# Patient Record
Sex: Male | Born: 1940
Health system: Southern US, Community
[De-identification: ages and names within clinical notes are randomized; demographics above are authoritative.]

## PROBLEM LIST (undated history)

## (undated) DIAGNOSIS — I4891 Unspecified atrial fibrillation: Secondary | ICD-10-CM

## (undated) DIAGNOSIS — I10 Essential (primary) hypertension: Secondary | ICD-10-CM

## (undated) DIAGNOSIS — Z7901 Long term (current) use of anticoagulants: Secondary | ICD-10-CM

## (undated) DIAGNOSIS — D693 Immune thrombocytopenic purpura: Secondary | ICD-10-CM

## (undated) DIAGNOSIS — R0989 Other specified symptoms and signs involving the circulatory and respiratory systems: Secondary | ICD-10-CM

## (undated) DIAGNOSIS — I499 Cardiac arrhythmia, unspecified: Secondary | ICD-10-CM

## (undated) DIAGNOSIS — D696 Thrombocytopenia, unspecified: Secondary | ICD-10-CM

## (undated) DIAGNOSIS — I729 Aneurysm of unspecified site: Secondary | ICD-10-CM

## (undated) DIAGNOSIS — E119 Type 2 diabetes mellitus without complications: Secondary | ICD-10-CM

## (undated) DIAGNOSIS — F419 Anxiety disorder, unspecified: Secondary | ICD-10-CM

## (undated) DIAGNOSIS — Z9289 Personal history of other medical treatment: Secondary | ICD-10-CM

## (undated) DIAGNOSIS — Z87442 Personal history of urinary calculi: Secondary | ICD-10-CM

## (undated) HISTORY — DX: Aneurysm of unspecified site: I72.9

## (undated) HISTORY — DX: Thrombocytopenia, unspecified: D69.6

## (undated) HISTORY — PX: VASECTOMY: SHX75

## (undated) HISTORY — DX: Anxiety disorder, unspecified: F41.9

## (undated) HISTORY — DX: Immune thrombocytopenic purpura: D69.3

## (undated) HISTORY — DX: Long term (current) use of anticoagulants: Z79.01

## (undated) HISTORY — DX: Unspecified atrial fibrillation: I48.91

## (undated) HISTORY — PX: KNEE ARTHROSCOPY: SUR90

## (undated) HISTORY — DX: Essential (primary) hypertension: I10

## (undated) HISTORY — PX: CIRCUMCISION: SUR203

## (undated) HISTORY — DX: Other specified symptoms and signs involving the circulatory and respiratory systems: R09.89

## (undated) HISTORY — DX: Type 2 diabetes mellitus without complications: E11.9

## (undated) HISTORY — DX: Personal history of other medical treatment: Z92.89

---

## 2002-08-07 ENCOUNTER — Other Ambulatory Visit: Admission: RE | Admit: 2002-08-07 | Discharge: 2002-08-07 | Payer: Self-pay | Admitting: Dermatology

## 2003-01-14 ENCOUNTER — Ambulatory Visit (HOSPITAL_COMMUNITY): Admission: RE | Admit: 2003-01-14 | Discharge: 2003-01-14 | Payer: Self-pay | Admitting: *Deleted

## 2003-02-09 ENCOUNTER — Encounter (HOSPITAL_COMMUNITY): Admission: RE | Admit: 2003-02-09 | Discharge: 2003-03-11 | Payer: Self-pay | Admitting: *Deleted

## 2003-02-09 ENCOUNTER — Encounter: Payer: Self-pay | Admitting: *Deleted

## 2003-07-13 ENCOUNTER — Ambulatory Visit (HOSPITAL_COMMUNITY): Admission: RE | Admit: 2003-07-13 | Discharge: 2003-07-13 | Payer: Self-pay | Admitting: General Surgery

## 2004-02-01 ENCOUNTER — Emergency Department (HOSPITAL_COMMUNITY): Admission: EM | Admit: 2004-02-01 | Discharge: 2004-02-01 | Payer: Self-pay | Admitting: Emergency Medicine

## 2004-02-04 ENCOUNTER — Ambulatory Visit (HOSPITAL_COMMUNITY): Admission: RE | Admit: 2004-02-04 | Discharge: 2004-02-04 | Payer: Self-pay | Admitting: *Deleted

## 2004-08-04 ENCOUNTER — Ambulatory Visit (HOSPITAL_COMMUNITY): Admission: RE | Admit: 2004-08-04 | Discharge: 2004-08-04 | Payer: Self-pay | Admitting: Internal Medicine

## 2004-08-05 ENCOUNTER — Ambulatory Visit (HOSPITAL_COMMUNITY): Admission: RE | Admit: 2004-08-05 | Discharge: 2004-08-05 | Payer: Self-pay | Admitting: Internal Medicine

## 2005-05-03 ENCOUNTER — Ambulatory Visit: Payer: Self-pay | Admitting: *Deleted

## 2005-05-19 ENCOUNTER — Encounter (HOSPITAL_COMMUNITY): Admission: RE | Admit: 2005-05-19 | Discharge: 2005-06-18 | Payer: Self-pay | Admitting: Oncology

## 2005-05-19 ENCOUNTER — Ambulatory Visit (HOSPITAL_COMMUNITY): Payer: Self-pay | Admitting: Oncology

## 2005-05-19 ENCOUNTER — Encounter: Admission: RE | Admit: 2005-05-19 | Discharge: 2005-05-19 | Payer: Self-pay | Admitting: Oncology

## 2005-06-29 ENCOUNTER — Encounter (HOSPITAL_COMMUNITY): Admission: RE | Admit: 2005-06-29 | Discharge: 2005-06-29 | Payer: Self-pay | Admitting: Oncology

## 2005-06-29 ENCOUNTER — Encounter: Admission: RE | Admit: 2005-06-29 | Discharge: 2005-06-29 | Payer: Self-pay | Admitting: Oncology

## 2005-08-10 ENCOUNTER — Encounter: Admission: RE | Admit: 2005-08-10 | Discharge: 2005-08-10 | Payer: Self-pay | Admitting: Oncology

## 2005-08-10 ENCOUNTER — Encounter (HOSPITAL_COMMUNITY): Admission: RE | Admit: 2005-08-10 | Discharge: 2005-09-09 | Payer: Self-pay | Admitting: Oncology

## 2005-08-10 ENCOUNTER — Ambulatory Visit (HOSPITAL_COMMUNITY): Payer: Self-pay | Admitting: Oncology

## 2005-10-05 ENCOUNTER — Ambulatory Visit (HOSPITAL_COMMUNITY): Payer: Self-pay | Admitting: Oncology

## 2005-10-05 ENCOUNTER — Encounter (HOSPITAL_COMMUNITY): Admission: RE | Admit: 2005-10-05 | Discharge: 2005-11-04 | Payer: Self-pay | Admitting: Oncology

## 2005-10-05 ENCOUNTER — Encounter: Admission: RE | Admit: 2005-10-05 | Discharge: 2005-10-05 | Payer: Self-pay | Admitting: Oncology

## 2005-11-01 ENCOUNTER — Ambulatory Visit: Payer: Self-pay | Admitting: *Deleted

## 2005-11-30 ENCOUNTER — Encounter (HOSPITAL_COMMUNITY): Admission: RE | Admit: 2005-11-30 | Discharge: 2005-12-30 | Payer: Self-pay | Admitting: Oncology

## 2005-11-30 ENCOUNTER — Encounter: Admission: RE | Admit: 2005-11-30 | Discharge: 2005-11-30 | Payer: Self-pay | Admitting: Oncology

## 2005-12-01 ENCOUNTER — Ambulatory Visit (HOSPITAL_COMMUNITY): Payer: Self-pay | Admitting: Oncology

## 2006-04-12 ENCOUNTER — Ambulatory Visit (HOSPITAL_COMMUNITY): Admission: RE | Admit: 2006-04-12 | Discharge: 2006-04-12 | Payer: Self-pay | Admitting: Internal Medicine

## 2006-04-26 ENCOUNTER — Encounter: Payer: Self-pay | Admitting: Internal Medicine

## 2006-05-02 ENCOUNTER — Ambulatory Visit (HOSPITAL_COMMUNITY): Admission: RE | Admit: 2006-05-02 | Discharge: 2006-05-02 | Payer: Self-pay | Admitting: Interventional Radiology

## 2006-06-04 ENCOUNTER — Encounter (HOSPITAL_COMMUNITY): Admission: RE | Admit: 2006-06-04 | Discharge: 2006-07-04 | Payer: Self-pay | Admitting: Oncology

## 2006-06-04 ENCOUNTER — Ambulatory Visit (HOSPITAL_COMMUNITY): Payer: Self-pay | Admitting: Oncology

## 2006-06-04 ENCOUNTER — Encounter: Admission: RE | Admit: 2006-06-04 | Discharge: 2006-06-04 | Payer: Self-pay | Admitting: Oncology

## 2006-07-03 ENCOUNTER — Ambulatory Visit (HOSPITAL_COMMUNITY): Admission: RE | Admit: 2006-07-03 | Discharge: 2006-07-03 | Payer: Self-pay | Admitting: General Surgery

## 2006-07-03 ENCOUNTER — Encounter (INDEPENDENT_AMBULATORY_CARE_PROVIDER_SITE_OTHER): Payer: Self-pay | Admitting: *Deleted

## 2006-07-12 ENCOUNTER — Ambulatory Visit (HOSPITAL_COMMUNITY): Admission: RE | Admit: 2006-07-12 | Discharge: 2006-07-12 | Payer: Self-pay | Admitting: Internal Medicine

## 2006-11-29 ENCOUNTER — Encounter (HOSPITAL_COMMUNITY): Admission: RE | Admit: 2006-11-29 | Discharge: 2006-12-29 | Payer: Self-pay | Admitting: Oncology

## 2006-11-29 ENCOUNTER — Ambulatory Visit (HOSPITAL_COMMUNITY): Payer: Self-pay | Admitting: Oncology

## 2007-04-23 ENCOUNTER — Ambulatory Visit (HOSPITAL_COMMUNITY): Admission: RE | Admit: 2007-04-23 | Discharge: 2007-04-23 | Payer: Self-pay | Admitting: Internal Medicine

## 2007-05-28 ENCOUNTER — Encounter (HOSPITAL_COMMUNITY): Admission: RE | Admit: 2007-05-28 | Discharge: 2007-06-27 | Payer: Self-pay | Admitting: Oncology

## 2007-05-28 ENCOUNTER — Ambulatory Visit (HOSPITAL_COMMUNITY): Payer: Self-pay | Admitting: Oncology

## 2007-07-04 ENCOUNTER — Ambulatory Visit (HOSPITAL_COMMUNITY): Admission: RE | Admit: 2007-07-04 | Discharge: 2007-07-04 | Payer: Self-pay | Admitting: Internal Medicine

## 2007-11-26 ENCOUNTER — Encounter (HOSPITAL_COMMUNITY): Admission: RE | Admit: 2007-11-26 | Discharge: 2007-12-26 | Payer: Self-pay | Admitting: Oncology

## 2007-11-26 ENCOUNTER — Ambulatory Visit (HOSPITAL_COMMUNITY): Payer: Self-pay | Admitting: Oncology

## 2007-12-31 ENCOUNTER — Ambulatory Visit (HOSPITAL_COMMUNITY): Admission: RE | Admit: 2007-12-31 | Discharge: 2007-12-31 | Payer: Self-pay | Admitting: Cardiovascular Disease

## 2008-01-31 DIAGNOSIS — Z9289 Personal history of other medical treatment: Secondary | ICD-10-CM

## 2008-01-31 HISTORY — DX: Personal history of other medical treatment: Z92.89

## 2008-03-30 ENCOUNTER — Encounter (HOSPITAL_COMMUNITY): Admission: RE | Admit: 2008-03-30 | Discharge: 2008-04-29 | Payer: Self-pay | Admitting: Oncology

## 2008-03-30 ENCOUNTER — Ambulatory Visit (HOSPITAL_COMMUNITY): Payer: Self-pay | Admitting: Oncology

## 2008-04-15 ENCOUNTER — Ambulatory Visit (HOSPITAL_COMMUNITY): Admission: RE | Admit: 2008-04-15 | Discharge: 2008-04-15 | Payer: Self-pay | Admitting: Internal Medicine

## 2008-10-27 ENCOUNTER — Encounter (HOSPITAL_COMMUNITY): Admission: RE | Admit: 2008-10-27 | Discharge: 2008-11-26 | Payer: Self-pay | Admitting: Oncology

## 2008-10-27 ENCOUNTER — Ambulatory Visit (HOSPITAL_COMMUNITY): Payer: Self-pay | Admitting: Oncology

## 2009-04-13 ENCOUNTER — Encounter (HOSPITAL_COMMUNITY): Admission: RE | Admit: 2009-04-13 | Discharge: 2009-04-15 | Payer: Self-pay | Admitting: Oncology

## 2009-04-13 ENCOUNTER — Ambulatory Visit (HOSPITAL_COMMUNITY): Payer: Self-pay | Admitting: Oncology

## 2009-06-22 ENCOUNTER — Encounter (INDEPENDENT_AMBULATORY_CARE_PROVIDER_SITE_OTHER): Payer: Self-pay | Admitting: *Deleted

## 2009-06-22 ENCOUNTER — Ambulatory Visit (HOSPITAL_COMMUNITY): Admission: RE | Admit: 2009-06-22 | Discharge: 2009-06-22 | Payer: Self-pay | Admitting: General Surgery

## 2009-07-01 ENCOUNTER — Ambulatory Visit (HOSPITAL_COMMUNITY): Admission: RE | Admit: 2009-07-01 | Discharge: 2009-07-01 | Payer: Self-pay | Admitting: Urology

## 2009-10-11 ENCOUNTER — Ambulatory Visit (HOSPITAL_COMMUNITY): Payer: Self-pay | Admitting: Oncology

## 2009-10-11 ENCOUNTER — Encounter (HOSPITAL_COMMUNITY): Admission: RE | Admit: 2009-10-11 | Discharge: 2009-11-10 | Payer: Self-pay | Admitting: Oncology

## 2010-04-11 ENCOUNTER — Ambulatory Visit: Payer: Self-pay | Admitting: Oncology

## 2010-04-11 ENCOUNTER — Encounter (HOSPITAL_COMMUNITY)
Admission: RE | Admit: 2010-04-11 | Discharge: 2010-05-11 | Payer: Self-pay | Source: Home / Self Care | Admitting: Oncology

## 2010-04-11 ENCOUNTER — Ambulatory Visit (HOSPITAL_COMMUNITY): Payer: Self-pay | Admitting: Oncology

## 2010-04-12 ENCOUNTER — Ambulatory Visit (HOSPITAL_COMMUNITY): Payer: Self-pay | Admitting: Oncology

## 2010-06-13 ENCOUNTER — Encounter (INDEPENDENT_AMBULATORY_CARE_PROVIDER_SITE_OTHER): Payer: Self-pay | Admitting: *Deleted

## 2010-07-15 DIAGNOSIS — E119 Type 2 diabetes mellitus without complications: Secondary | ICD-10-CM | POA: Insufficient documentation

## 2010-07-15 DIAGNOSIS — R1319 Other dysphagia: Secondary | ICD-10-CM | POA: Insufficient documentation

## 2010-07-15 DIAGNOSIS — Z860100 Personal history of colon polyps, unspecified: Secondary | ICD-10-CM | POA: Insufficient documentation

## 2010-07-15 DIAGNOSIS — I10 Essential (primary) hypertension: Secondary | ICD-10-CM | POA: Insufficient documentation

## 2010-07-15 DIAGNOSIS — M1990 Unspecified osteoarthritis, unspecified site: Secondary | ICD-10-CM | POA: Insufficient documentation

## 2010-07-22 ENCOUNTER — Ambulatory Visit: Admit: 2010-07-22 | Payer: Self-pay | Admitting: Gastroenterology

## 2010-08-16 NOTE — Letter (Signed)
Summary: New Patient letter  [facility]  [address]   [place], [place] [address]   Phone: [phone]  Fax: [phone]       06/13/2010 MRN: [id number]  Jason Alvarez [address] [place], [place]  [address]  Dear Jason Alvarez,  Welcome to the Gastroenterology Division at [facility].    You are scheduled to see Dr. Jarold Motto on 07/22/2010 at 8:30 am on the 3rd floor at [facility], [address]  We ask that you try to arrive at our office 15 minutes prior to your appointment time to allow for check-in.  We would like you to complete the enclosed self-administered evaluation form prior to your visit and bring it with you on the day of your appointment.  We will review it with you.  Also, please bring a complete list of all your medications or, if you prefer, bring the medication bottles and we will list them.  Please bring your insurance card so that we may make a copy of it.  If your insurance requires a referral to see a specialist, please bring your referral form from your primary care physician.  Co-payments are due at the time of your visit and may be paid by cash, check or credit card.     Your office visit will consist of a consult with your physician (includes a physical exam), any laboratory testing he/she may order, scheduling of any necessary diagnostic testing (e.g. x-ray, ultrasound, CT-scan), and scheduling of a procedure (e.g. Endoscopy, Colonoscopy) if required.  Please allow enough time on your schedule to allow for any/all of these possibilities.    If you cannot keep your appointment, please call [phone] to cancel or reschedule prior to your appointment date.  This allows [place] the opportunity to schedule an appointment for another patient in need of care.  If you do not cancel or reschedule by 5 p.m. the business day prior to your appointment date, you will be charged a $50.00 late cancellation/no-show fee.    Thank you for choosing  [facility] for your medical needs.  We appreciate the opportunity to care for you.  Please visit [place] at our website  to learn more about our practice.                     Sincerely,                                                             The Gastroenterology Division

## 2010-09-29 LAB — CBC
Hemoglobin: 14.3 g/dL (ref 13.0–17.0)
MCH: 31.5 pg (ref 26.0–34.0)
MCHC: 34.6 g/dL (ref 30.0–36.0)
MCV: 91.2 fL (ref 78.0–100.0)

## 2010-10-09 LAB — CBC
MCHC: 35 g/dL (ref 30.0–36.0)
MCV: 90.3 fL (ref 78.0–100.0)
Platelets: 123 10*3/uL — ABNORMAL LOW (ref 150–400)
RDW: 13.2 % (ref 11.5–15.5)
WBC: 5.2 10*3/uL (ref 4.0–10.5)

## 2010-10-10 ENCOUNTER — Encounter (HOSPITAL_COMMUNITY): Payer: Medicare Other | Attending: Oncology

## 2010-10-10 ENCOUNTER — Other Ambulatory Visit (HOSPITAL_COMMUNITY): Payer: Medicare Other

## 2010-10-10 DIAGNOSIS — D693 Immune thrombocytopenic purpura: Secondary | ICD-10-CM

## 2010-10-17 LAB — CBC
Platelets: 117 10*3/uL — ABNORMAL LOW (ref 150–400)
RDW: 12.6 % (ref 11.5–15.5)
WBC: 4.3 10*3/uL (ref 4.0–10.5)

## 2010-10-17 LAB — COMPREHENSIVE METABOLIC PANEL
AST: 23 U/L (ref 0–37)
Albumin: 4 g/dL (ref 3.5–5.2)
Alkaline Phosphatase: 59 U/L (ref 39–117)
Chloride: 103 mEq/L (ref 96–112)
GFR calc Af Amer: >60 mL/min (ref >60)
Potassium: 4.6 mEq/L (ref 3.5–5.1)
Sodium: 144 mEq/L (ref 135–145)
Total Bilirubin: 1 mg/dL (ref 0.3–1.2)
Total Protein: 7.5 g/dL (ref 6.0–8.3)

## 2010-10-17 LAB — GLUCOSE, CAPILLARY
Glucose-Capillary: 110 mg/dL — ABNORMAL HIGH (ref 70–99)
Glucose-Capillary: 155 mg/dL — ABNORMAL HIGH (ref 70–99)
Glucose-Capillary: 158 mg/dL — ABNORMAL HIGH (ref 70–99)
Glucose-Capillary: 216 mg/dL — ABNORMAL HIGH (ref 70–99)

## 2010-10-17 LAB — PROTIME-INR: Prothrombin Time: 13.5 seconds (ref 11.6–15.2)

## 2010-10-18 LAB — CLOTEST (H. PYLORI), BIOPSY: Helicobacter screen: NEGATIVE

## 2010-10-21 LAB — CBC
HCT: 43.2 % (ref 39.0–52.0)
Hemoglobin: 14.9 g/dL (ref 13.0–17.0)
MCHC: 34.5 g/dL (ref 30.0–36.0)
Platelets: 127 10*3/uL — ABNORMAL LOW (ref 150–400)
RDW: 12.9 % (ref 11.5–15.5)

## 2010-10-26 LAB — CBC
Hemoglobin: 14.4 g/dL (ref 13.0–17.0)
MCHC: 34.7 g/dL (ref 30.0–36.0)
RDW: 13.1 % (ref 11.5–15.5)

## 2010-12-02 NOTE — H&P (Signed)
NAMEEDMUND, HOLCOMB               ACCOUNT NO.:  [id number]   MEDICAL RECORD NO.:  [id number]          PATIENT TYPE:  AMB   LOCATION:  DAY                           FACILITY:  [facility]   PHYSICIAN:  Dalia Heading, M.D.  DATE OF BIRTH:  1941-07-09   DATE OF ADMISSION:  07/03/2006  DATE OF DISCHARGE:  LH                              HISTORY & PHYSICAL   CHIEF COMPLAINT:  Colon polyp.   HISTORY OF PRESENT ILLNESS:  The patient is a 70 year old white male who  is referred for endoscopic evaluation.  He needs a follow-up colonoscopy  due to a history of colon polyps.  He had an ascending colon polyp in  2004, which was negative for malignancy.  No abdominal pain, weight  loss, nausea, vomiting, diarrhea, constipation, melena, or hematochezia  have been noted.   PAST MEDICAL HISTORY:  Hypertension and non-insulin-dependent diabetes  mellitus.  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   PAST SURGICAL HISTORY:  Unremarkable.   CURRENT MEDICATIONS:  Hydrochlorothiazide, Altace, atenolol, Xanax,  glipizide/metformin, baby aspirin (which he is holding).   ALLERGIES:  PENICILLIN.   REVIEW OF SYSTEMS:  Noncontributory.   PHYSICAL EXAMINATION:  GENERAL:  The patient is a well-developed, well-  nourished white male in no acute distress.  LUNGS:  Clear to auscultation with equal breath sounds bilaterally.  HEART:  A regular rate and rhythm without S3, S4, or murmurs.  ABDOMEN:  Soft, nontender, nondistended.  No hepatosplenomegaly or  masses are noted.  RECTAL:  Examination was deferred to the procedure.   IMPRESSION:  History of colon polyp.   PLAN:  The patient is scheduled for a colonoscopy on July 03, 2006.  The risks and benefits of the procedure including bleeding and  perforation were fully explained to the patient, who gave informed  consent.      Dalia Heading, M.D.  Electronically Signed     MAJ/MEDQ  D:   06/19/2006  T:  06/20/2006  Job:  [id number]   cc:   [facility]  Fax: [phone]   Madelin Rear. Sherwood Gambler, MD  Fax: [phone]

## 2010-12-02 NOTE — Procedures (Signed)
NAME:  Jason Alvarez, Jason Alvarez NO.:  [id number]   MEDICAL RECORD NO.:  [id number]                   PATIENT TYPE:  OUT   LOCATION:  RAD                                  FACILITY:  [facility]   PHYSICIAN:  Vida Roller, M.D.                DATE OF BIRTH:  [birth year]   DATE OF PROCEDURE:  02/04/2004  DATE OF DISCHARGE:                                  ECHOCARDIOGRAM   REFERRING PHYSICIAN:  Patrica Duel, M.D.   TAPE NUMBER:  [id number]   TAPE COUNT:  [phone]   CLINICAL INFORMATION:  [age]-year-old male with atrial fibrillation.   PREVIOUS STUDY:  No previous echo's.   TECHNICAL QUALITY:  Adequate.   M-MODE:  AORTA:  36 mm  (<4.0)  LEFT ATRIUM:  47 mm  (<4.0)  SEPTUM:  18 mm  (0.7-1.1)  POSTERIOR WALL:  16 mm  (0.7-1.1)  LV-DIASTOLE:  39 mm  (<5.7)  LV-SYSTOLE:  29 mm  (<4.0)  E-SEPTAL:  (<0.5)  RV-DIASTOLE:  (<2.5)  IVC:  (<2.0)   TWO-DIMENSIONAL AND DOPPLER IMAGING:  The left ventricle is normal size with  preserved left ventricular systolic function, estimated ejection fraction 55-  60%, there are no obvious wall motion abnormalities, there is moderate  concentric left ventricular hypertrophy with a slight predominance in the  septum.  Diastolic function was not assessed.   The right ventricle is normal size with normal systolic function.   Both atria appear to be enlarged, left greater than right; there is no  obvious atrial septal defect.   The aortic valve is sclerotic with no stenosis or regurgitation.   The mitral valve is morphologically unremarkable with trivial insufficiency,  no stenosis is seen.   The tricuspid valve is morphologically unremarkable with trivial  insufficiency, no stenosis is seen.   Pulmonic valve not well seen.   Assenting aorta not well seen.   Pericardial structures appear normal.   Inferior vena cava not well seen.      ___________________________________________     Vida Roller, M.D.   JH/MEDQ  D:  02/05/2004  T:  02/05/2004  Job:  [id number]

## 2010-12-02 NOTE — Consult Note (Signed)
NAME:  Jason Alvarez, Jason Alvarez               ACCOUNT NO.:  [id number]   MEDICAL RECORD NO.:  [id number]          PATIENT TYPE:  OUT   LOCATION:  XRAY                         FACILITY:  [facility]   PHYSICIAN:  Sanjeev K. Deveshwar, M.D.DATE OF BIRTH:  29-Oct-1940   DATE OF CONSULTATION:  04/26/2006  DATE OF DISCHARGE:                                   CONSULTATION   CHIEF COMPLAINT:  Abnormal MRI/MRA.   HISTORY OF PRESENT ILLNESS:  This is a very pleasant 70 year old male who  had been experiencing some problems with his balance.  He saw his primary  care physician, Dr. Sherwood Gambler, who ordered an MRI/MRA on April 06, 2006,  that showed a question of a basilar tip artery aneurysm.  This was performed  at the [facility].  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  The patient also had a carotid  ultrasound performed which showed less than 40% stenosis of the internal  carotid arteries bilaterally.  A cerebral angiogram has been recommended.  The patient presents today with his wife to discuss further recommendations.   PAST MEDICAL HISTORY:  1. Diabetes mellitus controlled with oral agents.  2. Hypertension.  3. He has a previous history of an episode of atrial fibrillation followed      by [facility], apparently he only had one episode and was not      treated with Coumadin.  4. He has a remote history gastroesophageal reflux disease.  5. He had a 2-D echo performed February 04, 2004, that revealed an ejection      fraction of 55-60% with moderate concentric LVH but no wall motion      abnormalities.  6. He has a longstanding history of thrombocytopenia followed by Dr.      Mariel Sleet.  This is still being evaluated and felt possibly secondary      to medications.   PAST SURGICAL HISTORY:  1. The patient had a vasectomy approximately 30 years ago.  2. He had a  surgical procedure for a renal calculi.  3. He has had arthroscopic knee surgery.  He denies problems with anesthesia.   ALLERGIES:  He states he is allergic to PENICILLIN and intolerant to Actos.   CURRENT MEDICATIONS:  Hydrochlorothiazide, Altace, atenolol, aspirin 81 mg  daily, Xanax and a combination of glipizide and metformin for his diabetes.   SOCIAL HISTORY:  The patient is married.  He has two grown children.  [place]  He does not use alcohol or tobacco.  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   FAMILY HISTORY:  His mother died in her 67s from melanoma.  His father died  in his 15s from prostate CA.  He had a brother who died from lung cancer in  his 67s.   IMPRESSION AND PLAN:  Dr. Corliss Skains reviewed the results of the patient's  recent MRI/MRA of the brain as well as the MRA of the neck.  He went over  the images with the patient and his wife.  He pointed out the areas of  concern.  As noted, there were two different problems, one being a possible  basilar tip aneurysm, the other being a stenosis of the vertebrobasilar  junction.  Dr. Corliss Skains told the patient and his wife that he could not be  sure of the severity of either issue until a cerebral angiogram had been  performed.  Treatment options were discussed including continued monitoring  with conservative measures or cerebral angiogram and possible intervention.  The procedures were described in detail along with the risks and benefits.  The patient and his wife would like to proceed with a cerebral angiogram for  further diagnostic purposes, followed by intervention at a later date if  felt to be safe and indicated.  We will schedule the patient for his  cerebral angiogram  Wednesday, May 02, 2006.   Greater than 1 hour was spent on this consult.  All of their questions were  answered.  They have a good understanding of the situation.  There were also  given some written  material to review at home.  The patient was told to hold  his glipizide and metformin the morning of the angiogram and he was to hold  the metformin for 2 days afterwards as well.      Delton See, P.A.    ______________________________  Grandville Silos. Corliss Skains, M.D.    DR/MEDQ  D:  04/26/2006  T:  04/27/2006  Job:  [id number]

## 2010-12-02 NOTE — Procedures (Signed)
   NAME:  Jason Alvarez, Jason Alvarez NO.:  [id number]   MEDICAL RECORD NO.:  [id number]                   PATIENT TYPE:   LOCATION:                                       FACILITY:   PHYSICIAN:  Vida Roller, M.D.                DATE OF BIRTH:   DATE OF PROCEDURE:  DATE OF DISCHARGE:                                    STRESS TEST   EXERCISE CARDIOLITE:  Jason Alvarez is a 70 year old male with no known  coronary artery disease, with history of mildly abnormal EKG with stress  test approximately nine years ago.  His cardiac risk factors include  diabetes, hypertension, male sex, age.   BASELINE DATA:  Resting heart rate was 112 beats per minute; resting blood  pressure is 172/90.   Patient exercised for a total of 6 minutes in Bruce protocol, stage I.  Stage was felt secondary to elevated blood pressure.  METS achieved were  4.6.  Maximum heart rate was 155 beats per minute, which is 98% of predicted  maximum.  Maximum blood pressure was 218/88.  This resolved down to 174/80  in recovery.  Test was stopped secondary to elevated systolic blood pressure  at 218.   EKG showed no ischemic changes and few PVCs.   Final images and results are pending M.D. review.     Amy Mercy Riding, P.A. [facility]                     Vida Roller, M.D.    AB/MEDQ  D:  02/09/2003  T:  02/09/2003  Job:  [id number]

## 2010-12-02 NOTE — H&P (Signed)
NAME:  Jason Alvarez, Jason Alvarez NO.:  [id number]   MEDICAL RECORD NO.:  [id number]                  PATIENT TYPE:   LOCATION:                                       FACILITY:   PHYSICIAN:  Dalia Heading, M.D.               DATE OF BIRTH:  Jun 28, 1941   DATE OF ADMISSION:  DATE OF DISCHARGE:                                HISTORY & PHYSICAL   CHIEF COMPLAINT:  Hematochezia.   HISTORY OF PRESENT ILLNESS:  Patient is a 70 year old white male who is  referred for endoscopic evaluation.  He needs a colonoscopy due to a history  of intermittent hematochezia.  No abdominal pain, weight loss, nausea,  vomiting, diarrhea, constipation, or melena have been noted.  He had never  had a colonoscopy.  There is no family history of colon carcinoma.  There is  a questionable history of hemorrhoidal disease.   PAST MEDICAL HISTORY:  Includes hypertension and noninsulin dependent  diabetes mellitus.   PAST SURGICAL HISTORY:  Unremarkable.   CURRENT MEDICATIONS:  Blood pressure pill, hydrochlorothiazide, atenolol,  Metaglip, Xanax.   ALLERGIES:  NO KNOWN DRUG ALLERGIES.   REVIEW OF SYSTEMS:  Noncontributory.   PHYSICAL EXAMINATION:  Patient is a well-developed, well-nourished white  male in no acute distress.  He is afebrile.  Vital signs are stable.  LUNGS:  Clear to auscultation with equal breath sounds bilaterally.  HEART EXAMINATION:  Reveals a regular rate and rhythm without S3, S4, or  murmurs.  ABDOMEN:  Soft, nontender, nondistended.  No hepatosplenomegaly, masses, or  hernias are identified.  RECTAL EXAMINATION:  Deferred due to the procedure.   IMPRESSION:  Hematochezia.   PLAN:  The patient is scheduled for a colonoscopy on July 13, 2003.  The  risks and benefits of the procedure including bleeding and perforation were  fully explained to the patient, gave informed consent.  A CoLyte preparation  has been prescribed.     ___________________________________________                                         Dalia Heading, M.D.   MAJ/MEDQ  D:  07/07/2003  T:  07/07/2003  Job:  [id number]   cc:   Patrica Duel, M.D.  [address], [address]  [place]  [place] [address]  Fax: [phone]

## 2011-04-10 ENCOUNTER — Encounter (HOSPITAL_COMMUNITY): Payer: Medicare Other | Attending: Oncology

## 2011-04-10 DIAGNOSIS — D696 Thrombocytopenia, unspecified: Secondary | ICD-10-CM

## 2011-04-10 DIAGNOSIS — E119 Type 2 diabetes mellitus without complications: Secondary | ICD-10-CM | POA: Insufficient documentation

## 2011-04-10 DIAGNOSIS — D693 Immune thrombocytopenic purpura: Secondary | ICD-10-CM | POA: Insufficient documentation

## 2011-04-10 LAB — CBC
Hemoglobin: 14 g/dL (ref 13.0–17.0)
Platelets: 122 10*3/uL — ABNORMAL LOW (ref 150–400)
RBC: 4.53 MIL/uL (ref 4.22–5.81)
WBC: 5.1 10*3/uL (ref 4.0–10.5)

## 2011-04-10 LAB — DIFFERENTIAL
Lymphocytes Relative: 43 % (ref 12–46)
Lymphs Abs: 2.2 10*3/uL (ref 0.7–4.0)
Monocytes Relative: 10 % (ref 3–12)
Neutro Abs: 2.2 10*3/uL (ref 1.7–7.7)
Neutrophils Relative %: 42 % — ABNORMAL LOW (ref 43–77)

## 2011-04-10 NOTE — Progress Notes (Signed)
Labs drawn today for cbc/diff 

## 2011-04-12 ENCOUNTER — Encounter (HOSPITAL_COMMUNITY): Payer: Self-pay | Admitting: Oncology

## 2011-04-12 ENCOUNTER — Encounter (HOSPITAL_BASED_OUTPATIENT_CLINIC_OR_DEPARTMENT_OTHER): Payer: Medicare Other | Admitting: Oncology

## 2011-04-12 VITALS — BP 188/83 | HR 61 | Temp 97.5°F | Ht 75.0 in | Wt 234.0 lb

## 2011-04-12 DIAGNOSIS — D696 Thrombocytopenia, unspecified: Secondary | ICD-10-CM

## 2011-04-12 DIAGNOSIS — D693 Immune thrombocytopenic purpura: Secondary | ICD-10-CM

## 2011-04-12 DIAGNOSIS — Z7901 Long term (current) use of anticoagulants: Secondary | ICD-10-CM

## 2011-04-12 NOTE — Progress Notes (Signed)
This office note has been dictated.

## 2011-04-12 NOTE — Patient Instructions (Signed)
[facility]  Discharge Instructions  RECOMMENDATIONS MADE BY THE CONSULTANT AND ANY TEST RESULTS WILL BE SENT TO YOUR REFERRING DOCTOR.   EXAM FINDINGS BY MD TODAY AND SIGNS AND SYMPTOMS TO REPORT TO CLINIC OR PRIMARY MD: Doing well.  Let us know if you are going to see MD about your varicose vein  MEDICATIONS PRESCRIBED: none     SPECIAL INSTRUCTIONS/FOLLOW-UP: Lab work Needed in 6 months and to see Dr. Lytle Butte in 1 year.   I acknowledge that I have been informed and understand all the instructions given to me and received a copy. I do not have any more questions at this time, but understand that I may call the Specialty Clinic at [facility] at [phone] during business hours should I have any further questions or need assistance in obtaining follow-up care.    __________________________________________  _____________  __________ Signature of Patient or Authorized Representative            Date                   Time    __________________________________________ Nurse's Signature

## 2011-04-12 NOTE — Progress Notes (Signed)
CC:   Jason Alvarez. Sherwood Gambler, MD Nanetta Batty, M.D.  DIAGNOSES: 1. Low grade thrombocytopenia consistent with chronic ITP not in need     of therapy. 2. Atrial fibrillation on Coumadin. 3. Hypertension. 4. Diabetes mellitus type 2. 5. Varicose veins on the right leg which are painful and quite     significantly at times. 6. Chronic anxiety on p.r.n. Xanax. 7. 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     Corliss Skains in the past.  [name]'s platelets are very, very stable still.  He has been in the 115- 140,000 range since I met him.  The earliest platelet count that is low that I have is from July 2005 and it is the same then as is now essentially.  So the ranges remain the same.  Hemoglobin, hematocrit white count perfectly normal.  Differential unremarkable.  He is really complaining of pain in the right leg from the varicosities. I examined his leg.  He has a significant of course group of varicose veins on the right leg above and below the knee.  These are very tender he states at times.  He has no petechiae on his legs.  No petechiae intraorally and no petechiae conjunctivally.  So what we talked about was getting a consultation with Dr. Jerilee Field who he actually knows due to his mother-in-law who was just recently operated on by Dr. Hart Rochester and had a very good outcome.  So I think a consultation with him would be reasonable for [name] and he will think about it and either have Dr. Sherwood Gambler make that referral or just call in himself and he can use my name.  We will see him in 6 months and 12 months for CBC.  I will see him in a year, sooner if need be, but right now he needs no therapy and has not ever needed therapy.    ______________________________ Ladona Horns. Mariel Sleet, MD ESN/MEDQ  D:  04/12/2011  T:  04/12/2011  Job:  [id number]

## 2011-04-19 LAB — DIFFERENTIAL
Eosinophils Absolute: 0.3
Lymphs Abs: 1.5
Monocytes Absolute: 0.4
Monocytes Relative: 8
Neutrophils Relative %: 52

## 2011-04-19 LAB — CBC
HCT: 39.3
Hemoglobin: 13.7
MCHC: 34.9
MCV: 90.8
RBC: 4.32
WBC: 4.5

## 2011-04-25 LAB — CBC
MCHC: 33.7
MCV: 90.5
RBC: 4.71
RDW: 13.1

## 2011-08-21 ENCOUNTER — Encounter (HOSPITAL_COMMUNITY): Payer: Self-pay | Admitting: Pharmacy Technician

## 2011-08-29 ENCOUNTER — Encounter (HOSPITAL_COMMUNITY)
Admission: RE | Disposition: A | Discharge status: Home / Self Care | Payer: Self-pay | Source: Ambulatory Visit | Attending: Ophthalmology

## 2011-08-29 ENCOUNTER — Ambulatory Visit (HOSPITAL_COMMUNITY)
Admission: RE | Admit: 2011-08-29 | Discharge: 2011-08-29 | Disposition: A | Discharge status: Home / Self Care | Payer: Medicare Other | Source: Ambulatory Visit | Attending: Ophthalmology | Admitting: Ophthalmology

## 2011-08-29 DIAGNOSIS — H26499 Other secondary cataract, unspecified eye: Secondary | ICD-10-CM | POA: Insufficient documentation

## 2011-08-29 SURGERY — TREATMENT, USING YAG LASER
Anesthesia: LOCAL | Site: Eye | Laterality: Left

## 2011-08-29 MED ORDER — CYCLOPENTOLATE-PHENYLEPHRINE 0.2-1 % OP SOLN
OPHTHALMIC | Status: AC
  Administered 2011-08-29: 1 [drp] via OPHTHALMIC
  Filled 2011-08-29: qty 2

## 2011-08-29 MED ORDER — CYCLOPENTOLATE-PHENYLEPHRINE 0.2-1 % OP SOLN
1.0000 [drp] | OPHTHALMIC | Status: AC
  Administered 2011-08-29 (×3): 1 [drp] via OPHTHALMIC

## 2011-08-29 NOTE — Discharge Instructions (Signed)
[name]  08/29/2011     Instructions    Activity: No Restrictions.   Diet: Resume Diet you were on at home.   Pain Medication: Tylenol if Needed.   CONTACT YOUR DOCTOR IF YOU HAVE PAIN, REDNESS IN YOUR EYE, OR DECREASED VISION.   Follow-up:today between 1 and 2pm with Mark T. Nile Riggs, MD.   Dr. Lahoma Crocker: [phone]  Dr. Lita Mains: [phone]  Dr. Alto Denver: [phone]   If you find that you cannot contact your physician, but feel that your signs and   Symptoms warrant a physician's attention, call the Emergency Room at   [phone].   Othern/a.

## 2011-08-29 NOTE — Brief Op Note (Signed)
ATTILA MCCARTHY 08/29/2011  Laurianne Floresca T. Nile Riggs, MD  Yag Laser Self Test Completedyes. Procedure: Posterior Capsulotomy, left eye.  Eye Protection Worn by Staff yes. Laser In Use Sign on Door yes.  Laser: Nd:YAG Spot Size: Fixed Burst Mode: III Power Setting: 3.4 mJ/burst  Number of shots: 24 Total energy delivered:80.0 mJ  Patency of the peripheral iridotomy was confirmed visually.  The patient tolerated the procedure without difficulty. No complications were encountered.    The patient was discharged home with the instructions to continue all his current glaucoma medications, if any.   Patient instructed to go to office at 0100 for intraocular pressure check.  Patient verbalizes understanding of discharge instructions yes.

## 2011-08-29 NOTE — H&P (Signed)
The patient was re examined and there is no change in the patients condition since the original H and P. 

## 2011-09-08 ENCOUNTER — Encounter: Payer: Self-pay | Admitting: Vascular Surgery

## 2011-09-11 ENCOUNTER — Encounter (INDEPENDENT_AMBULATORY_CARE_PROVIDER_SITE_OTHER): Payer: 59 | Admitting: *Deleted

## 2011-09-11 ENCOUNTER — Encounter: Payer: Self-pay | Admitting: Vascular Surgery

## 2011-09-11 ENCOUNTER — Ambulatory Visit (INDEPENDENT_AMBULATORY_CARE_PROVIDER_SITE_OTHER): Payer: 59 | Admitting: Vascular Surgery

## 2011-09-11 VITALS — BP 196/76 | HR 59 | Resp 20 | Ht 75.0 in | Wt 226.0 lb

## 2011-09-11 DIAGNOSIS — I83893 Varicose veins of bilateral lower extremities with other complications: Secondary | ICD-10-CM

## 2011-09-11 NOTE — Progress Notes (Signed)
Subjective:     Patient ID: Jason Alvarez, male   DOB: [birth year], 71 y.o.   MRN: [id number]  HPI this 71 year old male patient was self-referred for severe venous insufficiency of the right leg. He has had bulging varicosities in the medial thigh and calf with severe skin changes for the last 20-30 years which have become progressively worse. He noticed a swelling in the right leg as the day progresses. He is not wear elastic compression stockings nor elevate his legs nor take pain medication. He has had no history of stasis ulcers thrombophlebitis or DVT. He develops a heavy aching discomfort as the day progresses accompanied with the edema. Noticed the skin becoming more dark over the last year or 2.  Past Medical History  Diagnosis Date  . Thrombocytopenia   . ITP (idiopathic thrombocytopenic purpura)   . HTN (hypertension)   . DM (diabetes mellitus)   . A-fib   . Chronic anticoagulation   . Aneurysm   . Anxiety     History  Substance Use Topics  . Smoking status: Never Smoker   . Smokeless tobacco: Never Used  . Alcohol Use: No    Family History  Problem Relation Age of Onset  . Prostate cancer Father   . Cancer Sister     breast  . Cancer Brother     lung    Allergies  Allergen Reactions  . Penicillins Other (See Comments)    Childhood allergy    Current outpatient prescriptions:ALPRAZolam (XANAX) 1 MG tablet, Take 1 mg by mouth as needed. For anxiety, Disp: , Rfl: ;  glipiZIDE-metformin (METAGLIP) 5-500 MG per tablet, Take 1 tablet by mouth 2 (two) times daily before a meal.  , Disp: , Rfl: ;  hydrochlorothiazide (HYDRODIURIL) 25 MG tablet, Take 25 mg by mouth daily.  , Disp: , Rfl:  HYDROcodone-acetaminophen (VICODIN) 5-500 MG per tablet, Take 1 tablet by mouth every 8 (eight) hours as needed. For pain, Disp: , Rfl: ;  metoprolol succinate (TOPROL-XL) 50 MG 24 hr tablet, Take 50 mg by mouth every evening., Disp: , Rfl: ;  ramipril (ALTACE) 5 MG tablet, Take 5-10 mg by  mouth 2 (two) times daily. 10 mg in the am & 5 mg in the pm, Disp: , Rfl:  warfarin (COUMADIN) 5 MG tablet, Take 5-7.5 mg by mouth daily. 7.5 mg daily for 5 days a week and 5 mg on Mon and Fri, Disp: , Rfl:   BP 196/76  Pulse 59  Resp 20  Ht 6\' 3"  (1.905 m)  Wt 226 lb (102.513 kg)  BMI 28.25 kg/m2  Body mass index is 28.25 kg/(m^2).         Review of Systems     Objective:   Physical Exam blood pressure 196/76 heart rate 59 respirations 20 Gen.-alert and oriented x3 in no apparent distress HEENT normal for age Lungs no rhonchi or wheezing Cardiovascular regular rhythm no murmurs carotid pulses 3+ palpable no bruits audible Abdomen soft nontender no palpable masses Musculoskeletal free of  major deformities Skin clear -no rashes Neurologic normal Lower extremities 3+ femoral and dorsalis pedis pulses palpable bilaterally . The left leg has multiple bulging varicosities in the medial thigh over about an 8-10 cm in diameter area. There is hyperpigmentation and bulging varicosities from the knee to the ankle. There is one to 2+ edema skin is very thickened and scaly in appearance. There is no active ulceration noted. Right lower extremity is free of varicosities but does  have some diffuse reticular veins in the lower third of the leg  Today I ordered a venous duplex exam of the right leg which I reviewed and interpreted. There is gross reflux in the right great saphenous vein supplying these varicosities and there is no DVT.    Assessment:     Severe venous insufficiency right leg with bulging varicosities secondary to gross reflux right great saphenous vein and the 2 valvular incompetence. Severe skin changes affecting his daily living.    Plan:     #1 [place] elastic compression stockings 20-30 mm gradient #2 a light legs as much as possible #3 ibuprofen on a daily basis #4 patient is on chronic warfarin for intermittent atrial fib. #5 return in 3 months. Patient will  benefit from laser ablation right great saphenous vein and greater than 20 stab phlebectomy. If not dramatic improvement he will need this in the near future following his return appointment

## 2011-09-18 NOTE — Procedures (Unsigned)
LOWER EXTREMITY VENOUS REFLUX EXAM  INDICATION:  Varicose veins with other complication, patient complains of right lower extremity swelling.  EXAM:  Using color-flow imaging and pulse Doppler spectral analysis, the right common femoral, femoral, popliteal, posterior tibial, great and small saphenous veins were evaluated.  There is evidence suggesting deep venous reflux of >500 milliseconds noted throughout the right lower extremity.  The right saphenofemoral junction and nontortuous greater saphenous vein demonstrate reflux of >500 milliseconds with calibers as described below.   The right proximal small saphenous vein demonstrates competency.  GSV Diameter (used if found to be incompetent only)                                           Right    Left Proximal Greater Saphenous Vein           0.77 cm  cm Proximal-to-mid-thigh                     cm       cm Mid thigh                                 0.71 cm  cm Mid-distal thigh                          cm       cm Distal thigh                              0.40 cm  cm Knee                                      0.52 cm  cm  IMPRESSION:  Right great saphenous vein and deep system reflux is noted, as described above.  ___________________________________________ Quita Skye. Hart Rochester, M.D.  CH/MEDQ  D:  09/11/2011  T:  09/11/2011  Job:  [id number]

## 2011-10-10 ENCOUNTER — Other Ambulatory Visit (HOSPITAL_COMMUNITY): Payer: Medicare Other

## 2011-10-23 ENCOUNTER — Encounter (HOSPITAL_COMMUNITY): Payer: Medicare Other | Attending: Oncology

## 2011-10-23 DIAGNOSIS — D693 Immune thrombocytopenic purpura: Secondary | ICD-10-CM

## 2011-10-23 DIAGNOSIS — D696 Thrombocytopenia, unspecified: Secondary | ICD-10-CM | POA: Insufficient documentation

## 2011-10-23 LAB — CBC
MCH: 30.4 pg (ref 26.0–34.0)
MCV: 90.2 fL (ref 78.0–100.0)
Platelets: 124 10*3/uL — ABNORMAL LOW (ref 150–400)
RDW: 12.6 % (ref 11.5–15.5)
WBC: 4.6 10*3/uL (ref 4.0–10.5)

## 2011-10-23 NOTE — Progress Notes (Signed)
Labs drawn today for cbc 

## 2012-04-08 ENCOUNTER — Encounter (HOSPITAL_COMMUNITY): Payer: 59 | Attending: Oncology

## 2012-04-08 DIAGNOSIS — I83893 Varicose veins of bilateral lower extremities with other complications: Secondary | ICD-10-CM | POA: Insufficient documentation

## 2012-04-08 DIAGNOSIS — D696 Thrombocytopenia, unspecified: Secondary | ICD-10-CM | POA: Insufficient documentation

## 2012-04-08 DIAGNOSIS — I4891 Unspecified atrial fibrillation: Secondary | ICD-10-CM | POA: Insufficient documentation

## 2012-04-08 DIAGNOSIS — E119 Type 2 diabetes mellitus without complications: Secondary | ICD-10-CM | POA: Insufficient documentation

## 2012-04-08 DIAGNOSIS — I10 Essential (primary) hypertension: Secondary | ICD-10-CM | POA: Insufficient documentation

## 2012-04-08 LAB — CBC
Hemoglobin: 13.9 g/dL (ref 13.0–17.0)
MCHC: 33.6 g/dL (ref 30.0–36.0)
RDW: 12.7 % (ref 11.5–15.5)
WBC: 3.5 10*3/uL — ABNORMAL LOW (ref 4.0–10.5)

## 2012-04-10 ENCOUNTER — Encounter (HOSPITAL_BASED_OUTPATIENT_CLINIC_OR_DEPARTMENT_OTHER): Payer: 59 | Admitting: Oncology

## 2012-04-10 ENCOUNTER — Encounter (HOSPITAL_COMMUNITY): Payer: Self-pay | Admitting: Oncology

## 2012-04-10 ENCOUNTER — Other Ambulatory Visit (HOSPITAL_COMMUNITY): Payer: 59

## 2012-04-10 VITALS — BP 197/69 | HR 56 | Temp 97.5°F | Resp 18 | Wt 232.2 lb

## 2012-04-10 DIAGNOSIS — D696 Thrombocytopenia, unspecified: Secondary | ICD-10-CM

## 2012-04-10 DIAGNOSIS — F411 Generalized anxiety disorder: Secondary | ICD-10-CM

## 2012-04-10 DIAGNOSIS — D693 Immune thrombocytopenic purpura: Secondary | ICD-10-CM | POA: Insufficient documentation

## 2012-04-10 DIAGNOSIS — I671 Cerebral aneurysm, nonruptured: Secondary | ICD-10-CM

## 2012-04-10 HISTORY — DX: Immune thrombocytopenic purpura: D69.3

## 2012-04-10 NOTE — Progress Notes (Signed)
Cassell Smiles., MD [address] [place] [address]  1. Chronic ITP (idiopathic thrombocytopenia)     CURRENT THERAPY: Observation with lab work.  INTERVAL HISTORY: Jason Alvarez 71 y.o. male returns for  regular  visit for followup of Low grade thrombocytopenia consistent with chronic ITP not in need of therapy.    Odas's platelets are very, very stable still. He has been in the 109- 140,000 range since he presented to the clinic.  The earliest platelet count that is low that I have is from July 2005 and it is the same then as is now essentially. So the ranges remain the same. Hemoglobin and hematocrit are perfectly normal. Differential unremarkable.  He does have a mildly low WBC count on 04/08/2012.  Jason Alvarez is doing well.  He is on Coumadin for paryoxysmal atrial fibrillation.  He is having his INR monitored elsewhere.  Unfortunately, Jason Alvarez brother passed from pancreatic cancer in April 2013.  He reports that he has a strong family history of cancer and he was telling me briefly about it.  His sister has a low platelet count recently in the setting of a UTI and I provided him some education on that.  I personally reviewed and went over laboratory results with the patient.  Interestingly, his platelet count is minimally lower than it was in the past at 109,000.  His WBC count is low as well at 3.5.  I printed him a copy of his lab work so he can have it to go home with.  I explained to him some etiologies of these abnormalities including an asymptomatic infection.  He is very worried about his blood test results but I tried to ease his mind.    So we will perform a CBC diff, in 3 months followed by 6 months and 12 months.  I suspect his white count will normalize in the future.   Otherwise, he denies any bleeding.  He denies any rash or petechiae. Complete hematologic ROS questioning is negative.  He is following up with a neurologist at [facility]  regarding his aneurysm.  He has seen Dr. Hart Rochester regarding his varicose veins and it was recommended that he wear compression stockings.  He has not gotten them to date.   Past Medical History  Diagnosis Date  . Thrombocytopenia   . ITP (idiopathic thrombocytopenic purpura)   . HTN (hypertension)   . DM (diabetes mellitus)   . A-fib   . Chronic anticoagulation   . Aneurysm   . Anxiety   . Chronic ITP (idiopathic thrombocytopenia) 04/10/2012    Low grade thrombocytopenia consistent with chronic ITP not in need of therapy.     has DIABETES MELLITUS; HYPERTENSION; ATRIAL FIBRILLATION; OSTEOARTHRITIS; OTHER DYSPHAGIA; COLONIC POLYPS, HYPERPLASTIC, HX OF; Varicose veins of lower extremities with other complications; and Chronic ITP (idiopathic thrombocytopenia) on his problem list.     is allergic to penicillins.  Mr. Conde had no medications administered during this visit.  Past Surgical History  Procedure Date  . Knee arthroscopy     right and left knee/1992 and 2000  . Vasectomy   . Circumcision     Denies any headaches, dizziness, double vision, fevers, chills, night sweats, nausea, vomiting, diarrhea, constipation, chest pain, heart palpitations, shortness of breath, blood in stool, black tarry stool, urinary pain, urinary burning, urinary frequency, hematuria.   PHYSICAL EXAMINATION  ECOG PERFORMANCE STATUS: 0 - Asymptomatic  Filed Vitals:   04/10/12 0900  BP: 197/69  Pulse: 56  Temp: 97.5 F (36.4 C)  Resp: 18    GENERAL:alert, healthy, no distress, well nourished, well developed, anxious, comfortable, cooperative and smiling SKIN: skin color, texture, turgor are normal, no rashes or significant lesions HEAD: Normocephalic, No masses, lesions, tenderness or abnormalities EYES: normal, Conjunctiva are pink and non-injected EARS: External ears normal OROPHARYNX:lips, buccal mucosa, and tongue normal and mucous membranes are moist  NECK: supple, no adenopathy, thyroid  normal size, non-tender, without nodularity, no stridor, non-tender LYMPH:  no palpable lymphadenopathy, no hepatosplenomegaly BREAST:not examined LUNGS: clear to auscultation and percussion HEART: regular rate & rhythm, no murmurs, no gallops, S1 normal and S2 normal ABDOMEN:abdomen soft, non-tender, obese, normal bowel sounds, no masses or organomegaly and no hepatosplenomegaly BACK: Back symmetric, no curvature. EXTREMITIES:less then 2 second capillary refill, no joint deformities, effusion, or inflammation, no edema, no skin discoloration, no clubbing, no cyanosis  NEURO: alert & oriented x 3 with fluent speech, no focal motor/sensory deficits, gait normal    LABORATORY DATA: CBC    Component Value Date/Time   WBC 3.5* 04/08/2012 0828   RBC 4.53 04/08/2012 0828   HGB 13.9 04/08/2012 0828   HCT 41.4 04/08/2012 0828   PLT 109* 04/08/2012 0828   MCV 91.4 04/08/2012 0828   MCH 30.7 04/08/2012 0828   MCHC 33.6 04/08/2012 0828   RDW 12.7 04/08/2012 0828   LYMPHSABS 2.2 04/10/2011 0830   MONOABS 0.5 04/10/2011 0830   EOSABS 0.3 04/10/2011 0830   BASOSABS 0.0 04/10/2011 0830    Results for Jason, Alvarez (MRN [id number]) as of 04/10/2012 09:13  Ref. Range 04/11/2010 11:45 04/10/2011 08:30 09/11/2011 16:54 10/23/2011 10:12 04/08/2012 08:28  Platelets Latest Range: 150-400 K/uL 139 (L) 122 (L)  124 (L) 109 (L)    Results for Jason, Alvarez (MRN [id number]) as of 04/10/2012 09:13  Ref. Range 04/11/2010 11:45 04/10/2011 08:30 09/11/2011 16:54 10/23/2011 10:12 04/08/2012 08:28  WBC Latest Range: 4.0-10.5 K/uL 5.3 5.1  4.6 3.5 (L)    Results for Jason, Alvarez (MRN [id number]) as of 04/10/2012 09:13  Ref. Range 10/11/2009 13:05 04/11/2010 11:45 04/10/2011 08:30 09/11/2011 16:54 10/23/2011 10:12 04/08/2012 08:28  Hemoglobin Latest Range: 13.0-17.0 g/dL 84.1 32.4 40.1  02.7 25.3     ASSESSMENT: 1. Low grade thrombocytopenia consistent with chronic ITP not in need of therapy.  2. Atrial fibrillation on Coumadin.    3. Hypertension.  4. Diabetes mellitus type 2.  5. Varicose veins on the right leg which are painful and quite  significantly at times.  6. Chronic anxiety on p.r.n. Xanax.  7. 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 Corliss Skains and is now seeing someone at [facility] Neurology Department after seeking a second opinion.Marland Kitchen   PLAN:  1. I personally reviewed and went over laboratory results with the patient.  Patient education provided regarding the patients 2. Lab work in 3, 6, 12 months: CBC with diff. 3. Return in 1 year for follow-up.    All questions were answered. The patient knows to call the clinic with any problems, questions or concerns. We can certainly see the patient much sooner if necessary.  The patient and plan discussed with Glenford Peers, MD and he is in agreement with the aforementioned.  Shabana Jason Alvarez

## 2012-04-10 NOTE — Patient Instructions (Addendum)
[facility]  Discharge Instructions  RECOMMENDATIONS MADE BY THE CONSULTANT AND ANY TEST RESULTS WILL BE SENT TO YOUR REFERRING DOCTOR.   EXAM FINDINGS BY MD TODAY AND SIGNS AND SYMPTOMS TO REPORT TO CLINIC OR PRIMARY MD: Your platelet count is a little lower and we will check your labs a little more frequently.  Report unusual bruising, bleeding or other problems.  MEDICATIONS PRESCRIBED: none      SPECIAL INSTRUCTIONS/FOLLOW-UP: Lab work Needed in 3 months, 6 months and 1 year  and Return to Clinic for follow-up.   I acknowledge that I have been informed and understand all the instructions given to me and received a copy. I do not have any more questions at this time, but understand that I may call the Specialty Clinic at [facility] at [phone] during business hours should I have any further questions or need assistance in obtaining follow-up care.    __________________________________________  _____________  __________ Signature of Patient or Authorized Representative            Date                   Time    __________________________________________ Nurse's Signature

## 2012-04-25 DIAGNOSIS — Q289 Congenital malformation of circulatory system, unspecified: Secondary | ICD-10-CM | POA: Insufficient documentation

## 2012-06-20 NOTE — H&P (Signed)
  NTS SOAP Note  Vital Signs:  Vitals as of: 06/20/2012: Systolic 180: Diastolic 81: Heart Rate 65: Temp 97.13F: Height 4ft 3in: Weight 229Lbs 0 Ounces: BMI 29  BMI : 28.62 kg/m2  Subjective: This 71 Years 53 Months old Male presents for of colon polyps.  Last had a TCS in 2009.  Here to schedule follow up TCS.  No new gi complaints.  Review of Symptoms:  Constitutional:unremarkable   Head:unremarkable    Eyes:unremarkable   Nose/Mouth/Throat:unremarkable     intermittent atrial fibrillation Respiratory:unremarkable   Gastrointestinal:  unremarkable   Genitourinary:unremarkable     Musculoskeletal:unremarkable   Skin:unremarkable Hematolgic/Lymphatic:unremarkable     Allergic/Immunologic:unremarkable     Past Medical History:    Reviewed   Past Medical History  Surgical History: TCS Medical Problems:  Diabetes, Heart problems, High Blood pressure Allergies: nkda Medications: HCTZ, ramipril, meformin, warfarin, metoprolol, glypuride   Social History:Reviewed  Social History  Preferred Language: English Ethnicity: Not Hispanic / Latino Age: 71 Years 9 Months Marital Status:  M Alcohol:  No Recreational drug(s):  No   Smoking Status: Never smoker reviewed on 06/20/2012 Functional Status reviewed on mm/dd/yyyy ------------------------------------------------ Bathing: Normal Cooking: Normal Dressing: Normal Driving: Normal Eating: Normal Managing Meds: Normal Oral Care: Normal Shopping: Normal Toileting: Normal Transferring: Normal Walking: Normal Cognitive Status reviewed on mm/dd/yyyy ------------------------------------------------ Attention: Normal Decision Making: Normal Language: Normal Memory: Normal Motor: Normal Perception: Normal Problem Solving: Normal Visual and Spatial: Normal   Family History:  Reviewed   Family History  Is there a family history of:No family h/o colon  cancer    Objective Information: General:  Well appearing, well nourished in no distress. Throat:   Neck:  Supple without lymphadenopathy.  Heart:  RRR, no murmur Lungs:    CTA bilaterally, no wheezes, rhonchi, rales.  Breathing unlabored. Abdomen:Soft, NT/ND, no HSM, no masses.   deferred to procedure  Assessment:h/o colon polyps  Diagnosis &amp; Procedure: DiagnosisCode: V12.72, ProcedureCode: 16109,    Plan:  Scheduled for TCS on 06/25/12.  To stop coumadin four days prior.   Patient Education:Alternative treatments to surgery were discussed with patient (and family).  Risks and benefits  of procedure were fully explained to the patient (and family) who gave informed consent. Patient/family questions were addressed.  Follow-up:Pending Surgery

## 2012-06-21 ENCOUNTER — Encounter (HOSPITAL_COMMUNITY): Payer: Self-pay | Admitting: Pharmacy Technician

## 2012-06-24 NOTE — Progress Notes (Signed)
Spoke to Jason Alvarez concerning his question about nighttime medications. Informed him to speak to  Dr. Lovell Sheehan' office to find out which medications Dr. Lovell Sheehan wants him to take. Gave him Dr. Lovell Sheehan' office number. Verbalized understanding.

## 2012-06-25 ENCOUNTER — Encounter (HOSPITAL_COMMUNITY)
Admission: RE | Disposition: A | Discharge status: Home / Self Care | Payer: Self-pay | Source: Ambulatory Visit | Attending: General Surgery

## 2012-06-25 ENCOUNTER — Ambulatory Visit (HOSPITAL_COMMUNITY)
Admission: RE | Admit: 2012-06-25 | Discharge: 2012-06-25 | Disposition: A | Discharge status: Home / Self Care | Payer: Medicare Other | Source: Ambulatory Visit | Attending: General Surgery | Admitting: General Surgery

## 2012-06-25 ENCOUNTER — Encounter (HOSPITAL_COMMUNITY): Payer: Self-pay | Admitting: *Deleted

## 2012-06-25 DIAGNOSIS — E119 Type 2 diabetes mellitus without complications: Secondary | ICD-10-CM | POA: Insufficient documentation

## 2012-06-25 DIAGNOSIS — D128 Benign neoplasm of rectum: Secondary | ICD-10-CM | POA: Insufficient documentation

## 2012-06-25 DIAGNOSIS — D129 Benign neoplasm of anus and anal canal: Secondary | ICD-10-CM | POA: Insufficient documentation

## 2012-06-25 DIAGNOSIS — D126 Benign neoplasm of colon, unspecified: Secondary | ICD-10-CM | POA: Insufficient documentation

## 2012-06-25 HISTORY — PX: COLONOSCOPY: SHX5424

## 2012-06-25 SURGERY — COLONOSCOPY
Anesthesia: Moderate Sedation

## 2012-06-25 MED ORDER — MEPERIDINE HCL 100 MG/ML IJ SOLN
INTRAMUSCULAR | Status: AC
  Filled 2012-06-25: qty 1

## 2012-06-25 MED ORDER — SODIUM CHLORIDE 0.45 % IV SOLN
INTRAVENOUS | Status: DC
  Administered 2012-06-25: 09:00:00 via INTRAVENOUS

## 2012-06-25 MED ORDER — MIDAZOLAM HCL 5 MG/5ML IJ SOLN
INTRAMUSCULAR | Status: DC | PRN
  Administered 2012-06-25: 1 mg via INTRAVENOUS
  Administered 2012-06-25: 3 mg via INTRAVENOUS

## 2012-06-25 MED ORDER — MIDAZOLAM HCL 5 MG/5ML IJ SOLN
INTRAMUSCULAR | Status: AC
  Filled 2012-06-25: qty 10

## 2012-06-25 MED ORDER — STERILE WATER FOR IRRIGATION IR SOLN
Status: DC | PRN
  Administered 2012-06-25: 09:00:00

## 2012-06-25 MED ORDER — MEPERIDINE HCL 25 MG/ML IJ SOLN
INTRAMUSCULAR | Status: DC | PRN
  Administered 2012-06-25: 50 mg via INTRAVENOUS

## 2012-06-25 NOTE — Op Note (Signed)
[facility] [address] [place], [address]   COLONOSCOPY PROCEDURE REPORT  PATIENT: Jason, Alvarez  MR#: [id number] BIRTHDATE: Jul 12, 1941 , 71  yrs. old GENDER: Male ENDOSCOPIST: Franky Macho, MD REFERRED LO:VFIEP, Peyton Najjar PROCEDURE DATE:  06/25/2012 PROCEDURE:   Colonoscopy with snare polypectomy ASA CLASS:   Class II INDICATIONS:Patient's personal history of colon polyps. MEDICATIONS: Versed 4 mg IV and Demerol 50 mg IV  DESCRIPTION OF PROCEDURE:   After the risks benefits and alternatives of the procedure were thoroughly explained, informed consent was obtained.  A digital rectal exam revealed no abnormalities of the rectum.   The Pentax Colonoscope [id number] endoscope was introduced through the anus and advanced to the cecum, which was identified by both the appendix and ileocecal valve. No adverse events experienced.   The quality of the prep was adequate, using MoviPrep  The instrument was then slowly withdrawn as the colon was fully examined.      COLON FINDINGS: Two semi-pedunculated polyps ranging between 3-58mm in size were found in the sigmoid colon.  A polypectomy was performed using snare cautery (image 3 and 4).  The resection was complete and the polyp tissue was completely retrieved. Destruction of the more proximal polypoid tissue via ablation was performed (Image 1 -2).  No tissue retrieved.  There was no blood loss from maneuver.   A sessile polyp ranging between 3-21mm in size was found in the rectum (Image 5 - 6).  A polypectomy was performed using snare cautery.  The resection was complete and the polyp tissue was completely retrieved.  Retroflexed views revealed no abnormalities. The time to cecum=2 minutes 0 seconds.  Withdrawal time=16 minutes 0 seconds.  The scope was withdrawn and the procedure completed. COMPLICATIONS: There were no complications.  ENDOSCOPIC IMPRESSION: 1.   Two semi-pedunculated polyps ranging between 3-78mm in  size were found in the sigmoid colon; polypectomy was performed using snare cautery; Destruction of tissue via ablation was attempted; Destruction of tissue via ablation was attempted 2.   Sessile polyp ranging between 3-61mm in size was found in the rectum; polypectomy was performed using snare cautery  RECOMMENDATIONS: 1.  Await pathology results 2.  Repeat Colonoscopy in 3 years.   eSigned:  Franky Macho, MD 06/25/2012 9:42 AM   cc:   PATIENT NAME:  Jason, Alvarez MR#: [id number]

## 2012-06-25 NOTE — Interval H&P Note (Signed)
History and Physical Interval Note:  06/25/2012 9:00 AM  Jason Alvarez  has presented today for surgery, with the diagnosis of History of colon polyps  The various methods of treatment have been discussed with the patient and family. After consideration of risks, benefits and other options for treatment, the patient has consented to  Procedure(s) (LRB) with comments: COLONOSCOPY (N/A) as a surgical intervention .  The patient's history has been reviewed, patient examined, no change in status, stable for surgery.  I have reviewed the patient's chart and labs.  Questions were answered to the patient's satisfaction.     Franky Macho A

## 2012-06-27 ENCOUNTER — Encounter (HOSPITAL_COMMUNITY): Payer: Self-pay | Admitting: General Surgery

## 2012-07-15 ENCOUNTER — Encounter (HOSPITAL_COMMUNITY): Payer: 59 | Attending: Oncology

## 2012-07-15 DIAGNOSIS — D693 Immune thrombocytopenic purpura: Secondary | ICD-10-CM

## 2012-07-15 LAB — CBC WITH DIFFERENTIAL/PLATELET
Basophils Absolute: 0 10*3/uL (ref 0.0–0.1)
HCT: 40.9 % (ref 39.0–52.0)
Hemoglobin: 14 g/dL (ref 13.0–17.0)
Lymphocytes Relative: 26 % (ref 12–46)
MCV: 91.1 fL (ref 78.0–100.0)
Neutro Abs: 2.5 10*3/uL (ref 1.7–7.7)
Neutrophils Relative %: 58 % (ref 43–77)
Platelets: 116 10*3/uL — ABNORMAL LOW (ref 150–400)
RDW: 12.6 % (ref 11.5–15.5)

## 2012-07-15 NOTE — Progress Notes (Signed)
Labs drawn today for cbc/diff 

## 2012-10-07 ENCOUNTER — Ambulatory Visit: Payer: Self-pay | Admitting: Cardiovascular Disease

## 2012-10-07 DIAGNOSIS — I4891 Unspecified atrial fibrillation: Secondary | ICD-10-CM

## 2012-10-07 DIAGNOSIS — Z7901 Long term (current) use of anticoagulants: Secondary | ICD-10-CM

## 2012-10-08 ENCOUNTER — Encounter (HOSPITAL_COMMUNITY): Payer: 59 | Attending: Oncology

## 2012-10-08 DIAGNOSIS — D693 Immune thrombocytopenic purpura: Secondary | ICD-10-CM | POA: Insufficient documentation

## 2012-10-08 LAB — CBC WITH DIFFERENTIAL/PLATELET
Basophils Absolute: 0 10*3/uL (ref 0.0–0.1)
HCT: 41.4 % (ref 39.0–52.0)
Hemoglobin: 14.3 g/dL (ref 13.0–17.0)
Lymphocytes Relative: 33 % (ref 12–46)
Lymphs Abs: 1.8 10*3/uL (ref 0.7–4.0)
Monocytes Absolute: 0.5 10*3/uL (ref 0.1–1.0)
Neutro Abs: 2.8 10*3/uL (ref 1.7–7.7)
RBC: 4.59 MIL/uL (ref 4.22–5.81)
RDW: 12.5 % (ref 11.5–15.5)
WBC: 5.6 10*3/uL (ref 4.0–10.5)

## 2012-10-08 NOTE — Progress Notes (Signed)
Labs drawn today for cbc/diff 

## 2012-11-26 ENCOUNTER — Other Ambulatory Visit: Payer: Self-pay | Admitting: *Deleted

## 2012-11-26 MED ORDER — WARFARIN SODIUM 5 MG PO TABS: 5.0000 mg | ORAL_TABLET | Freq: Every day | ORAL | Status: DC

## 2012-11-28 ENCOUNTER — Encounter: Payer: Self-pay | Admitting: *Deleted

## 2012-11-28 ENCOUNTER — Encounter: Payer: Self-pay | Admitting: Cardiovascular Disease

## 2012-11-28 ENCOUNTER — Telehealth: Payer: Self-pay | Admitting: Cardiovascular Disease

## 2012-11-28 NOTE — Telephone Encounter (Signed)
Just wanted you to know that pt goes to [facility] office-insisted that he always call for help with atrial fib at this office!

## 2012-11-28 NOTE — Telephone Encounter (Signed)
Spoke with patient informing him that the will need appointment for evaluation of his symptoms. His last appointment with Dr. Allyson Sabal was last October. Patient states that his symptoms are now better since calling. He declined appointment offer today to see Judie Grieve saying that his wife is having a "panic attack" therefore he cannot come. Informed patient that if his symptoms continue he can call back for appointment.

## 2012-11-28 NOTE — Telephone Encounter (Signed)
Pt is in atrial fib!

## 2012-12-04 ENCOUNTER — Other Ambulatory Visit: Payer: Self-pay | Admitting: Cardiovascular Disease

## 2012-12-10 ENCOUNTER — Ambulatory Visit (INDEPENDENT_AMBULATORY_CARE_PROVIDER_SITE_OTHER): Payer: Self-pay | Admitting: Pharmacist Clinician (PhC)/ Clinical Pharmacy Specialist

## 2012-12-10 DIAGNOSIS — Z7901 Long term (current) use of anticoagulants: Secondary | ICD-10-CM

## 2012-12-10 DIAGNOSIS — I4891 Unspecified atrial fibrillation: Secondary | ICD-10-CM

## 2013-01-09 ENCOUNTER — Other Ambulatory Visit: Payer: Self-pay | Admitting: Cardiovascular Disease

## 2013-01-09 LAB — PROTIME-INR
INR: 2.46 — ABNORMAL HIGH (ref <1.50)
Prothrombin Time: 25.7 seconds — ABNORMAL HIGH (ref 11.6–15.2)

## 2013-01-10 ENCOUNTER — Ambulatory Visit (INDEPENDENT_AMBULATORY_CARE_PROVIDER_SITE_OTHER): Payer: Self-pay | Admitting: Pharmacist Clinician (PhC)/ Clinical Pharmacy Specialist

## 2013-01-10 DIAGNOSIS — Z7901 Long term (current) use of anticoagulants: Secondary | ICD-10-CM

## 2013-01-10 DIAGNOSIS — I4891 Unspecified atrial fibrillation: Secondary | ICD-10-CM

## 2013-01-24 ENCOUNTER — Telehealth: Payer: Self-pay | Admitting: Pharmacist Clinician (PhC)/ Clinical Pharmacy Specialist

## 2013-01-24 NOTE — Telephone Encounter (Signed)
Pt is calling regarding some medications. Please call [phone] then [phone].

## 2013-02-03 ENCOUNTER — Other Ambulatory Visit: Payer: Self-pay | Admitting: Cardiovascular Disease

## 2013-02-04 ENCOUNTER — Ambulatory Visit (INDEPENDENT_AMBULATORY_CARE_PROVIDER_SITE_OTHER): Payer: Self-pay | Admitting: Pharmacist Clinician (PhC)/ Clinical Pharmacy Specialist

## 2013-02-04 DIAGNOSIS — Z7901 Long term (current) use of anticoagulants: Secondary | ICD-10-CM

## 2013-02-04 DIAGNOSIS — I4891 Unspecified atrial fibrillation: Secondary | ICD-10-CM

## 2013-02-04 NOTE — Telephone Encounter (Signed)
Pt wanted to know if zyrtec was ok.  Was switching to some new DM meds.  Will get names when call for next INR.  Assured him zyrtec was fine.

## 2013-02-07 ENCOUNTER — Telehealth: Payer: Self-pay | Admitting: Cardiology

## 2013-02-07 NOTE — Telephone Encounter (Signed)
Pt called to say he was in Atrial fib.has been in since noon today.  He is on coumadin.  I asked him to take 25 mg of toprol now, then his usual dose of 50 mg at 1900.  He will hold the altace until 2200 and take then if his BP is greater than 110/70.  He will call if he stays in atrial fib.

## 2013-02-24 ENCOUNTER — Other Ambulatory Visit: Payer: Self-pay | Admitting: Cardiovascular Disease

## 2013-02-24 LAB — PROTIME-INR: Prothrombin Time: 19.3 seconds — ABNORMAL HIGH (ref 11.6–15.2)

## 2013-02-25 ENCOUNTER — Telehealth: Payer: Self-pay | Admitting: Cardiovascular Disease

## 2013-02-25 NOTE — Telephone Encounter (Signed)
Returned call.  No answer/voicemail.  Will try later.  

## 2013-02-25 NOTE — Telephone Encounter (Signed)
Patient states that he is having problems his A FIb

## 2013-02-25 NOTE — Telephone Encounter (Signed)
Mr. Jason Alvarez stated that he did not received a call. Right now he said his AFIB is doing pretty good right now and if he stats feeling bad again he will call back.. Thanks

## 2013-02-25 NOTE — Telephone Encounter (Signed)
Returned call.  Pt stated he had Afib all day yesterday and it got worse yesterday evening.  Pt gave the following BP/HR:  124/101  105 140/96  104 144/96  80 126/81  87 (today)  Stated HR is "sixty-something" now.  Pt stated he has been having Afib more frequently than he used to.  Asked pt if he is taking metoprolol and pt stated he is taking all of his meds the way he is supposed to except his diabetes med.  Pt was advised to take as directed.  Offered appt w/ Extender tomorrow and pt declined r/t death in the family.  Stated he has a lot going on right now.  Pt scheduled appt w/ Dr. Allyson Sabal 8.27.14 at 3:45pm as he prefers to see Dr Allyson Sabal.  There was an opening tomorrow at 9am and pt declined.  Stated he feels better now.  Pt agreed to call back if symptoms return and he wants to be seen sooner by PA/NP.

## 2013-02-26 ENCOUNTER — Ambulatory Visit (INDEPENDENT_AMBULATORY_CARE_PROVIDER_SITE_OTHER): Payer: Self-pay | Admitting: Pharmacist Clinician (PhC)/ Clinical Pharmacy Specialist

## 2013-02-26 DIAGNOSIS — Z7901 Long term (current) use of anticoagulants: Secondary | ICD-10-CM

## 2013-02-26 DIAGNOSIS — I4891 Unspecified atrial fibrillation: Secondary | ICD-10-CM

## 2013-03-10 ENCOUNTER — Ambulatory Visit (INDEPENDENT_AMBULATORY_CARE_PROVIDER_SITE_OTHER): Payer: Self-pay | Admitting: Pharmacist Clinician (PhC)/ Clinical Pharmacy Specialist

## 2013-03-10 ENCOUNTER — Other Ambulatory Visit: Payer: Self-pay | Admitting: Cardiovascular Disease

## 2013-03-10 DIAGNOSIS — Z7901 Long term (current) use of anticoagulants: Secondary | ICD-10-CM

## 2013-03-10 DIAGNOSIS — I4891 Unspecified atrial fibrillation: Secondary | ICD-10-CM

## 2013-03-12 ENCOUNTER — Ambulatory Visit (INDEPENDENT_AMBULATORY_CARE_PROVIDER_SITE_OTHER): Payer: PRIVATE HEALTH INSURANCE | Admitting: Cardiovascular Disease

## 2013-03-12 ENCOUNTER — Encounter: Payer: Self-pay | Admitting: Cardiovascular Disease

## 2013-03-12 VITALS — BP 162/58 | HR 65 | Ht 75.0 in | Wt 222.0 lb

## 2013-03-12 DIAGNOSIS — I4891 Unspecified atrial fibrillation: Secondary | ICD-10-CM

## 2013-03-12 DIAGNOSIS — I10 Essential (primary) hypertension: Secondary | ICD-10-CM

## 2013-03-12 NOTE — Progress Notes (Signed)
03/12/2013 Jason Alvarez   01-Jun-1941  [id number]  Primary Physician Cassell Smiles., MD Primary Cardiologist: Runell Gess MD Roseanne Reno   HPI:  The patient is a very pleasant 72 year old mildly to moderately overweight married Caucasian male father of 2 who I last saw in April of 2012. He was initially sent to me for claudication. His risk factors include hypertension, non-insulin-requiring diabetes, and paroxysmal atrial fibrillation with nonsustained ventricular tachycardia on MCOT. He has mild sleep apnea without requiring CPAP. His LV function is normal by echo, and a Myoview was negative back in 2009. He is on Coumadin anticoagulation. He is otherwise asymptomatic. His last lipid profile performed in January revealed a total cholesterol of 170, LDL of 103, and HDL or 40.since I last saw him in October last year he relates increased frequency and duration of his A. Fib episodes. He denies chest pain or shortness of breath.     Current Outpatient Prescriptions  Medication Sig Dispense Refill  . ALPRAZolam (XANAX) 1 MG tablet Take 1 mg by mouth 3 (three) times daily as needed. For anxiety      . glipiZIDE-metformin (METAGLIP) 5-500 MG per tablet Take 1 tablet by mouth daily.       . hydrochlorothiazide (HYDRODIURIL) 25 MG tablet Take 25 mg by mouth daily.        Marland Kitchen HYDROcodone-acetaminophen (VICODIN) 5-500 MG per tablet Take 1 tablet by mouth every 8 (eight) hours as needed. For pain      . metoprolol succinate (TOPROL-XL) 50 MG 24 hr tablet Take 50 mg by mouth every evening.      . ramipril (ALTACE) 10 MG capsule Take 10 mg by mouth 2 (two) times daily.      Marland Kitchen warfarin (COUMADIN) 5 MG tablet Take 1 tablet (5 mg total) by mouth daily. 5mg  on Mon.,Wed.,Fri.,and 7.5mg  on Tues.,.,Thur.,Sat.,Sun.,  60 tablet  0   No current facility-administered medications for this visit.    Allergies  Allergen Reactions  . Penicillins Other (See Comments)    Childhood allergy     History   Social History  . Marital Status: Married    Spouse Name: N/A    Number of Children: N/A  . Years of Education: N/A   Occupational History  . Not on file.   Social History Main Topics  . Smoking status: Never Smoker   . Smokeless tobacco: Never Used  . Alcohol Use: No  . Drug Use: No  . Sexual Activity:    Other Topics Concern  . Not on file   Social History Narrative  . No narrative on file     Review of Systems: General: negative for chills, fever, night sweats or weight changes.  Cardiovascular: negative for chest pain, dyspnea on exertion, edema, orthopnea, palpitations, paroxysmal nocturnal dyspnea or shortness of breath Dermatological: negative for rash Respiratory: negative for cough or wheezing Urologic: negative for hematuria Abdominal: negative for nausea, vomiting, diarrhea, bright red blood per rectum, melena, or hematemesis Neurologic: negative for visual changes, syncope, or dizziness All other systems reviewed and are otherwise negative except as noted above.    Blood pressure 162/58, pulse 65, height 6\' 3"  (1.905 m), weight 222 lb (100.699 kg).  General appearance: alert and no distress Neck: no adenopathy, no carotid bruit, no JVD, supple, symmetrical, trachea midline and thyroid not enlarged, symmetric, no tenderness/mass/nodules Lungs: clear to auscultation bilaterally Heart: regular rate and rhythm, S1, S2 normal, no murmur, click, rub or gallop Extremities: extremities normal, atraumatic, no  cyanosis or edema  EKG normal sinus rhythm at 62 without ST or T wave changes  ASSESSMENT AND PLAN:   ATRIAL FIBRILLATION Patient has a history of paroxysmal fibrillation on Coumadin anticoagulation. His episodes of more frequent and pronounced. His last echo performed 7/17/9 revealed number of the size and function with mild left atrial dilatation. I'm going to refer him to Dr. Hillis Range for consideration of A. Fib  ablation.  HYPERTENSION Well-controlled on current medications      Runell Gess MD [facility] 03/12/2013 4:30 PM

## 2013-03-12 NOTE — Patient Instructions (Signed)
  We will see you back in follow up in 1 year with Dr Allyson Sabal  Dr Allyson Sabal has ordered an echocardiogram  Dr Allyson Sabal has referred you to Dr Johney Frame for an Afib ablation

## 2013-03-12 NOTE — Assessment & Plan Note (Signed)
Well-controlled on current medications 

## 2013-03-12 NOTE — Assessment & Plan Note (Signed)
Patient has a history of paroxysmal fibrillation on Coumadin anticoagulation. His episodes of more frequent and pronounced. His last echo performed 7/17/9 revealed number of the size and function with mild left atrial dilatation. I'm going to refer him to Dr. Hillis Range for consideration of A. Fib ablation.

## 2013-03-14 ENCOUNTER — Telehealth (HOSPITAL_COMMUNITY): Payer: Self-pay | Admitting: Cardiovascular Disease

## 2013-03-20 ENCOUNTER — Ambulatory Visit (HOSPITAL_COMMUNITY)
Admission: RE | Admit: 2013-03-20 | Discharge: 2013-03-20 | Disposition: A | Discharge status: Home / Self Care | Payer: PRIVATE HEALTH INSURANCE | Source: Ambulatory Visit | Attending: Cardiovascular Disease | Admitting: Cardiovascular Disease

## 2013-03-20 ENCOUNTER — Encounter: Payer: Self-pay | Admitting: *Deleted

## 2013-03-20 DIAGNOSIS — I10 Essential (primary) hypertension: Secondary | ICD-10-CM | POA: Insufficient documentation

## 2013-03-20 DIAGNOSIS — E119 Type 2 diabetes mellitus without complications: Secondary | ICD-10-CM | POA: Insufficient documentation

## 2013-03-20 DIAGNOSIS — I4891 Unspecified atrial fibrillation: Secondary | ICD-10-CM | POA: Insufficient documentation

## 2013-03-20 DIAGNOSIS — G4730 Sleep apnea, unspecified: Secondary | ICD-10-CM | POA: Insufficient documentation

## 2013-03-20 NOTE — Progress Notes (Signed)
2D Echo Performed 03/20/2013    Farhad Burleson, RCS  

## 2013-03-31 ENCOUNTER — Encounter: Payer: Self-pay | Admitting: *Deleted

## 2013-03-31 ENCOUNTER — Encounter (INDEPENDENT_AMBULATORY_CARE_PROVIDER_SITE_OTHER): Payer: PRIVATE HEALTH INSURANCE

## 2013-03-31 ENCOUNTER — Ambulatory Visit (INDEPENDENT_AMBULATORY_CARE_PROVIDER_SITE_OTHER): Payer: PRIVATE HEALTH INSURANCE | Admitting: Internal Medicine

## 2013-03-31 ENCOUNTER — Encounter: Payer: Self-pay | Admitting: Internal Medicine

## 2013-03-31 VITALS — BP 177/80 | HR 68 | Ht 75.0 in | Wt 224.0 lb

## 2013-03-31 DIAGNOSIS — I498 Other specified cardiac arrhythmias: Secondary | ICD-10-CM

## 2013-03-31 DIAGNOSIS — I10 Essential (primary) hypertension: Secondary | ICD-10-CM

## 2013-03-31 DIAGNOSIS — I471 Supraventricular tachycardia: Secondary | ICD-10-CM

## 2013-03-31 DIAGNOSIS — I4891 Unspecified atrial fibrillation: Secondary | ICD-10-CM

## 2013-03-31 LAB — T4, FREE: Free T4: 0.78 ng/dL (ref 0.60–1.60)

## 2013-03-31 LAB — TSH: TSH: 0.8 u[IU]/mL (ref 0.35–5.50)

## 2013-03-31 MED ORDER — AMLODIPINE BESYLATE 5 MG PO TABS
5.0000 mg | ORAL_TABLET | Freq: Every day | ORAL | Status: DC
Start: 2013-03-31 — End: 2013-05-15

## 2013-03-31 NOTE — Patient Instructions (Addendum)
Your physician recommends that you schedule a follow-up appointment in: 6 weeks with Dr Johney Frame  Your physician has recommended you make the following change in your medication:  1) Start Amlodipine 5mg  daily  Your physician has recommended that you wear an event monitor. Event monitors are medical devices that record the heart's electrical activity. Doctors most often [place] these monitors to diagnose arrhythmias. Arrhythmias are problems with the speed or rhythm of the heartbeat. The monitor is a small, portable device. You can wear one while you do your normal daily activities. This is usually used to diagnose what is causing palpitations/syncope (passing out).  Your physician recommends that you return for lab work today: TSH/T4

## 2013-03-31 NOTE — Progress Notes (Signed)
Primary Care Physician: Cassell Smiles., MD Referring Physician: Nanetta Batty, MD   Jason Alvarez is a 72 y.o. male with a h/o hypertension, diabetes, thrombocytopenia, and paroxysmal atrial fibrillation.  He was first diagnosed with atrial fibrillation in 2009.  At that time, he was awakened from sleep with tachypalpitations.  He went to [facility] where he converted to sinus rhythm spontaneously.  Since then, he has had increasing episodes of symptomatic atrial fibrillation.  His episodes are occuring about once a week and usually last about an hour.  They are associated with tachypalpitations and chest discomfort.   He has been evaluated with myoview in 2009 which was negative.  Last echo in September of this year demonstrated an EF of 55-60%, mild concentric hypertrophy, LA 41.  Event monitor from 2009 reports atrial fibrillation, but strips are not available.  He can not remember if he has had a recent thyroid evaluation.   He has been maintained on Warfarin for anticoagulation and Toprol for control of atrial fibrillation symptoms.  He reports increasing frequency and duration of his palpitations over the past year.  Presently, they occur most days, lasting up to several hours at a time.   His BP has been elevated.  Today, he denies symptoms of chest pain, shortness of breath,  dizziness, presyncope, syncope, or neurologic sequela. The patient is tolerating medications without difficulties and is otherwise without complaint today.   Past Medical History  Diagnosis Date  . Thrombocytopenia   . ITP (idiopathic thrombocytopenic purpura)   . HTN (hypertension)   . DM (diabetes mellitus)   . A-fib     paf with NSVT on MCOT, on coumadin, echo 01/31/08-nl lv, mild aortic valve sclerosis   . Chronic anticoagulation   . Aneurysm   . Anxiety   . Chronic ITP (idiopathic thrombocytopenia) 04/10/2012    Low grade thrombocytopenia consistent with chronic ITP not in need of therapy.    . Carotid bruit     doppler 05/17/12-mild plaque with no diameter reduction  . H/O cardiovascular stress test 01/31/08    no ishcemia, ef 61%  . Hypertension    Past Surgical History  Procedure Laterality Date  . Knee arthroscopy      right and left knee/1992 and 2000  . Vasectomy    . Circumcision    . Colonoscopy  06/25/2012    Procedure: COLONOSCOPY;  Surgeon: Dalia Heading, MD;  [facility]: [facility];  Service: Gastroenterology;  Laterality: N/A;    Current Outpatient Prescriptions  Medication Sig Dispense Refill  . ALPRAZolam (XANAX) 1 MG tablet Take 1 mg by mouth 3 (three) times daily as needed. For anxiety      . glipiZIDE-metformin (METAGLIP) 5-500 MG per tablet Take 1 tablet by mouth daily.       . hydrochlorothiazide (HYDRODIURIL) 25 MG tablet Take 25 mg by mouth daily.        Marland Kitchen HYDROcodone-acetaminophen (VICODIN) 5-500 MG per tablet Take 1 tablet by mouth every 8 (eight) hours as needed. For pain      . metoprolol succinate (TOPROL-XL) 50 MG 24 hr tablet Take 50 mg by mouth every evening.      . ramipril (ALTACE) 10 MG capsule Take 10 mg by mouth 2 (two) times daily.      Marland Kitchen warfarin (COUMADIN) 5 MG tablet Take 1 tablet (5 mg total) by mouth daily. 5mg  on Mon.,Wed.,Fri.,and 7.5mg  on Tues.,.,Thur.,Sat.,Sun.,  60 tablet  0   No current facility-administered medications for  this visit.    Allergies  Allergen Reactions  . Penicillins Other (See Comments)    Childhood allergy    History   Social History  . Marital Status: Married    Spouse Name: N/A    Number of Children: N/A  . Years of Education: N/A   Occupational History  . Not on file.   Social History Main Topics  . Smoking status: Never Smoker   . Smokeless tobacco: Never Used  . Alcohol Use: No  . Drug Use: No  . Sexual Activity:    Other Topics Concern  . Not on file   Social History Narrative  . No narrative on file  Lives in [place] Retired from the [employer] department  Family History   Problem Relation Age of Onset  . Prostate cancer Father   . Cancer Sister     breast  . Cancer Brother     lung  . Melanoma Mother     ROS- All systems are reviewed and negative except as per the HPI above  Physical Exam: Filed Vitals:   03/31/13 1357  BP: 177/80  Pulse: 68  Height: 6\' 3"  (1.905 m)  Weight: 224 lb (101.606 kg)    GEN- The patient is well appearing, alert and oriented x 3 today.   Head- normocephalic, atraumatic Eyes-  Sclera clear, conjunctiva pink Ears- hearing intact Oropharynx- clear Neck- supple, no JVP Lymph- no cervical lymphadenopathy Lungs- Clear to ausculation bilaterally, normal work of breathing Heart- Regular rate and rhythm, no murmurs, rubs or gallops, PMI not laterally displaced GI- soft, NT, ND, + BS Extremities- no clubbing, cyanosis, trace R leg edema MS- no significant deformity or atrophy Skin- no rash or lesion Psych- euthymic mood, full affect Neuro- strength and sensation are intact  EKG- sinus rhythm with normal intervals  Echo 9/14 is reviewed Dr Renelda Mom notes are reviewed  Assessment and Plan:  1. long RP tachycardia (likely atrial tachycardia) The patient has symptomatic atrial tachycardia.  I have reviewed his prior event monitor which reveals atach but not afib.  I think that we should place an event monitor at this time to better determine if he has afib or just atrial tachycardia. Therapeutic strategies for his atrial arrhythmias including medicine and ablation were discussed in detail with the patient today. At this time, he would like to avoid ablation.  I will therefore initiate flecainide 100mg  BID.  He will return in 4 weeks for follow-up.  If his symptoms are controlled at that time, then we will proceed with GXT to evaluate for exercise induced arrhythmias. Obtain TFTs to assess for thyroid dysfunction as the cause for his arrhythmia  2. HTN Above goal Add norvasc 5mg  daily today I am not convinced that we can  adequately control his arrhythmias if BP remains significantly elevated  Return in 6 weeks

## 2013-03-31 NOTE — Progress Notes (Signed)
Patient ID: Jason Alvarez, male   DOB: [birth year], 72 y.o.   MRN: [id number] E-Cardio verite 30 day cardiac event monitor applied to patient.

## 2013-04-01 ENCOUNTER — Telehealth: Payer: Self-pay | Admitting: Internal Medicine

## 2013-04-01 NOTE — Telephone Encounter (Signed)
Patient has a question about his prescriptions.  He is not sure what he is supposed to take.  He was seen yesterday by Dr. Johney Frame.

## 2013-04-01 NOTE — Telephone Encounter (Signed)
Patient aware to take Amlodipine in addition to other medications

## 2013-04-02 DIAGNOSIS — I471 Supraventricular tachycardia: Secondary | ICD-10-CM | POA: Insufficient documentation

## 2013-04-10 ENCOUNTER — Other Ambulatory Visit (HOSPITAL_COMMUNITY): Payer: 59

## 2013-04-10 ENCOUNTER — Other Ambulatory Visit: Payer: Self-pay | Admitting: Cardiovascular Disease

## 2013-04-10 LAB — PROTIME-INR
INR: 2.51 — ABNORMAL HIGH (ref <1.50)
Prothrombin Time: 26.1 seconds — ABNORMAL HIGH (ref 11.6–15.2)

## 2013-04-11 ENCOUNTER — Ambulatory Visit (INDEPENDENT_AMBULATORY_CARE_PROVIDER_SITE_OTHER): Payer: PRIVATE HEALTH INSURANCE | Admitting: Pharmacist Clinician (PhC)/ Clinical Pharmacy Specialist

## 2013-04-11 ENCOUNTER — Encounter (HOSPITAL_COMMUNITY): Payer: Self-pay

## 2013-04-11 ENCOUNTER — Ambulatory Visit (HOSPITAL_COMMUNITY): Payer: 59

## 2013-04-11 DIAGNOSIS — Z7901 Long term (current) use of anticoagulants: Secondary | ICD-10-CM

## 2013-04-18 ENCOUNTER — Telehealth: Payer: Self-pay | Admitting: Pharmacist Clinician (PhC)/ Clinical Pharmacy Specialist

## 2013-04-18 DIAGNOSIS — Z7901 Long term (current) use of anticoagulants: Secondary | ICD-10-CM

## 2013-04-18 DIAGNOSIS — I4891 Unspecified atrial fibrillation: Secondary | ICD-10-CM

## 2013-04-18 NOTE — Telephone Encounter (Signed)
Standing order for INR at [facility]

## 2013-04-21 NOTE — Progress Notes (Signed)
This encounter was created in error - please disregard.

## 2013-05-12 ENCOUNTER — Other Ambulatory Visit: Payer: Self-pay | Admitting: Cardiovascular Disease

## 2013-05-12 LAB — PROTIME-INR
INR: 1.94 — ABNORMAL HIGH (ref <1.50)
Prothrombin Time: 21.6 seconds — ABNORMAL HIGH (ref 11.6–15.2)

## 2013-05-13 ENCOUNTER — Ambulatory Visit (INDEPENDENT_AMBULATORY_CARE_PROVIDER_SITE_OTHER): Payer: PRIVATE HEALTH INSURANCE | Admitting: Pharmacist Clinician (PhC)/ Clinical Pharmacy Specialist

## 2013-05-13 DIAGNOSIS — Z7901 Long term (current) use of anticoagulants: Secondary | ICD-10-CM

## 2013-05-15 ENCOUNTER — Encounter: Payer: Self-pay | Admitting: Internal Medicine

## 2013-05-15 ENCOUNTER — Ambulatory Visit (INDEPENDENT_AMBULATORY_CARE_PROVIDER_SITE_OTHER): Payer: PRIVATE HEALTH INSURANCE | Admitting: Internal Medicine

## 2013-05-15 VITALS — BP 150/80 | HR 56 | Ht 75.0 in | Wt 228.8 lb

## 2013-05-15 DIAGNOSIS — I471 Supraventricular tachycardia: Secondary | ICD-10-CM

## 2013-05-15 DIAGNOSIS — I4891 Unspecified atrial fibrillation: Secondary | ICD-10-CM

## 2013-05-15 DIAGNOSIS — I10 Essential (primary) hypertension: Secondary | ICD-10-CM

## 2013-05-15 DIAGNOSIS — I498 Other specified cardiac arrhythmias: Secondary | ICD-10-CM

## 2013-05-15 DIAGNOSIS — Z7901 Long term (current) use of anticoagulants: Secondary | ICD-10-CM

## 2013-05-15 MED ORDER — FLECAINIDE ACETATE 50 MG PO TABS: 50.0000 mg | ORAL_TABLET | Freq: Two times a day (BID) | ORAL | Status: DC

## 2013-05-15 NOTE — Patient Instructions (Signed)
Your physician wants you to follow-up in: 6 weeks with  Dr Johney Frame Bonita Quin will receive a reminder letter in the mail two months in advance. If you don't receive a letter, please call our office to schedule the follow-up appointment.   Your physician has requested that you have a lexiscan myoview. For further information please visit [email or web address] Please follow instruction sheet, as given.--in 2 weeks   Your physician has recommended you make the following change in your medication:  1) Start Flecainide 50mg  twice daily

## 2013-05-19 DIAGNOSIS — I4891 Unspecified atrial fibrillation: Secondary | ICD-10-CM | POA: Insufficient documentation

## 2013-05-19 DIAGNOSIS — I480 Paroxysmal atrial fibrillation: Secondary | ICD-10-CM | POA: Insufficient documentation

## 2013-05-19 NOTE — Progress Notes (Signed)
Primary Care Physician: Cassell Smiles., MD Referring Physician: Nanetta Batty, MD   Jason Alvarez is a 72 y.o. male with a h/o hypertension, diabetes, thrombocytopenia, and paroxysmal atrial fibrillation who presents today for EP follow-up.  He was first diagnosed with atrial fibrillation in 2009.  At that time, he was awakened from sleep with tachypalpitations.  He went to [facility] where he converted to sinus rhythm spontaneously.  Since then, he has had increasing episodes of symptomatic atrial fibrillation.  His episodes are occuring about once a week and usually last about an hour.  They are associated with tachypalpitations and chest discomfort.   He has been maintained on Warfarin for anticoagulation and Toprol for control of atrial fibrillation symptoms.  He reports increasing frequency and duration of his palpitations over the past year.  Presently, they occur most days, lasting up to several hours at a time.   His BP has been elevated.  I have reviewed event monitor from 9/14 which documented frequent episodes of afib with RVR.  At times his arrhythmia is more regular and may represent atrial flutter.  Today, he denies symptoms of chest pain, shortness of breath,  dizziness, presyncope, syncope, or neurologic sequela. The patient is tolerating medications without difficulties and is otherwise without complaint today.   Past Medical History  Diagnosis Date  . Thrombocytopenia   . ITP (idiopathic thrombocytopenic purpura)   . HTN (hypertension)   . DM (diabetes mellitus)   . A-fib     paf with NSVT on MCOT, on coumadin, echo 01/31/08-nl lv, mild aortic valve sclerosis   . Chronic anticoagulation   . Aneurysm   . Anxiety   . Chronic ITP (idiopathic thrombocytopenia) 04/10/2012    Low grade thrombocytopenia consistent with chronic ITP not in need of therapy.   . Carotid bruit     doppler 05/17/12-mild plaque with no diameter reduction  . H/O cardiovascular stress test  01/31/08    no ishcemia, ef 61%  . Hypertension    Past Surgical History  Procedure Laterality Date  . Knee arthroscopy      right and left knee/1992 and 2000  . Vasectomy    . Circumcision    . Colonoscopy  06/25/2012    Procedure: COLONOSCOPY;  Surgeon: Dalia Heading, MD;  Location: [facility];  Service: Gastroenterology;  Laterality: N/A;    Current Outpatient Prescriptions  Medication Sig Dispense Refill  . ALPRAZolam (XANAX) 1 MG tablet Take 1 mg by mouth 3 (three) times daily as needed. For anxiety      . glipiZIDE-metformin (METAGLIP) 5-500 MG per tablet Take 1 tablet by mouth daily.       . hydrochlorothiazide (HYDRODIURIL) 25 MG tablet Take 25 mg by mouth daily.        Marland Kitchen HYDROcodone-acetaminophen (VICODIN) 5-500 MG per tablet Take 1 tablet by mouth every 8 (eight) hours as needed. For pain      . metoprolol succinate (TOPROL-XL) 50 MG 24 hr tablet Take 50 mg by mouth every evening.      . ramipril (ALTACE) 10 MG capsule Take 10 mg by mouth 2 (two) times daily.      Marland Kitchen warfarin (COUMADIN) 5 MG tablet Take 5 mg by mouth daily. Take as directed by coumadin clinic      . flecainide (TAMBOCOR) 50 MG tablet Take 1 tablet (50 mg total) by mouth 2 (two) times daily.  180 tablet  3   No current facility-administered medications for this visit.  Allergies  Allergen Reactions  . Penicillins Other (See Comments)    Childhood allergy    History   Social History  . Marital Status: Married    Spouse Name: N/A    Number of Children: N/A  . Years of Education: N/A   Occupational History  . Not on file.   Social History Main Topics  . Smoking status: Never Smoker   . Smokeless tobacco: Never Used  . Alcohol Use: No  . Drug Use: No  . Sexual Activity:    Other Topics Concern  . Not on file   Social History Narrative  . No narrative on file  Lives in [place] Retired from the [employer] department  Family History  Problem Relation Age of Onset  . Prostate cancer  Father   . Cancer Sister     breast  . Cancer Brother     lung  . Melanoma Mother     ROS- All systems are reviewed and negative except as per the HPI above  Physical Exam: Filed Vitals:   05/15/13 1440  BP: 150/80  Pulse: 56  Height: 6\' 3"  (1.905 m)  Weight: 228 lb 12.8 oz (103.783 kg)    GEN- The patient is well appearing, alert and oriented x 3 today.   Head- normocephalic, atraumatic Eyes-  Sclera clear, conjunctiva pink Ears- hearing intact Oropharynx- clear Neck- supple, no JVP Lymph- no cervical lymphadenopathy Lungs- Clear to ausculation bilaterally, normal work of breathing Heart- Regular rate and rhythm, no murmurs, rubs or gallops, PMI not laterally displaced GI- soft, NT, ND, + BS Extremities- no clubbing, cyanosis, trace R leg edema MS- no significant deformity or atrophy Skin- no rash or lesion Psych- euthymic mood, full affect Neuro- strength and sensation are intact  Event monitor as above Echo 9/14 is reviewed  Assessment and Plan:  1. Paroxysmal atrial fibrillation The patient has symptomatic atrial arrhythmias.  Previously he has had a long RP tachycardia, possible atrial tachycardia.  His event monitor 9/14 reveals afib.  In addition, there are episodes of a more regular tachycardia which may represent atrial flutter.   Therapeutic strategies for his atrial arrhythmias including medicine and ablation were discussed in detail with the patient today.  Risk, benefits, and alternatives to EP study and radiofrequency ablation were also discussed in detail today.   At this time, he would prefer to try flecainide (did not try it after his last visit).  I will therefore restart flecainide 50mg  BID today.  IF he fails medical therapy then he may be more willing to consider ablation. Obtain a lexiscan myoview once maintaining sinus rhythm to evaluate for CAD.  2. HTN Above goal Compliance with norvasc is encouraged.  Consider increasing this medicine upon  return I am not convinced that we can adequately control his arrhythmias if BP remains significantly elevated  Return in 6 weeks

## 2013-05-29 ENCOUNTER — Telehealth: Payer: Self-pay | Admitting: Internal Medicine

## 2013-05-29 NOTE — Telephone Encounter (Signed)
New message    On flecanide----will it make his bp go up?

## 2013-05-29 NOTE — Telephone Encounter (Signed)
Spoke to patient and let him know Flecainide is not for HTN.  He needs to take his Amlodipine for his HTN and if his BP continues to be an issue follow up with Dr Allyson Sabal.  He is going to take his Flecainide and his Amlodipine now and follow up as scheduled with Dr Johney Frame in Dec but if his BP remains elevated he is going to let Dr Allyson Sabal know.

## 2013-06-03 ENCOUNTER — Encounter: Payer: Self-pay | Admitting: Cardiology

## 2013-06-03 ENCOUNTER — Ambulatory Visit (HOSPITAL_COMMUNITY): Payer: PRIVATE HEALTH INSURANCE | Attending: Cardiology | Admitting: Radiology

## 2013-06-03 VITALS — BP 195/70 | Ht 75.0 in | Wt 225.0 lb

## 2013-06-03 DIAGNOSIS — Z8249 Family history of ischemic heart disease and other diseases of the circulatory system: Secondary | ICD-10-CM | POA: Insufficient documentation

## 2013-06-03 DIAGNOSIS — E119 Type 2 diabetes mellitus without complications: Secondary | ICD-10-CM | POA: Insufficient documentation

## 2013-06-03 DIAGNOSIS — I4891 Unspecified atrial fibrillation: Secondary | ICD-10-CM

## 2013-06-03 DIAGNOSIS — I10 Essential (primary) hypertension: Secondary | ICD-10-CM | POA: Insufficient documentation

## 2013-06-03 DIAGNOSIS — R0609 Other forms of dyspnea: Secondary | ICD-10-CM | POA: Insufficient documentation

## 2013-06-03 DIAGNOSIS — I779 Disorder of arteries and arterioles, unspecified: Secondary | ICD-10-CM | POA: Insufficient documentation

## 2013-06-03 DIAGNOSIS — R0989 Other specified symptoms and signs involving the circulatory and respiratory systems: Secondary | ICD-10-CM | POA: Insufficient documentation

## 2013-06-03 MED ORDER — TECHNETIUM TC 99M SESTAMIBI GENERIC - CARDIOLITE
33.0000 | Freq: Once | INTRAVENOUS | Status: AC | PRN
  Administered 2013-06-03: 33 via INTRAVENOUS

## 2013-06-03 MED ORDER — TECHNETIUM TC 99M SESTAMIBI GENERIC - CARDIOLITE
11.0000 | Freq: Once | INTRAVENOUS | Status: AC | PRN
  Administered 2013-06-03: 11 via INTRAVENOUS

## 2013-06-03 MED ORDER — REGADENOSON 0.4 MG/5ML IV SOLN
0.4000 mg | Freq: Once | INTRAVENOUS | Status: AC
  Administered 2013-06-03: 0.4 mg via INTRAVENOUS

## 2013-06-03 NOTE — Progress Notes (Signed)
[facility] [address] NUCLEAR MED [address] [place], [place] [address] [phone]    Cardiology Nuclear Med Study  Jason Alvarez is a 71 y.o. male     MRN : [id number]     DOB: May 19, 1941  Procedure Date: 06/03/2013  Nuclear Med Background Indication for Stress Test:  Evaluation for Ischemia History:  AFIB, ATACH, 2009 MPI: EF: 61%, 03/2013 ECHO: EF: 55-60%  Cardiac Risk Factors: Carotid Disease, Family History - CAD, Hypertension and NIDDM  Symptoms:  DOE   Nuclear Pre-Procedure Caffeine/Decaff Intake:  None NPO After: 7:00pm   Lungs:  clear O2 Sat: 97% on room air. IV 0.9% NS with Angio Cath:  22g  IV Site: R Hand  IV Started by:  Cathlyn Parsons, RN  Chest Size (in):  52 Cup Size: n/a  Height: 6\' 3"  (1.905 m)  Weight:  225 lb (102.059 kg)  BMI:  Body mass index is 28.12 kg/(m^2). Tech Comments:  This patient took his Rx after the stress portion of his test to get his BP down.    Nuclear Med Study 1 or 2 day study: 1 day  Stress Test Type:  Eugenie Birks  Reading MD: Olga Millers, MD  Order Authorizing Provider:  Jarold Song  Resting Radionuclide: Technetium 13m Sestamibi  Resting Radionuclide Dose: 11.0 mCi   Stress Radionuclide:  Technetium 70m Sestamibi  Stress Radionuclide Dose: 33.0 mCi           Stress Protocol Rest HR: 70 Stress HR: 105  Rest BP: 195/70 Stress BP: 194/53  Exercise Time (min): n/a METS: n/a   Predicted Max HR: 148 bpm % Max HR: 70.95 bpm Rate Pressure Product: 21308   Dose of Adenosine (mg):  n/a Dose of Lexiscan: 0.4 mg  Dose of Atropine (mg): n/a Dose of Dobutamine: n/a mcg/kg/min (at max HR)  Stress Test Technologist: Milana Na, EMT-P  Nuclear Technologist:  Domenic Polite, CNMT     Rest Procedure:  Myocardial perfusion imaging was performed at rest 45 minutes following the intravenous administration of Technetium 14m Sestamibi. Rest ECG: NSR - Normal EKG  Stress Procedure:  The patient received IV Lexiscan  0.4 mg over 15-seconds.  Technetium 62m Sestamibi injected at 30-seconds. This patient had sob, and low back pain with the Lexiscan injection. Quantitative spect images were obtained after a 45 minute delay. Stress ECG: No significant ST segment change suggestive of ischemia.  QPS Raw Data Images:  Acquisition technically good; normal left ventricular size. Stress Images:  Normal homogeneous uptake in all areas of the myocardium. Rest Images:  Normal homogeneous uptake in all areas of the myocardium. Subtraction (SDS):  No evidence of ischemia. Transient Ischemic Dilatation (Normal <1.22):  1.02 Lung/Heart Ratio (Normal <0.45):  0.30  Quantitative Gated Spect Images QGS EDV:  125 ml QGS ESV:  57 ml  Impression Exercise Capacity:  Lexiscan with no exercise. BP Response:  Normal blood pressure response. Clinical Symptoms:  There is dyspnea. ECG Impression:  No significant ST segment change suggestive of ischemia. Comparison with Prior Nuclear Study: No change compared to 01/31/08  Overall Impression:  Normal stress nuclear study.  LV Ejection Fraction: 54%.  LV Wall Motion:  NL LV Function; NL Wall Motion   Olga Millers

## 2013-06-09 ENCOUNTER — Other Ambulatory Visit: Payer: Self-pay | Admitting: *Deleted

## 2013-06-09 ENCOUNTER — Other Ambulatory Visit: Payer: Self-pay | Admitting: Cardiovascular Disease

## 2013-06-09 MED ORDER — METOPROLOL SUCCINATE ER 50 MG PO TB24: 50.0000 mg | ORAL_TABLET | Freq: Every evening | ORAL | Status: DC

## 2013-06-09 NOTE — Telephone Encounter (Signed)
Rx was sent to pharmacy electronically. 

## 2013-06-10 ENCOUNTER — Ambulatory Visit (INDEPENDENT_AMBULATORY_CARE_PROVIDER_SITE_OTHER): Payer: PRIVATE HEALTH INSURANCE | Admitting: Pharmacist Clinician (PhC)/ Clinical Pharmacy Specialist

## 2013-06-10 DIAGNOSIS — Z7901 Long term (current) use of anticoagulants: Secondary | ICD-10-CM

## 2013-06-25 ENCOUNTER — Ambulatory Visit (INDEPENDENT_AMBULATORY_CARE_PROVIDER_SITE_OTHER): Payer: PRIVATE HEALTH INSURANCE | Admitting: Internal Medicine

## 2013-06-25 ENCOUNTER — Encounter: Payer: Self-pay | Admitting: Internal Medicine

## 2013-06-25 VITALS — BP 168/73 | HR 63 | Ht 75.0 in | Wt 232.1 lb

## 2013-06-25 DIAGNOSIS — I10 Essential (primary) hypertension: Secondary | ICD-10-CM

## 2013-06-25 DIAGNOSIS — I4891 Unspecified atrial fibrillation: Secondary | ICD-10-CM

## 2013-06-25 NOTE — Patient Instructions (Addendum)
Your physician recommends that you schedule a follow-up appointment in: 3 months with Dr Allyson Sabal and 6 months withDr Allred  Your physician has recommended you make the following change in your medication:  1)Flecainide 50mg  twice daily---ok to take an additional 50mg  if needed if your go into afib

## 2013-06-29 NOTE — Progress Notes (Signed)
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  Since last being seen in our clinic, the patient reports doing very well.  His palpitations have much improved.  He is tolerating flecainide and is pleased with his current health state.  Today, he denies symptoms of chest pain, shortness of breath,  lower extremity edema, dizziness, presyncope, or syncope.  The patient is otherwise without complaint today.   Past Medical History  Diagnosis Date  . Thrombocytopenia   . ITP (idiopathic thrombocytopenic purpura)   . HTN (hypertension)   . DM (diabetes mellitus)   . A-fib     paf with NSVT on MCOT, on coumadin, echo 01/31/08-nl lv, mild aortic valve sclerosis   . Chronic anticoagulation   . Aneurysm   . Anxiety   . Chronic ITP (idiopathic thrombocytopenia) 04/10/2012    Low grade thrombocytopenia consistent with chronic ITP not in need of therapy.   . Carotid bruit     doppler 05/17/12-mild plaque with no diameter reduction  . H/O cardiovascular stress test 01/31/08    no ishcemia, ef 61%  . Hypertension    Past Surgical History  Procedure Laterality Date  . Knee arthroscopy      right and left knee/1992 and 2000  . Vasectomy    . Circumcision    . Colonoscopy  06/25/2012    Procedure: COLONOSCOPY;  Surgeon: Dalia Heading, MD;  Location: [facility];  Service: Gastroenterology;  Laterality: N/A;    Current Outpatient Prescriptions  Medication Sig Dispense Refill  . ALPRAZolam (XANAX) 1 MG tablet Take 1 mg by mouth 3 (three) times daily as needed. For anxiety      . amLODipine (NORVASC) 5 MG tablet Take 1 tablet by mouth daily.      . flecainide (TAMBOCOR) 50 MG tablet Take 1 tablet (50 mg total) by mouth 2 (two) times daily.  180 tablet  3  . glipiZIDE-metformin (METAGLIP) 5-500 MG per tablet Take 1 tablet by mouth daily.       . hydrochlorothiazide (HYDRODIURIL) 25 MG tablet  Take 25 mg by mouth daily.        Marland Kitchen HYDROcodone-acetaminophen (VICODIN) 5-500 MG per tablet Take 1 tablet by mouth every 8 (eight) hours as needed. For pain      . metoprolol succinate (TOPROL-XL) 50 MG 24 hr tablet Take 1 tablet (50 mg total) by mouth every evening.  30 tablet  9  . ramipril (ALTACE) 10 MG capsule Take 10 mg by mouth 2 (two) times daily.      Marland Kitchen warfarin (COUMADIN) 5 MG tablet Take 5 mg by mouth daily. Take as directed by coumadin clinic       No current facility-administered medications for this visit.    Physical Exam: Filed Vitals:   06/25/13 0848  BP: 168/73  Pulse: 63  Height: 6\' 3"  (1.905 m)  Weight: 232 lb 1.9 oz (105.289 kg)    GEN- The patient is well appearing, alert and oriented x 3 today.   Head- normocephalic, atraumatic Eyes-  Sclera clear, conjunctiva pink Ears- hearing intact Oropharynx- clear Lungs- Clear to ausculation bilaterally, normal work of breathing Heart- Regular rate and rhythm, no murmurs, rubs or gallops, PMI not laterally displaced GI- soft, NT, ND, + BS Extremities- no clubbing, cyanosis, or edema  ekg today reveals sinus rhythm, normal ekg 06/04/23 myoview reviewed  Assessment and Plan:  1. afib Well  controlled with flecainide 50mg  BID If he has episodic afib then he can take an additional 50mg  of flecainide.  We could increase flecainide to 100mg  BID if necessary. As he is doing well with medical therapy, we will avoid ablation presently though this would be an options if he fails medical therapy with flecainide. Continue anticoagulation  2. htn Stable No change required today  Follow-up with Dr Allyson Sabal in 3 months I will see in 6 months

## 2013-07-03 ENCOUNTER — Other Ambulatory Visit: Payer: Self-pay | Admitting: Cardiovascular Disease

## 2013-07-03 LAB — PROTIME-INR: INR: 2.7 — ABNORMAL HIGH (ref <1.50)

## 2013-07-04 ENCOUNTER — Ambulatory Visit (INDEPENDENT_AMBULATORY_CARE_PROVIDER_SITE_OTHER): Payer: PRIVATE HEALTH INSURANCE | Admitting: Pharmacist Clinician (PhC)/ Clinical Pharmacy Specialist

## 2013-07-04 DIAGNOSIS — Z7901 Long term (current) use of anticoagulants: Secondary | ICD-10-CM

## 2013-08-06 ENCOUNTER — Other Ambulatory Visit: Payer: Self-pay | Admitting: Cardiovascular Disease

## 2013-08-06 LAB — PROTIME-INR
INR: 2.55 — ABNORMAL HIGH (ref <1.50)
PROTHROMBIN TIME: 26.8 s — AB (ref 11.6–15.2)

## 2013-08-07 ENCOUNTER — Ambulatory Visit (INDEPENDENT_AMBULATORY_CARE_PROVIDER_SITE_OTHER): Payer: PRIVATE HEALTH INSURANCE | Admitting: Pharmacist Clinician (PhC)/ Clinical Pharmacy Specialist

## 2013-08-07 DIAGNOSIS — Z7901 Long term (current) use of anticoagulants: Secondary | ICD-10-CM

## 2013-09-08 ENCOUNTER — Other Ambulatory Visit: Payer: Self-pay | Admitting: Cardiovascular Disease

## 2013-09-08 LAB — PROTIME-INR
INR: 3.08 — ABNORMAL HIGH (ref <1.50)
Prothrombin Time: 30.9 seconds — ABNORMAL HIGH (ref 11.6–15.2)

## 2013-09-12 ENCOUNTER — Telehealth: Payer: Self-pay | Admitting: *Deleted

## 2013-09-12 ENCOUNTER — Ambulatory Visit (INDEPENDENT_AMBULATORY_CARE_PROVIDER_SITE_OTHER): Payer: PRIVATE HEALTH INSURANCE | Admitting: Pharmacist Clinician (PhC)/ Clinical Pharmacy Specialist

## 2013-09-12 DIAGNOSIS — Z7901 Long term (current) use of anticoagulants: Secondary | ICD-10-CM

## 2013-09-12 NOTE — Telephone Encounter (Signed)
Pt had his INR run on Monday and he wants the results.  JB

## 2013-09-12 NOTE — Telephone Encounter (Signed)
INR 3.08 on 2.23.15.  Tommy Medal, PharmD and Dr. Gwenlyn Found out of the office.  Spoke w/ Curt Bears, RN, Dr. Kennon Holter nurse, and advised send message to Erasmo Downer to address on Monday as pt is slightly over goal.    Returned call and pt verified x 2.  Pt informed and verbalized understanding.  Pt also advised to call the office for after-hours provider if any advice needed over the weekend, otherwise Erasmo Downer will contact him on Monday.  Pt verbalized understanding and agreed w/ plan.  Message forwarded to Tommy Medal, PharmD.

## 2013-09-22 ENCOUNTER — Encounter: Payer: Self-pay | Admitting: Cardiovascular Disease

## 2013-09-22 ENCOUNTER — Ambulatory Visit (INDEPENDENT_AMBULATORY_CARE_PROVIDER_SITE_OTHER): Payer: PRIVATE HEALTH INSURANCE | Admitting: Cardiovascular Disease

## 2013-09-22 VITALS — BP 170/78 | Ht 75.0 in | Wt 235.0 lb

## 2013-09-22 DIAGNOSIS — I10 Essential (primary) hypertension: Secondary | ICD-10-CM

## 2013-09-22 DIAGNOSIS — I4891 Unspecified atrial fibrillation: Secondary | ICD-10-CM

## 2013-09-22 NOTE — Assessment & Plan Note (Signed)
The patient saw Dr. Thompson Grayer  at my requested. He felt that his rhythm was atrial tachycardia. He was already on Coumadin anticoagulation. He added flecainide 100 mg by mouth twice a day. A Myoview stress test performed in November was nonischemic. Since starting the flecainide he believes his palpitations are less noticeable and frequent.

## 2013-09-22 NOTE — Progress Notes (Signed)
09/22/2013 Jason Alvarez   1940/10/04  [id number]  Primary Physician Jason Alvarez., MD Primary Cardiologist: Jason Harp MD Jason Alvarez   HPI:  The patient is a very pleasant 73 year old mildly to moderately overweight married Caucasian male father of 2 who I last saw in April of 2012. He was initially sent to me for claudication. His risk factors include hypertension, non-insulin-requiring diabetes, and paroxysmal atrial fibrillation with nonsustained ventricular tachycardia on MCOT. He has mild sleep apnea without requiring CPAP. His LV function is normal by echo, and a Myoview was negative back in 2009. He is on Coumadin anticoagulation. He is otherwise asymptomatic. His last lipid profile performed in January revealed a total cholesterol of 170, LDL of 103, and HDL or 40.since I last saw him in October last year he relates increased frequency and duration of his A. Fib episodes. He denies chest pain or shortness of breath. He saw Dr. Thompson Alvarez at my request thought that his rhythm abnormality was atrial tachycardia.a Myoview stress test performed in November with nonischemic. It is beginning to tonight he believes that his palpitations are less noticeable and frequent. Dr. Rayann Alvarez did discuss the possibility of ablation which the patient declined.     Current Outpatient Prescriptions  Medication Sig Dispense Refill  . ALPRAZolam (XANAX) 1 MG tablet Take 1 mg by mouth 3 (three) times daily as needed. For anxiety      . amLODipine (NORVASC) 5 MG tablet Take 1 tablet by mouth daily.      . flecainide (TAMBOCOR) 50 MG tablet Take 1 tablet (50 mg total) by mouth 2 (two) times daily.  180 tablet  3  . glipiZIDE-metformin (METAGLIP) 5-500 MG per tablet Take 1 tablet by mouth daily.       . hydrochlorothiazide (HYDRODIURIL) 25 MG tablet Take 25 mg by mouth daily.        Marland Kitchen HYDROcodone-acetaminophen (VICODIN) 5-500 MG per tablet Take 1 tablet by mouth every 8 (eight) hours  as needed. For pain      . metoprolol succinate (TOPROL-XL) 50 MG 24 hr tablet Take 1 tablet (50 mg total) by mouth every evening.  30 tablet  9  . ramipril (ALTACE) 10 MG capsule Take 10 mg by mouth 2 (two) times daily.      Marland Kitchen warfarin (COUMADIN) 5 MG tablet Take 5 mg by mouth daily. Take as directed by coumadin clinic       No current facility-administered medications for this visit.    Allergies  Allergen Reactions  . Penicillins Other (See Comments)    Childhood allergy    History   Social History  . Marital Status: Married    Spouse Name: N/A    Number of Children: N/A  . Years of Education: N/A   Occupational History  . Not on file.   Social History Main Topics  . Smoking status: Never Smoker   . Smokeless tobacco: Never Used  . Alcohol Use: No  . Drug Use: No  . Sexual Activity:    Other Topics Concern  . Not on file   Social History Narrative  . No narrative on file     Review of Systems: General: negative for chills, fever, night sweats or weight changes.  Cardiovascular: negative for chest pain, dyspnea on exertion, edema, orthopnea, palpitations, paroxysmal nocturnal dyspnea or shortness of breath Dermatological: negative for rash Respiratory: negative for cough or wheezing Urologic: negative for hematuria Abdominal: negative for nausea, vomiting, diarrhea, bright red  blood per rectum, melena, or hematemesis Neurologic: negative for visual changes, syncope, or dizziness All other systems reviewed and are otherwise negative except as noted above.    Blood pressure 170/78, height 6\' 3"  (1.905 m), weight 106.595 kg (235 lb).  General appearance: alert and no distress Neck: no adenopathy, no carotid bruit, no JVD, supple, symmetrical, trachea midline and thyroid not enlarged, symmetric, no tenderness/mass/nodules Lungs: clear to auscultation bilaterally Heart: regular rate and rhythm, S1, S2 normal, no murmur, click, rub or gallop Extremities:  extremities normal, atraumatic, no cyanosis or edema  EKG sinus bradycardia at 55 without ST or T wave changes. The QTC was 430 ms  ASSESSMENT AND PLAN:   Atrial fibrillation The patient saw Dr. Thompson Alvarez  at my requested. He felt that his rhythm was atrial tachycardia. He was already on Coumadin anticoagulation. He added flecainide 100 mg by mouth twice a day. A Myoview stress test performed in November was nonischemic. Since starting the flecainide he believes his palpitations are less noticeable and frequent.  HYPERTENSION Under good control on current medications      Jason Harp MD [facility] 09/22/2013 4:00 PM

## 2013-09-22 NOTE — Patient Instructions (Signed)
Your physician recommends that you schedule a follow-up appointment in: 6 Months with the PA within the office  and back in 1 Year with Dr. Gwenlyn Found.

## 2013-09-22 NOTE — Assessment & Plan Note (Signed)
Under good control on current medications 

## 2013-09-23 ENCOUNTER — Ambulatory Visit: Payer: PRIVATE HEALTH INSURANCE | Admitting: Cardiovascular Disease

## 2013-10-06 ENCOUNTER — Other Ambulatory Visit: Payer: Self-pay | Admitting: Cardiovascular Disease

## 2013-10-06 LAB — PROTIME-INR
INR: 3.68 — AB (ref <1.50)
PROTHROMBIN TIME: 35.4 s — AB (ref 11.6–15.2)

## 2013-10-07 ENCOUNTER — Ambulatory Visit (INDEPENDENT_AMBULATORY_CARE_PROVIDER_SITE_OTHER): Payer: PRIVATE HEALTH INSURANCE | Admitting: Pharmacist Clinician (PhC)/ Clinical Pharmacy Specialist

## 2013-10-07 DIAGNOSIS — Z7901 Long term (current) use of anticoagulants: Secondary | ICD-10-CM

## 2013-10-14 ENCOUNTER — Telehealth: Payer: Self-pay | Admitting: Pharmacist Clinician (PhC)/ Clinical Pharmacy Specialist

## 2013-10-14 NOTE — Telephone Encounter (Signed)
Returned call, explained that Invokana is fine with warfarin - no interactions.  He then stated he was given rx for cipro 500mg  bid x 10days to start this evening.  Advised him that would be best to repeat INR Friday to see if the INR increased on the combination.  Pt stated he hasn't decided if he needs the antibiotic or not.  Filled it but won't take for another day or two at the earliest.  Advised that if he starts to get INR checked about day 4-5.  Pt voiced understanding.

## 2013-10-14 NOTE — Telephone Encounter (Signed)
Pt LMOM asking about Invokana

## 2013-10-20 ENCOUNTER — Other Ambulatory Visit: Payer: Self-pay | Admitting: Cardiovascular Disease

## 2013-10-20 ENCOUNTER — Ambulatory Visit (INDEPENDENT_AMBULATORY_CARE_PROVIDER_SITE_OTHER): Payer: PRIVATE HEALTH INSURANCE | Admitting: Pharmacist Clinician (PhC)/ Clinical Pharmacy Specialist

## 2013-10-20 DIAGNOSIS — Z7901 Long term (current) use of anticoagulants: Secondary | ICD-10-CM

## 2013-10-20 LAB — PROTIME-INR
INR: 2.28 — ABNORMAL HIGH (ref <1.50)
Prothrombin Time: 24.6 seconds — ABNORMAL HIGH (ref 11.6–15.2)

## 2013-11-17 ENCOUNTER — Other Ambulatory Visit: Payer: Self-pay | Admitting: Cardiovascular Disease

## 2013-11-17 LAB — PROTIME-INR
INR: 2.62 — ABNORMAL HIGH (ref <1.50)
Prothrombin Time: 27.3 seconds — ABNORMAL HIGH (ref 11.6–15.2)

## 2013-11-18 ENCOUNTER — Ambulatory Visit (INDEPENDENT_AMBULATORY_CARE_PROVIDER_SITE_OTHER): Payer: PRIVATE HEALTH INSURANCE | Admitting: Pharmacist Clinician (PhC)/ Clinical Pharmacy Specialist

## 2013-11-18 DIAGNOSIS — Z7901 Long term (current) use of anticoagulants: Secondary | ICD-10-CM

## 2013-12-02 ENCOUNTER — Telehealth: Payer: Self-pay

## 2013-12-02 DIAGNOSIS — M7989 Other specified soft tissue disorders: Secondary | ICD-10-CM

## 2013-12-02 DIAGNOSIS — M79609 Pain in unspecified limb: Secondary | ICD-10-CM

## 2013-12-02 NOTE — Telephone Encounter (Signed)
Phone call from pt.  Reported onset, one - two days ago, of "a knot in the left mid-calf area, that is a little smaller than walnut-size."   Stated the area is slightly warm, but not red. Reported there is slight swelling in the left lower leg.  C/o a cramping in the left calf with weight bearing.  Stated the knot is palpable but not noticeable, visually.  Stated the knot is soft; c/o soreness when he presses on it.  Pt. is on Warfarin for A- Fib.  Discussed with Dr. Kellie Simmering.  Recommends to schedule venous duplex of left LE and office visit, to further evaluate for Thrombophlebitis.

## 2013-12-02 NOTE — Telephone Encounter (Signed)
Questioned pt. about his intention for treatment of the right leg varicosities.  Stated he will hold off on having those treated at this time.

## 2013-12-03 ENCOUNTER — Encounter: Payer: Self-pay | Admitting: Vascular Surgery

## 2013-12-03 ENCOUNTER — Ambulatory Visit (HOSPITAL_COMMUNITY)
Admission: RE | Admit: 2013-12-03 | Discharge: 2013-12-03 | Disposition: A | Discharge status: Home / Self Care | Payer: PRIVATE HEALTH INSURANCE | Source: Ambulatory Visit | Attending: Vascular Surgery | Admitting: Vascular Surgery

## 2013-12-03 ENCOUNTER — Encounter (INDEPENDENT_AMBULATORY_CARE_PROVIDER_SITE_OTHER): Payer: Self-pay

## 2013-12-03 ENCOUNTER — Ambulatory Visit (INDEPENDENT_AMBULATORY_CARE_PROVIDER_SITE_OTHER): Payer: PRIVATE HEALTH INSURANCE | Admitting: Vascular Surgery

## 2013-12-03 VITALS — BP 188/75 | HR 63 | Resp 18 | Ht 75.0 in | Wt 228.5 lb

## 2013-12-03 DIAGNOSIS — M7989 Other specified soft tissue disorders: Secondary | ICD-10-CM

## 2013-12-03 DIAGNOSIS — M79609 Pain in unspecified limb: Secondary | ICD-10-CM | POA: Insufficient documentation

## 2013-12-03 NOTE — Progress Notes (Signed)
[facility] [place] HISTORY AND PHYSICAL   History of Present Illness:  Patient is a 73 y.o. year old male who presents for evaluation of a left leg mass. The patient noticed a lump in the medial aspect of his left leg approximately 3 days ago. It was not present prior to that. It is nonpainful. He denies any trauma. He does not know if it is expanding but he has not really noticed any changes in the last 3 days. He does have a history of varicose veins are previously saw my partner Dr. Kellie Simmering in 2013. At that point it was recommended that he consider laser ablation and stab avulsions of right leg varicosities. He was also recommended that the patient wear compression stockings. However, he deferred the treatment options. He states his varicose veins of not really been bothering him much. He has had no skin breakdown. He is on chronic anticoagulation for atrial fibrillation. Other medical problems include thrombocytopenia, hypertension, diabetes all of which are currently stable.  Past Medical History  Diagnosis Date  . Thrombocytopenia   . ITP (idiopathic thrombocytopenic purpura)   . HTN (hypertension)   . DM (diabetes mellitus)   . A-fib     paf with NSVT on MCOT, on coumadin, echo 01/31/08-nl lv, mild aortic valve sclerosis   . Chronic anticoagulation   . Aneurysm   . Anxiety   . Chronic ITP (idiopathic thrombocytopenia) 04/10/2012    Low grade thrombocytopenia consistent with chronic ITP not in need of therapy.   . Carotid bruit     doppler 05/17/12-mild plaque with no diameter reduction  . H/O cardiovascular stress test 01/31/08    no ishcemia, ef 61%  . Hypertension     Past Surgical History  Procedure Laterality Date  . Knee arthroscopy      right and left knee/1992 and 2000  . Vasectomy    . Circumcision    . Colonoscopy  06/25/2012    Procedure: COLONOSCOPY;  Surgeon: Jamesetta So, MD;  Location: [facility];  Service: Gastroenterology;  Laterality:  N/A;    Social History History  Substance Use Topics  . Smoking status: Never Smoker   . Smokeless tobacco: Never Used  . Alcohol Use: No    Family History Family History  Problem Relation Age of Onset  . Prostate cancer Father   . Cancer Sister     breast  . Cancer Brother     lung  . Melanoma Mother     Allergies  Allergies  Allergen Reactions  . Penicillins Other (See Comments)    Childhood allergy     Current Outpatient Prescriptions  Medication Sig Dispense Refill  . ALPRAZolam (XANAX) 1 MG tablet Take 1 mg by mouth 3 (three) times daily as needed. For anxiety      . flecainide (TAMBOCOR) 50 MG tablet Take 1 tablet (50 mg total) by mouth 2 (two) times daily.  180 tablet  3  . glipiZIDE-metformin (METAGLIP) 5-500 MG per tablet Take 1 tablet by mouth daily.       . hydrochlorothiazide (HYDRODIURIL) 25 MG tablet Take 25 mg by mouth daily.        Marland Kitchen HYDROcodone-acetaminophen (VICODIN) 5-500 MG per tablet Take 1 tablet by mouth every 8 (eight) hours as needed. For pain      . metoprolol succinate (TOPROL-XL) 50 MG 24 hr tablet Take 1 tablet (50 mg total) by mouth every evening.  30 tablet  9  . ramipril (ALTACE)  10 MG capsule Take 10 mg by mouth 2 (two) times daily.      Marland Kitchen warfarin (COUMADIN) 5 MG tablet Take 5 mg by mouth daily. Take as directed by coumadin clinic      . amLODipine (NORVASC) 5 MG tablet Take 1 tablet by mouth daily.       No current facility-administered medications for this visit.    ROS:   General:  No weight loss, Fever, chills  HEENT: No recent headaches, no nasal bleeding, no visual changes, no sore throat  Neurologic: No dizziness, blackouts, seizures. No recent symptoms of stroke or mini- stroke. No recent episodes of slurred speech, or temporary blindness.  Cardiac: No recent episodes of chest pain/pressure, no shortness of breath at rest.  No shortness of breath with exertion.  Denies history of atrial fibrillation or irregular  heartbeat  Vascular: No history of rest pain in feet.  No history of claudication.  No history of non-healing ulcer, No history of DVT   Pulmonary: No home oxygen, no productive cough, no hemoptysis,  No asthma or wheezing  Musculoskeletal:  [ ]  Arthritis, [ ]  Low back pain,  [ ]  Joint pain  Hematologic:No history of hypercoagulable state.  No history of easy bleeding.  No history of anemia  Gastrointestinal: No hematochezia or melena,  No gastroesophageal reflux, no trouble swallowing  Urinary: [ ]  chronic Kidney disease, [ ]  on HD - [ ]  MWF or [ ]  TTHS, [ ]  Burning with urination, [ ]  Frequent urination, [ ]  Difficulty urinating;   Skin: No rashes  Psychological: No history of anxiety,  No history of depression   Physical Examination  Filed Vitals:   12/03/13 1354  BP: 188/75  Pulse: 63  Resp: 18  Height: 6\' 3"  (1.905 m)  Weight: 228 lb 8 oz (103.647 kg)    Body mass index is 28.56 kg/(m^2).  General:  Alert and oriented, no acute distress HEENT: Normal Neck: No bruit or JVD Pulmonary: Clear to auscultation bilaterally Cardiac: Regular Rate and Rhythm without murmur Abdomen: Soft, non-tender, non-distended, no mass, no scars Skin: No rash Extremity Pulses:  2+ radial, brachial, femoral, dorsalis pedis, posterior tibial pulses bilaterally Musculoskeletal: No deformity or edema, 4 x 3 cm mass medial left leg nontender deep to the subcutaneous tissue unable to determine if it is fixed into its depth  Neurologic: Upper and lower extremity motor 5/5 and symmetric  DATA:  Patient had a venous duplex exam today. This showed no evidence of DVT or superficial thrombophlebitis in the left lower extremity. There is no reflux in the left leg. There was a soft tissue mass between the muscle and fascia over the area that the patient complains   ASSESSMENT:  Left leg mass no evidence of DVT or superficial thrombophlebitis. This does not appear to be a vascular issue. Potentially this  could represent hematoma since the patient is on chronic anticoagulation. Although, he does not recall any trauma to this area.   PLAN:  The mass has been present for a few days per patient report. We will give this a few weeks to see if it resolves spontaneously. If not we will consider an MRI exam of this area to rule out a mass lesion. The patient was given a renewed prescription today for bilateral lower extremity compression stockings for his varicose veins and leg swelling.  Ruta Hinds, MD [facility] Office: [phone] Pager: [phone]

## 2013-12-10 ENCOUNTER — Telehealth: Payer: Self-pay | Admitting: Cardiovascular Disease

## 2013-12-11 ENCOUNTER — Other Ambulatory Visit: Payer: Self-pay | Admitting: Cardiovascular Disease

## 2013-12-11 LAB — PROTIME-INR
INR: 3.12 — AB (ref <1.50)
Prothrombin Time: 31.2 seconds — ABNORMAL HIGH (ref 11.6–15.2)

## 2013-12-12 ENCOUNTER — Ambulatory Visit (INDEPENDENT_AMBULATORY_CARE_PROVIDER_SITE_OTHER): Payer: PRIVATE HEALTH INSURANCE | Admitting: Pharmacist Clinician (PhC)/ Clinical Pharmacy Specialist

## 2013-12-12 DIAGNOSIS — Z7901 Long term (current) use of anticoagulants: Secondary | ICD-10-CM

## 2013-12-19 NOTE — Telephone Encounter (Signed)
Closed encounter °

## 2013-12-22 ENCOUNTER — Ambulatory Visit (INDEPENDENT_AMBULATORY_CARE_PROVIDER_SITE_OTHER): Payer: PRIVATE HEALTH INSURANCE | Admitting: Internal Medicine

## 2013-12-22 ENCOUNTER — Encounter: Payer: Self-pay | Admitting: Internal Medicine

## 2013-12-22 VITALS — BP 178/87 | HR 55 | Ht 75.0 in | Wt 234.8 lb

## 2013-12-22 DIAGNOSIS — I10 Essential (primary) hypertension: Secondary | ICD-10-CM

## 2013-12-22 DIAGNOSIS — Z7901 Long term (current) use of anticoagulants: Secondary | ICD-10-CM

## 2013-12-22 DIAGNOSIS — I498 Other specified cardiac arrhythmias: Secondary | ICD-10-CM

## 2013-12-22 DIAGNOSIS — I4891 Unspecified atrial fibrillation: Secondary | ICD-10-CM

## 2013-12-22 DIAGNOSIS — I471 Supraventricular tachycardia: Secondary | ICD-10-CM

## 2013-12-22 NOTE — Progress Notes (Signed)
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  Since last being seen in our clinic, the patient reports doing very well.    He is tolerating flecainide and his afib has improved.  Episodes are rare and short lived.  Today, he denies symptoms of chest pain, shortness of breath,  lower extremity edema, dizziness, presyncope, or syncope.  The patient is otherwise without complaint today.   Past Medical History  Diagnosis Date  . Thrombocytopenia   . ITP (idiopathic thrombocytopenic purpura)   . HTN (hypertension)   . DM (diabetes mellitus)   . A-fib     paf with NSVT on MCOT, on coumadin, echo 01/31/08-nl lv, mild aortic valve sclerosis   . Chronic anticoagulation   . Aneurysm   . Anxiety   . Chronic ITP (idiopathic thrombocytopenia) 04/10/2012    Low grade thrombocytopenia consistent with chronic ITP not in need of therapy.   . Carotid bruit     doppler 05/17/12-mild plaque with no diameter reduction  . H/O cardiovascular stress test 01/31/08    no ishcemia, ef 61%  . Hypertension    Past Surgical History  Procedure Laterality Date  . Knee arthroscopy      right and left knee/1992 and 2000  . Vasectomy    . Circumcision    . Colonoscopy  06/25/2012    Procedure: COLONOSCOPY;  Surgeon: Jamesetta So, MD;  Location: [facility];  Service: Gastroenterology;  Laterality: N/A;    Current Outpatient Prescriptions  Medication Sig Dispense Refill  . ALPRAZolam (XANAX) 1 MG tablet Take 1 mg by mouth 3 (three) times daily as needed. For anxiety      . flecainide (TAMBOCOR) 50 MG tablet Take 1 tablet (50 mg total) by mouth 2 (two) times daily.  180 tablet  3  . glipiZIDE-metformin (METAGLIP) 5-500 MG per tablet Take 1 tablet by mouth daily.       . hydrochlorothiazide (HYDRODIURIL) 25 MG tablet Take 25 mg by mouth daily.        Marland Kitchen HYDROcodone-acetaminophen (VICODIN) 5-500 MG per tablet  Take 1 tablet by mouth every 8 (eight) hours as needed. For pain      . metoprolol succinate (TOPROL-XL) 50 MG 24 hr tablet Take 1 tablet (50 mg total) by mouth every evening.  30 tablet  9  . ramipril (ALTACE) 10 MG capsule Take 10 mg by mouth 2 (two) times daily.      Marland Kitchen warfarin (COUMADIN) 5 MG tablet Take 5 mg by mouth daily. Take as directed by coumadin clinic      . amLODipine (NORVASC) 5 MG tablet Take 1 tablet by mouth daily. Pt states he stopped taking this on his own about a month ago (12/22/13)       No current facility-administered medications for this visit.   ROS- all systems are reviewed and negative except as per HPI  Physical Exam: Filed Vitals:   12/22/13 1009  BP: 178/87  Pulse: 55  Height: 6\' 3"  (1.905 m)  Weight: 234 lb 12.8 oz (106.505 kg)    GEN- The patient is well appearing, alert and oriented x 3 today.   Head- normocephalic, atraumatic Eyes-  Sclera clear, conjunctiva pink Ears- hearing intact Oropharynx- clear Lungs- Clear to ausculation bilaterally, normal work of breathing Heart- Regular rate and rhythm, no murmurs, rubs or gallops, PMI not laterally displaced GI- soft, NT, ND, + BS  Extremities- no clubbing, cyanosis, or edema  ekg today reveals sinus rhythm 55 bpm, normal ekg  Assessment and Plan:  1. afib Well controlled with flecainide 50mg  BID If he has episodic afib then he can take an additional 50mg  of flecainide.  We could increase flecainide to 100mg  BID if necessary. As he is doing well with medical therapy, we will avoid ablation presently though this would be an options if he fails medical therapy with flecainide. Continue anticoagulation  2. htn Elevated BP control is essential to maintaining sinus rhythm long term He stopped his norvasc for unknown reasons.  We will restart today.  Follow-up with Dr Gwenlyn Found in 3 months I will see as needed Once [name] has established the afib clinic, he would be a good candidate for follow-up  with her

## 2013-12-22 NOTE — Patient Instructions (Signed)
Your physician recommends that you continue on your current medications as directed. Please refer to the Current Medication list given to you today.  RESTART YOUR AMLODIPINE  No follow up is needed at this time with Dr. Rayann Heman.  Please keep follow up with Dr. Gwenlyn Found & Dr. Gerarda Fraction

## 2014-01-06 ENCOUNTER — Other Ambulatory Visit: Payer: Self-pay | Admitting: Cardiovascular Disease

## 2014-01-06 LAB — PROTIME-INR
INR: 2.95 — AB (ref <1.50)
PROTHROMBIN TIME: 29.9 s — AB (ref 11.6–15.2)

## 2014-01-07 ENCOUNTER — Ambulatory Visit (INDEPENDENT_AMBULATORY_CARE_PROVIDER_SITE_OTHER): Payer: PRIVATE HEALTH INSURANCE | Admitting: Pharmacist Clinician (PhC)/ Clinical Pharmacy Specialist

## 2014-01-07 DIAGNOSIS — Z7901 Long term (current) use of anticoagulants: Secondary | ICD-10-CM

## 2014-01-08 ENCOUNTER — Ambulatory Visit: Payer: PRIVATE HEALTH INSURANCE | Admitting: Vascular Surgery

## 2014-02-09 ENCOUNTER — Other Ambulatory Visit: Payer: Self-pay | Admitting: Cardiovascular Disease

## 2014-02-09 ENCOUNTER — Ambulatory Visit (INDEPENDENT_AMBULATORY_CARE_PROVIDER_SITE_OTHER): Payer: PRIVATE HEALTH INSURANCE | Admitting: Pharmacist Clinician (PhC)/ Clinical Pharmacy Specialist

## 2014-02-09 DIAGNOSIS — Z7901 Long term (current) use of anticoagulants: Secondary | ICD-10-CM

## 2014-02-09 LAB — PT WITH INR/FINGERSTICK
INR, fingerstick: 3.5 — ABNORMAL HIGH (ref 0.80–1.20)
PT FINGERSTICK: 41.7 s — AB (ref 10.4–12.5)

## 2014-03-02 ENCOUNTER — Ambulatory Visit (INDEPENDENT_AMBULATORY_CARE_PROVIDER_SITE_OTHER): Payer: PRIVATE HEALTH INSURANCE | Admitting: Pharmacist Clinician (PhC)/ Clinical Pharmacy Specialist

## 2014-03-02 ENCOUNTER — Other Ambulatory Visit: Payer: Self-pay | Admitting: Cardiovascular Disease

## 2014-03-02 DIAGNOSIS — Z7901 Long term (current) use of anticoagulants: Secondary | ICD-10-CM

## 2014-03-02 LAB — PT WITH INR/FINGERSTICK
INR, fingerstick: 2.5 — ABNORMAL HIGH (ref 0.80–1.20)
PT, fingerstick: 29.9 seconds — ABNORMAL HIGH (ref 10.4–12.5)

## 2014-03-24 ENCOUNTER — Ambulatory Visit (INDEPENDENT_AMBULATORY_CARE_PROVIDER_SITE_OTHER): Payer: PRIVATE HEALTH INSURANCE | Admitting: Pharmacist Clinician (PhC)/ Clinical Pharmacy Specialist

## 2014-03-24 ENCOUNTER — Other Ambulatory Visit: Payer: Self-pay | Admitting: Cardiovascular Disease

## 2014-03-24 DIAGNOSIS — Z7901 Long term (current) use of anticoagulants: Secondary | ICD-10-CM

## 2014-03-24 LAB — PT WITH INR/FINGERSTICK
INR, fingerstick: 4.4 — ABNORMAL HIGH (ref 0.80–1.20)
PT, fingerstick: 52.7 seconds — ABNORMAL HIGH (ref 10.4–12.5)

## 2014-03-28 ENCOUNTER — Other Ambulatory Visit: Payer: Self-pay | Admitting: Cardiovascular Disease

## 2014-03-30 NOTE — Telephone Encounter (Signed)
Rx was sent to pharmacy electronically. 

## 2014-04-06 ENCOUNTER — Other Ambulatory Visit: Payer: Self-pay | Admitting: Cardiovascular Disease

## 2014-04-06 ENCOUNTER — Ambulatory Visit (INDEPENDENT_AMBULATORY_CARE_PROVIDER_SITE_OTHER): Payer: PRIVATE HEALTH INSURANCE | Admitting: Pharmacist Clinician (PhC)/ Clinical Pharmacy Specialist

## 2014-04-06 DIAGNOSIS — Z7901 Long term (current) use of anticoagulants: Secondary | ICD-10-CM

## 2014-04-06 LAB — PT WITH INR/FINGERSTICK
INR FINGERSTICK: 3.6 — AB (ref 0.80–1.20)
PT, fingerstick: 43.1 seconds — ABNORMAL HIGH (ref 10.4–12.5)

## 2014-04-20 ENCOUNTER — Other Ambulatory Visit: Payer: Self-pay | Admitting: Cardiovascular Disease

## 2014-04-20 LAB — PT WITH INR/FINGERSTICK
INR, fingerstick: 2.9 — ABNORMAL HIGH (ref 0.80–1.20)
PT, fingerstick: 35 seconds — ABNORMAL HIGH (ref 10.4–12.5)

## 2014-04-21 ENCOUNTER — Ambulatory Visit (INDEPENDENT_AMBULATORY_CARE_PROVIDER_SITE_OTHER): Payer: PRIVATE HEALTH INSURANCE | Admitting: Pharmacist Clinician (PhC)/ Clinical Pharmacy Specialist

## 2014-05-02 ENCOUNTER — Other Ambulatory Visit: Payer: Self-pay | Admitting: Internal Medicine

## 2014-05-02 NOTE — Telephone Encounter (Signed)
Rx was sent to pharmacy electronically. 

## 2014-05-11 ENCOUNTER — Other Ambulatory Visit: Payer: Self-pay | Admitting: Cardiovascular Disease

## 2014-05-11 ENCOUNTER — Ambulatory Visit (INDEPENDENT_AMBULATORY_CARE_PROVIDER_SITE_OTHER): Payer: PRIVATE HEALTH INSURANCE | Admitting: Pharmacist Clinician (PhC)/ Clinical Pharmacy Specialist

## 2014-05-11 LAB — PT WITH INR/FINGERSTICK
INR FINGERSTICK: 2.6 — AB (ref 0.80–1.20)
PT, fingerstick: 31 seconds — ABNORMAL HIGH (ref 10.4–12.5)

## 2014-06-08 ENCOUNTER — Telehealth: Payer: Self-pay | Admitting: Cardiovascular Disease

## 2014-06-08 ENCOUNTER — Other Ambulatory Visit: Payer: Self-pay | Admitting: Cardiovascular Disease

## 2014-06-08 NOTE — Telephone Encounter (Signed)
Gave verbal order to do lab draw via venipuncture instead of fingerstick

## 2014-06-09 ENCOUNTER — Ambulatory Visit (INDEPENDENT_AMBULATORY_CARE_PROVIDER_SITE_OTHER): Payer: PRIVATE HEALTH INSURANCE | Admitting: Pharmacist Clinician (PhC)/ Clinical Pharmacy Specialist

## 2014-06-09 LAB — PROTIME-INR
INR: 1.9 — ABNORMAL HIGH (ref <1.50)
PROTHROMBIN TIME: 21.8 s — AB (ref 11.6–15.2)

## 2014-07-02 ENCOUNTER — Telehealth: Payer: Self-pay | Admitting: Cardiovascular Disease

## 2014-07-02 ENCOUNTER — Other Ambulatory Visit: Payer: Self-pay | Admitting: Cardiovascular Disease

## 2014-07-02 LAB — PROTIME-INR
INR: 2.49 — ABNORMAL HIGH (ref <1.50)
PROTHROMBIN TIME: 26.9 s — AB (ref 11.6–15.2)

## 2014-07-02 NOTE — Telephone Encounter (Signed)
Spoke with [place] from [place]. Patient used to use a location that did fingersticks, but their location does not. She wanted to know if OK to do venipuncture - if OK with patient, that is only means of obtaining PT/INR for patient.

## 2014-07-07 ENCOUNTER — Ambulatory Visit (INDEPENDENT_AMBULATORY_CARE_PROVIDER_SITE_OTHER): Payer: PRIVATE HEALTH INSURANCE | Admitting: Pharmacist Clinician (PhC)/ Clinical Pharmacy Specialist

## 2014-07-07 ENCOUNTER — Telehealth: Payer: Self-pay | Admitting: Cardiovascular Disease

## 2014-07-07 NOTE — Telephone Encounter (Signed)
Spoke with pt, since Monday last week he has had an aching type pain off and on in the left side of his chest. It is not related to exertion, and he can feel it when he jerked to reach for something. His bp is 160/76 pulse 60, he is not in atrial fib by his report. He is belching a lot but has not paid attention if it helps with the aching. He took hydrocodone yesterday and it seemed to help. Reassurance given to patient, it does not sound heart related. He will call back with cont problems.

## 2014-07-07 NOTE — Telephone Encounter (Signed)
Pt says he has a hurting in his heart area,just a dull aching.

## 2014-08-11 LAB — PROTIME-INR: INR: 2.2 — AB (ref <=1.1)

## 2014-08-17 ENCOUNTER — Ambulatory Visit (INDEPENDENT_AMBULATORY_CARE_PROVIDER_SITE_OTHER): Payer: Self-pay | Admitting: Pharmacist Clinician (PhC)/ Clinical Pharmacy Specialist

## 2014-08-18 ENCOUNTER — Encounter: Payer: Self-pay | Admitting: Cardiovascular Disease

## 2014-09-14 ENCOUNTER — Other Ambulatory Visit: Payer: Self-pay | Admitting: Cardiovascular Disease

## 2014-09-14 LAB — PROTIME-INR
INR: 2.42 — ABNORMAL HIGH (ref <1.50)
PROTHROMBIN TIME: 26.3 s — AB (ref 11.6–15.2)

## 2014-09-15 ENCOUNTER — Ambulatory Visit (INDEPENDENT_AMBULATORY_CARE_PROVIDER_SITE_OTHER): Payer: Self-pay | Admitting: Pharmacist Clinician (PhC)/ Clinical Pharmacy Specialist

## 2014-09-15 ENCOUNTER — Telehealth: Payer: Self-pay | Admitting: Cardiology

## 2014-09-15 NOTE — Telephone Encounter (Signed)
Pt wanted to know what medication he could take for afib episode. According to MD 06-17-13 and 12-2013 appt notes pt can take extra dose of flecainide if needed for afib.

## 2014-09-16 NOTE — Telephone Encounter (Signed)
Will forward to Janan Halter to discuss with patient.

## 2014-09-17 NOTE — Telephone Encounter (Signed)
Per Dr Jackalyn Lombard note:  1. afib Well controlled with flecainide 50mg  BID If he has episodic afib then he can take an additional 50mg  of flecainide. We could increase flecainide to 100mg  BID if necessary. As he is doing well with medical therapy, we will avoid ablation presently though this would be an options if he fails medical therapy with flecainide. Continue anticoagulation  Spoke with patient and he is aware to take extra Flecainide if needed  He will call back if he feels he needs to follow up with fib clinic

## 2014-10-20 ENCOUNTER — Other Ambulatory Visit: Payer: Self-pay | Admitting: Cardiovascular Disease

## 2014-10-20 LAB — PROTIME-INR
INR: 2.16 — ABNORMAL HIGH (ref <1.50)
Prothrombin Time: 24.1 seconds — ABNORMAL HIGH (ref 11.6–15.2)

## 2014-10-21 ENCOUNTER — Ambulatory Visit (INDEPENDENT_AMBULATORY_CARE_PROVIDER_SITE_OTHER): Payer: Self-pay | Admitting: Pharmacist Clinician (PhC)/ Clinical Pharmacy Specialist

## 2014-10-21 DIAGNOSIS — Z7901 Long term (current) use of anticoagulants: Secondary | ICD-10-CM

## 2014-10-22 ENCOUNTER — Other Ambulatory Visit: Payer: Self-pay | Admitting: *Deleted

## 2014-10-22 ENCOUNTER — Other Ambulatory Visit: Payer: Self-pay | Admitting: Internal Medicine

## 2014-10-22 MED ORDER — METOPROLOL SUCCINATE ER 50 MG PO TB24: 50.0000 mg | ORAL_TABLET | Freq: Every evening | ORAL | Status: DC

## 2014-10-22 NOTE — Telephone Encounter (Signed)
Thompson Grayer, MD at 12/22/2013 10:37 AM   flecainide (TAMBOCOR) 50 MG tablet Take 1 tablet (50 mg total) by mouth 2 (two) times daily         Assessment and Plan:  1. afib Well controlled with flecainide 50mg  BID If he has episodic afib then he can take an additional 50mg  of flecainide. We could increase flecainide to 100mg  BID if necessary.

## 2014-11-17 ENCOUNTER — Other Ambulatory Visit: Payer: Self-pay | Admitting: Cardiovascular Disease

## 2014-11-17 LAB — PT WITH INR/FINGERSTICK
INR, fingerstick: 1.7 — ABNORMAL HIGH (ref 0.80–1.20)
PT FINGERSTICK: 20.9 s — AB (ref 10.4–12.5)

## 2014-11-17 LAB — POCT INR: INR: 1.7

## 2014-11-19 ENCOUNTER — Ambulatory Visit (INDEPENDENT_AMBULATORY_CARE_PROVIDER_SITE_OTHER): Payer: Self-pay | Admitting: Pharmacist Clinician (PhC)/ Clinical Pharmacy Specialist

## 2014-12-01 ENCOUNTER — Other Ambulatory Visit: Payer: Self-pay | Admitting: Cardiovascular Disease

## 2014-12-01 LAB — PT WITH INR/FINGERSTICK
INR, fingerstick: 2 — ABNORMAL HIGH (ref 0.80–1.20)
PT FINGERSTICK: 23.9 s — AB (ref 10.4–12.5)

## 2014-12-02 ENCOUNTER — Ambulatory Visit (INDEPENDENT_AMBULATORY_CARE_PROVIDER_SITE_OTHER): Payer: Self-pay | Admitting: Pharmacist Clinician (PhC)/ Clinical Pharmacy Specialist

## 2014-12-21 ENCOUNTER — Other Ambulatory Visit: Payer: Self-pay

## 2014-12-21 MED ORDER — METOPROLOL SUCCINATE ER 50 MG PO TB24: 50.0000 mg | ORAL_TABLET | Freq: Every day | ORAL | Status: DC

## 2014-12-21 NOTE — Telephone Encounter (Signed)
Rx(s) sent to pharmacy electronically.  

## 2014-12-24 ENCOUNTER — Other Ambulatory Visit: Payer: Self-pay | Admitting: Cardiovascular Disease

## 2014-12-24 ENCOUNTER — Ambulatory Visit (INDEPENDENT_AMBULATORY_CARE_PROVIDER_SITE_OTHER): Payer: Self-pay | Admitting: Pharmacist Clinician (PhC)/ Clinical Pharmacy Specialist

## 2014-12-24 LAB — PT WITH INR/FINGERSTICK
INR FINGERSTICK: 2 — AB (ref 0.80–1.20)
PT FINGERSTICK: 24.1 s — AB (ref 10.4–12.5)

## 2014-12-24 LAB — POCT INR: INR: 2

## 2015-01-12 ENCOUNTER — Other Ambulatory Visit: Payer: Self-pay | Admitting: *Deleted

## 2015-01-12 MED ORDER — METOPROLOL SUCCINATE ER 50 MG PO TB24: 50.0000 mg | ORAL_TABLET | Freq: Every day | ORAL | Status: DC

## 2015-01-19 ENCOUNTER — Other Ambulatory Visit: Payer: Self-pay | Admitting: Internal Medicine

## 2015-01-21 ENCOUNTER — Other Ambulatory Visit: Payer: Self-pay

## 2015-01-21 MED ORDER — FLECAINIDE ACETATE 50 MG PO TABS: 50.0000 mg | ORAL_TABLET | Freq: Two times a day (BID) | ORAL | Status: DC

## 2015-01-25 ENCOUNTER — Ambulatory Visit (INDEPENDENT_AMBULATORY_CARE_PROVIDER_SITE_OTHER): Payer: Self-pay | Admitting: Pharmacist Clinician (PhC)/ Clinical Pharmacy Specialist

## 2015-01-25 ENCOUNTER — Other Ambulatory Visit: Payer: Self-pay | Admitting: Cardiovascular Disease

## 2015-01-25 LAB — PROTIME-INR: INR: 2.8 — AB (ref <=1.1)

## 2015-01-25 LAB — PT WITH INR/FINGERSTICK
INR FINGERSTICK: 2.8 — AB (ref 0.80–1.20)
PT, fingerstick: 33.1 seconds — ABNORMAL HIGH (ref 10.4–12.5)

## 2015-02-05 ENCOUNTER — Telehealth: Payer: Self-pay | Admitting: Internal Medicine

## 2015-02-05 NOTE — Telephone Encounter (Signed)
Was started on medication on Wed at PCP due to BP being up  170(Losartan)  He went over to his PCP's office today and they told him if continued to fluctuate to be seen 182/84 and 163/84.  He is very confused about his medications and I have advised him to follow up with his PCP.  His medication list says he is on Amlodipine and he has it but has never taken it.  He does not know why.  I have explained to him the importance of being compliant with the mediations prescribed and asked he call his PCP on Mon to be seen and take all the medication bottles with him that he has.  He will do so and he say he is stopping the Losartan and restarting his Ramapril

## 2015-02-05 NOTE — Telephone Encounter (Signed)
New Prob   Pt c/o medication issue:  1. Name of Medication: Losartan-HCTZ  2. How are you currently taking this medication (dosage and times per day)? 100-25 mg once daily  3. Are you having a reaction (difficulty breathing--STAT)? None  4. What is your medication issue? Pt feels this medication is not working for him

## 2015-02-12 ENCOUNTER — Other Ambulatory Visit: Payer: Self-pay | Admitting: *Deleted

## 2015-02-12 MED ORDER — METOPROLOL SUCCINATE ER 50 MG PO TB24
50.0000 mg | ORAL_TABLET | Freq: Every day | ORAL | Status: DC
Start: 2015-02-12 — End: 2015-02-15

## 2015-02-15 ENCOUNTER — Telehealth: Payer: Self-pay | Admitting: Cardiovascular Disease

## 2015-02-15 MED ORDER — METOPROLOL SUCCINATE ER 50 MG PO TB24: 50.0000 mg | ORAL_TABLET | Freq: Every day | ORAL | Status: DC

## 2015-02-15 NOTE — Telephone Encounter (Signed)
°  1. Which medications need to be refilled?Metoprolol  2. Which pharmacy is medication to be sent to?[facility] Ph# [phone]  4. Do they need a 30 day or 90 day supply? 90  4. Would they like a call back once the medication has been sent to the pharmacy?no

## 2015-02-15 NOTE — Telephone Encounter (Signed)
Patient called in for refill of Metoprolol Sent through [facility] to [facility] through [employer]

## 2015-02-16 ENCOUNTER — Other Ambulatory Visit: Payer: Self-pay | Admitting: *Deleted

## 2015-02-16 MED ORDER — METOPROLOL SUCCINATE ER 50 MG PO TB24: 50.0000 mg | ORAL_TABLET | Freq: Every day | ORAL | Status: DC

## 2015-02-24 ENCOUNTER — Ambulatory Visit (INDEPENDENT_AMBULATORY_CARE_PROVIDER_SITE_OTHER): Payer: Self-pay | Admitting: Pharmacist Clinician (PhC)/ Clinical Pharmacy Specialist

## 2015-02-24 ENCOUNTER — Other Ambulatory Visit: Payer: Self-pay | Admitting: Cardiovascular Disease

## 2015-02-24 LAB — PROTIME-INR: INR: 2.2 — AB (ref <=1.1)

## 2015-02-24 LAB — PT WITH INR/FINGERSTICK
INR, fingerstick: 2.2 — ABNORMAL HIGH (ref 0.80–1.20)
PT FINGERSTICK: 26 s — AB (ref 10.4–12.5)

## 2015-03-18 ENCOUNTER — Other Ambulatory Visit: Payer: Self-pay | Admitting: Pharmacist Clinician (PhC)/ Clinical Pharmacy Specialist

## 2015-03-18 MED ORDER — WARFARIN SODIUM 5 MG PO TABS: ORAL_TABLET | ORAL | Status: DC

## 2015-03-25 ENCOUNTER — Other Ambulatory Visit: Payer: Self-pay | Admitting: *Deleted

## 2015-03-25 MED ORDER — METOPROLOL SUCCINATE ER 50 MG PO TB24: 50.0000 mg | ORAL_TABLET | Freq: Every day | ORAL | Status: DC

## 2015-03-26 ENCOUNTER — Telehealth: Payer: Self-pay | Admitting: Cardiovascular Disease

## 2015-03-26 ENCOUNTER — Other Ambulatory Visit: Payer: Self-pay | Admitting: Cardiovascular Disease

## 2015-03-26 MED ORDER — METOPROLOL SUCCINATE ER 50 MG PO TB24: 50.0000 mg | ORAL_TABLET | Freq: Every day | ORAL | Status: DC

## 2015-03-26 NOTE — Telephone Encounter (Signed)
Do not need this encounter,pt already received call.

## 2015-03-26 NOTE — Telephone Encounter (Signed)
°  1. Which medications need to be refilled? Metoprolol   2. Which pharmacy is medication to be sent to? [facility]   3. Do they need a 30 day or 90 day supply? 90  4. Would they like a call back once the medication has been sent to the pharmacy? Yes

## 2015-03-26 NOTE — Telephone Encounter (Signed)
Rx(s) sent to pharmacy electronically. Patient's wife notified.  

## 2015-03-30 ENCOUNTER — Other Ambulatory Visit: Payer: Self-pay | Admitting: Cardiovascular Disease

## 2015-03-30 LAB — PT WITH INR/FINGERSTICK
INR FINGERSTICK: 2.2 — AB (ref 0.80–1.20)
PT FINGERSTICK: 26.1 s — AB (ref 10.4–12.5)

## 2015-03-31 ENCOUNTER — Encounter: Payer: Self-pay | Admitting: Cardiovascular Disease

## 2015-03-31 ENCOUNTER — Ambulatory Visit (INDEPENDENT_AMBULATORY_CARE_PROVIDER_SITE_OTHER): Payer: Medicare Other | Admitting: Cardiovascular Disease

## 2015-03-31 ENCOUNTER — Ambulatory Visit (INDEPENDENT_AMBULATORY_CARE_PROVIDER_SITE_OTHER): Payer: Medicare Other | Admitting: Pharmacist Clinician (PhC)/ Clinical Pharmacy Specialist

## 2015-03-31 VITALS — BP 152/74 | HR 95 | Ht 75.0 in | Wt 225.0 lb

## 2015-03-31 DIAGNOSIS — I10 Essential (primary) hypertension: Secondary | ICD-10-CM

## 2015-03-31 DIAGNOSIS — I480 Paroxysmal atrial fibrillation: Secondary | ICD-10-CM | POA: Diagnosis not present

## 2015-03-31 NOTE — Progress Notes (Signed)
03/31/2015 Jason Alvarez   [birth year]  [id number]  Primary Physician Jason Alvarez., MD Primary Cardiologist: Jason Harp MD Jason Alvarez   HPI:  The patient is a very pleasant 74 year old mildly to moderately overweight married Caucasian male father of 2 who I last saw 09/22/13. He was initially sent to me for claudication. His risk factors include hypertension, non-insulin-requiring diabetes, and paroxysmal atrial fibrillation with nonsustained ventricular tachycardia on MCOT. He has mild sleep apnea without requiring CPAP. His LV function is normal by echo, and a Myoview was negative back in 2009. He is on Coumadin anticoagulation. He is otherwise asymptomatic. Since I last saw him, he relates increased frequency and duration of his A. Fib episodes. He is under a lot of stress at home recently because of his wife's cane to be in a nursing facility.He denies chest pain or shortness of breath. He saw Jason Alvarez at my request thought that his rhythm abnormality was atrial tachycardia.a Myoview stress test performed in November with nonischemic. Jason Alvarez did discuss the possibility of ablation which the patient declined.   Current Outpatient Prescriptions  Medication Sig Dispense Refill  . ALPRAZolam (XANAX) 1 MG tablet Take 1 mg by mouth 3 (three) times daily as needed. For anxiety    . flecainide (TAMBOCOR) 50 MG tablet Take 1 tablet (50 mg total) by mouth 2 (two) times daily. 180 tablet 0  . glipiZIDE-metformin (METAGLIP) 5-500 MG per tablet Take 1 tablet by mouth daily.     . hydrochlorothiazide (HYDRODIURIL) 25 MG tablet Take 25 mg by mouth daily.      Marland Kitchen HYDROcodone-acetaminophen (VICODIN) 5-500 MG per tablet Take 1 tablet by mouth every 8 (eight) hours as needed. For pain    . metoprolol succinate (TOPROL-XL) 50 MG 24 hr tablet Take 1 tablet (50 mg total) by mouth daily. MUST KEEP APPOINTMENT DR Jason Alvarez WITH DR 03/31/15 FOR FUTURE REFILLS OR 90-DAY SUPPLY 30 tablet  0  . nystatin cream (MYCOSTATIN)     . ramipril (ALTACE) 10 MG capsule Take 10 mg by mouth 2 (two) times daily.    Marland Kitchen warfarin (COUMADIN) 5 MG tablet Take 1 tablet by mouth daily or as directed by coumadin clinic 90 tablet 1   No current facility-administered medications for this visit.    Allergies  Allergen Reactions  . Penicillins Other (See Comments)    Childhood allergy    Social History   Social History  . Marital Status: Married    Spouse Name: N/A  . Number of Children: N/A  . Years of Education: N/A   Occupational History  . Not on file.   Social History Main Topics  . Smoking status: Never Smoker   . Smokeless tobacco: Never Used  . Alcohol Use: No  . Drug Use: No  . Sexual Activity: Not on file   Other Topics Concern  . Not on file   Social History Narrative     Review of Systems: General: negative for chills, fever, night sweats or weight changes.  Cardiovascular: negative for chest pain, dyspnea on exertion, edema, orthopnea, palpitations, paroxysmal nocturnal dyspnea or shortness of breath Dermatological: negative for rash Respiratory: negative for cough or wheezing Urologic: negative for hematuria Abdominal: negative for nausea, vomiting, diarrhea, bright red blood per rectum, melena, or hematemesis Neurologic: negative for visual changes, syncope, or dizziness All other systems reviewed and are otherwise negative except as noted above.    Blood pressure 152/74, pulse 95, height 6\' 3"  (1.905  m), weight 225 lb (102.059 kg).  General appearance: alert and no distress Neck: no adenopathy, no carotid bruit, no JVD, supple, symmetrical, trachea midline and thyroid not enlarged, symmetric, no tenderness/mass/nodules Lungs: clear to auscultation bilaterally Heart: regular rate and rhythm, S1, S2 normal, no murmur, click, rub or gallop Extremities: extremities normal, atraumatic, no cyanosis or edema Pulses: 2+ and symmetric Skin: Skin color, texture,  turgor normal. No rashes or lesions Neurologic: Grossly normal  EKG atrial fibrillation with a ventricular response of 95. Personal reviewed this EKG  ASSESSMENT AND PLAN:   Essential hypertension History of hypertension blood pressure measured at 152/74. He is on hydrochlorothiazide and metoprolol. Continue current meds at current dosing  Atrial fibrillation History of paroxysmal atrial fibrillation on Coumadin anticoagulation as well as flecainide. He is currently in A. Fib today with a controlled ventricular response. He is also under a lot of stress at home and has had more frequent episodes. He has seen Jason Alvarez as an outpatient who has broached the subject of A. Fib ablation however at this point I prefer pharmacologic therapy.      Jason Harp MD FACP,FACC,FAHA, [facility] 03/31/2015 9:48 AM

## 2015-03-31 NOTE — Assessment & Plan Note (Signed)
History of paroxysmal atrial fibrillation on Coumadin anticoagulation as well as flecainide. He is currently in A. Fib today with a controlled ventricular response. He is also under a lot of stress at home and has had more frequent episodes. He has seen Dr. Rayann Heman as an outpatient who has broached the subject of A. Fib ablation however at this point I prefer pharmacologic therapy.

## 2015-03-31 NOTE — Assessment & Plan Note (Signed)
History of hypertension blood pressure measured at 152/74. He is on hydrochlorothiazide and metoprolol. Continue current meds at current dosing

## 2015-03-31 NOTE — Patient Instructions (Signed)
Medication Instructions:  Your physician recommends that you continue on your current medications as directed. Please refer to the Current Medication list given to you today.   Labwork: I will get your lab work from your Primary Care Physician.   Testing/Procedures: None Today  Follow-Up: Your physician wants you to follow-up in: 12 months with Dr. Gwenlyn Found. You will receive a reminder letter in the mail two months in advance. If you don't receive a letter, please call our office to schedule the follow-up appointment.   Any Other Special Instructions Will Be Listed Below (If Applicable).

## 2015-04-21 ENCOUNTER — Telehealth: Payer: Self-pay | Admitting: Pharmacist Clinician (PhC)/ Clinical Pharmacy Specialist

## 2015-04-21 NOTE — Telephone Encounter (Signed)
Pt called with questions about new medications.  Returned call, pt now taking Invokana and lotrisone cream.  Assured patient that these are fine with his medications

## 2015-04-26 ENCOUNTER — Other Ambulatory Visit: Payer: Self-pay | Admitting: *Deleted

## 2015-04-26 ENCOUNTER — Other Ambulatory Visit: Payer: Self-pay | Admitting: Internal Medicine

## 2015-04-26 MED ORDER — METOPROLOL SUCCINATE ER 50 MG PO TB24: 50.0000 mg | ORAL_TABLET | Freq: Every day | ORAL | Status: DC

## 2015-04-28 ENCOUNTER — Other Ambulatory Visit: Payer: Self-pay | Admitting: Cardiovascular Disease

## 2015-04-28 ENCOUNTER — Ambulatory Visit (INDEPENDENT_AMBULATORY_CARE_PROVIDER_SITE_OTHER): Payer: Medicare Other | Admitting: Pharmacist

## 2015-04-28 DIAGNOSIS — Z7901 Long term (current) use of anticoagulants: Secondary | ICD-10-CM

## 2015-04-28 LAB — PT WITH INR/FINGERSTICK
INR FINGERSTICK: 4 — AB (ref 0.80–1.20)
PT FINGERSTICK: 48.2 s — AB (ref 10.4–12.5)

## 2015-04-28 LAB — PROTIME-INR: INR: 4 — AB (ref <=1.1)

## 2015-05-12 ENCOUNTER — Other Ambulatory Visit: Payer: Self-pay | Admitting: Cardiovascular Disease

## 2015-05-12 LAB — PT WITH INR/FINGERSTICK
INR, fingerstick: 2.9 — ABNORMAL HIGH (ref 0.80–1.20)
PT FINGERSTICK: 35.2 s — AB (ref 10.4–12.5)

## 2015-05-12 LAB — PROTIME-INR: INR: 2.9 — AB (ref <=1.1)

## 2015-05-13 ENCOUNTER — Ambulatory Visit (INDEPENDENT_AMBULATORY_CARE_PROVIDER_SITE_OTHER): Payer: Medicare Other | Admitting: Pharmacist Clinician (PhC)/ Clinical Pharmacy Specialist

## 2015-05-13 DIAGNOSIS — Z7901 Long term (current) use of anticoagulants: Secondary | ICD-10-CM

## 2015-05-19 ENCOUNTER — Encounter: Payer: Self-pay | Admitting: Cardiovascular Disease

## 2015-05-21 ENCOUNTER — Telehealth: Payer: Self-pay | Admitting: Internal Medicine

## 2015-05-21 NOTE — Telephone Encounter (Signed)
Pt called in. Had episode of A Fib yesterday. He states he knows when he goes into A Fib. States this happens usually every 1-2 weeks. Has happened a little more frequently w/in past few days. He also notes he has been sick w/ acute illness & on antibiotics.  He reported HR 85 w/ BP 112/84 today. Was lightheaded on standing yesterday but this is resolved.  I discussed w/ patient - he wants to stay at home, wanted advice on whether or not to take medications - I advised taking meds as indicated, if he was concerned about the BP being too low, OK to hold ramipril. Could take extra 1/2 tab metoprolol if BP value sufficient & having symptoms.  Discussed w/ [name], NP - she will contact pt to discuss A Fib Clinic appt for next week. I called pt back to relay this information. Also advised pt to call on-call provider line if concerns over weekend.  Pt voiced understanding of instructions.

## 2015-05-21 NOTE — Telephone Encounter (Signed)
Patient c/o Palpitations:  High priority if patient c/o lightheadedness and shortness of breath.  1. How long have you been having palpitations? Since yesterday pm 11/3  2. Are you currently experiencing lightheadedness and shortness of breath? Lightheaded- after getting up from seated position   3. Have you checked your BP and heart rate? (document readings) 11/4 @ 7:15 am- 112/84 p 85,, 11/3 @ 7:00pm-137/101 p 61  4. Are you experiencing any other symptoms? no

## 2015-05-24 ENCOUNTER — Encounter (HOSPITAL_COMMUNITY): Payer: Self-pay | Admitting: Nurse Practitioner

## 2015-05-24 ENCOUNTER — Ambulatory Visit (HOSPITAL_COMMUNITY)
Admission: RE | Admit: 2015-05-24 | Discharge: 2015-05-24 | Disposition: A | Discharge status: Home / Self Care | Payer: Medicare Other | Source: Ambulatory Visit | Attending: Nurse Practitioner | Admitting: Nurse Practitioner

## 2015-05-24 VITALS — BP 158/82 | HR 52 | Ht 75.0 in | Wt 211.6 lb

## 2015-05-24 DIAGNOSIS — I10 Essential (primary) hypertension: Secondary | ICD-10-CM | POA: Insufficient documentation

## 2015-05-24 DIAGNOSIS — I480 Paroxysmal atrial fibrillation: Secondary | ICD-10-CM | POA: Insufficient documentation

## 2015-05-24 MED ORDER — METOPROLOL SUCCINATE ER 50 MG PO TB24: ORAL_TABLET | ORAL | Status: DC

## 2015-05-24 MED ORDER — FLECAINIDE ACETATE 50 MG PO TABS: 75.0000 mg | ORAL_TABLET | Freq: Two times a day (BID) | ORAL | Status: DC

## 2015-05-24 MED ORDER — AMLODIPINE BESYLATE 5 MG PO TABS: 5.0000 mg | ORAL_TABLET | Freq: Every day | ORAL | Status: DC

## 2015-05-24 NOTE — Progress Notes (Signed)
Patient ID: Jason Alvarez, male   DOB: [birth year], 74 y.o.   MRN: [id number]     Primary Care Physician: Glo Herring., MD Referring Physician: Dr. Charlyne Alvarez is a 74 y.o. male with a h/o PAF that is being seen in the afib clinic today for complaints of increased afib burden for the last few weeks. He is on flecainide 50 mg daily. When he was seen by Dr. Rayann Alvarez in June, his BP was elevated and amlodipine 5 mg was added. His bp is elevated today and he says that he stopped taking amlodipine, unclear reasons. His HR is in low 50's. Occasionally he will feel dizzy. He will have afib episodes that last a couple of hours to 12-14 hours.  Today, he denies symptoms of palpitations, chest pain, shortness of breath, orthopnea, PND, lower extremity edema, dizziness, presyncope, syncope, or neurologic sequela. The patient is tolerating medications without difficulties and is otherwise without complaint today.   Past Medical History  Diagnosis Date  . Thrombocytopenia ([place])   . ITP (idiopathic thrombocytopenic purpura)   . HTN (hypertension)   . DM (diabetes mellitus) ([place])   . A-fib ([place])     paf with NSVT on MCOT, on coumadin, echo 01/31/08-nl lv, mild aortic valve sclerosis   . Chronic anticoagulation   . Aneurysm ([place])   . Anxiety   . Chronic ITP (idiopathic thrombocytopenia) ([facility]) 04/10/2012    Low grade thrombocytopenia consistent with chronic ITP not in need of therapy.   . Carotid bruit     doppler 05/17/12-mild plaque with no diameter reduction  . H/O cardiovascular stress test 01/31/08    no ishcemia, ef 61%  . Hypertension    Past Surgical History  Procedure Laterality Date  . Knee arthroscopy      right and left knee/1992 and 2000  . Vasectomy    . Circumcision    . Colonoscopy  06/25/2012    Procedure: COLONOSCOPY;  Surgeon: Jamesetta So, MD;  [facility];  Service: Gastroenterology;  Laterality: N/A;    Current Outpatient Prescriptions    Medication Sig Dispense Refill  . ALPRAZolam (XANAX) 1 MG tablet Take 1 mg by mouth 3 (three) times daily as needed. For anxiety    . flecainide (TAMBOCOR) 50 MG tablet Take 1.5 tablets (75 mg total) by mouth 2 (two) times daily. 90 tablet 3  . glipiZIDE-metformin (METAGLIP) 5-500 MG per tablet Take 1 tablet by mouth daily.     . hydrochlorothiazide (HYDRODIURIL) 25 MG tablet Take 25 mg by mouth daily.      Marland Kitchen HYDROcodone-acetaminophen (VICODIN) 5-500 MG per tablet Take 1 tablet by mouth every 8 (eight) hours as needed. For pain    . metoprolol succinate (TOPROL-XL) 50 MG 24 hr tablet Take 1/2 tablet (25mg ) once a day 90 tablet 3  . ramipril (ALTACE) 10 MG capsule Take 10 mg by mouth 2 (two) times daily.    Marland Kitchen warfarin (COUMADIN) 5 MG tablet Take 1 tablet by mouth daily or as directed by coumadin clinic 90 tablet 1  . amLODipine (NORVASC) 5 MG tablet Take 1 tablet (5 mg total) by mouth daily. 30 tablet 3  . nystatin cream (MYCOSTATIN)      No current facility-administered medications for this encounter.    Allergies  Allergen Reactions  . Penicillins Other (See Comments)    Childhood allergy    Social History   Social History  . Marital Status: Married    Spouse Name:  N/A  . Number of Children: N/A  . Years of Education: N/A   Occupational History  . Not on file.   Social History Main Topics  . Smoking status: Never Smoker   . Smokeless tobacco: Never Used  . Alcohol Use: No  . Drug Use: No  . Sexual Activity: Not on file   Other Topics Concern  . Not on file   Social History Narrative    Family History  Problem Relation Age of Onset  . Prostate cancer Father   . Cancer Sister     breast  . Cancer Brother     lung  . Melanoma Mother     ROS- All systems are reviewed and negative except as per the HPI above  Physical Exam: Filed Vitals:   05/24/15 0903  BP: 158/82  Pulse: 52  Height: 6\' 3"  (1.905 m)  Weight: 211 lb 9.6 oz (95.981 kg)    GEN- The  patient is well appearing, alert and oriented x 3 today.   Head- normocephalic, atraumatic Eyes-  Sclera clear, conjunctiva pink Ears- hearing intact Oropharynx- clear Neck- supple, no JVP Lymph- no cervical lymphadenopathy Lungs- Clear to ausculation bilaterally, normal work of breathing Heart- Regular rate and rhythm, no murmurs, rubs or gallops, PMI not laterally displaced GI- soft, NT, ND, + BS Extremities- no clubbing, cyanosis, or edema MS- no significant deformity or atrophy Skin- no rash or lesion Psych- euthymic mood, full affect Neuro- strength and sensation are intact  EKG- Sinus brady at 52 bpm, pr int 174 ms, qrs 104 ms, qtc 453 ms. Epic records reviewed.  Assessment and Plan: 1. PAF Increase flecainide to 75 mg bid. Reduce metoprolol to 25 mg a day for bradycardia  2. HTN Add back amlodipine 5 mg for elevated BP, which may be contributing to increase afib burden. Avoid salt.  F/u in afib clinic on one week.

## 2015-05-24 NOTE — Patient Instructions (Signed)
Your physician has recommended you make the following change in your medication:   1)Increase flecainide to 1 1/2 tablet (75mg )  twice a day   2)Decrease metoprolol to 1/2 tablet (25mg ) once a day  3)Amlodipine 5mg  once a day

## 2015-05-31 ENCOUNTER — Encounter (HOSPITAL_COMMUNITY): Payer: Self-pay | Admitting: Nurse Practitioner

## 2015-05-31 ENCOUNTER — Ambulatory Visit (HOSPITAL_COMMUNITY)
Admission: RE | Admit: 2015-05-31 | Discharge: 2015-05-31 | Disposition: A | Discharge status: Home / Self Care | Payer: Medicare Other | Source: Ambulatory Visit | Attending: Nurse Practitioner | Admitting: Nurse Practitioner

## 2015-05-31 VITALS — BP 200/88 | HR 52 | Ht 75.0 in | Wt 218.2 lb

## 2015-05-31 DIAGNOSIS — I480 Paroxysmal atrial fibrillation: Secondary | ICD-10-CM | POA: Insufficient documentation

## 2015-05-31 DIAGNOSIS — I10 Essential (primary) hypertension: Secondary | ICD-10-CM | POA: Insufficient documentation

## 2015-05-31 LAB — COMPREHENSIVE METABOLIC PANEL
ALK PHOS: 54 U/L (ref 38–126)
ALT: 17 U/L (ref 17–63)
ANION GAP: 6 (ref 5–15)
AST: 19 U/L (ref 15–41)
Albumin: 3.8 g/dL (ref 3.5–5.0)
BUN: 15 mg/dL (ref 6–20)
CALCIUM: 9.3 mg/dL (ref 8.9–10.3)
CHLORIDE: 103 mmol/L (ref 101–111)
CO2: 31 mmol/L (ref 22–32)
CREATININE: 0.86 mg/dL (ref 0.61–1.24)
Glucose, Bld: 210 mg/dL — ABNORMAL HIGH (ref 65–99)
Potassium: 4 mmol/L (ref 3.5–5.1)
Sodium: 140 mmol/L (ref 135–145)
Total Bilirubin: 1.1 mg/dL (ref 0.3–1.2)
Total Protein: 7.3 g/dL (ref 6.5–8.1)

## 2015-05-31 NOTE — Progress Notes (Addendum)
Patient ID: JENIL GAULDING, male   DOB: 11/01/1940, 74 y.o.   MRN: [id number]     Primary Care Physician: Glo Herring., MD Referring Physician: Dr. Charlyne Petrin is a 74 y.o. male with a h/o PAF, 11/7, seen in the afib clinic, for complaints of increased afib burden for the last few weeks. He is on flecainide 50 mg daily. When he was seen by Dr. Rayann Heman in June, his BP was elevated and amlodipine 5 mg was added. His bp is elevated today and he says that he stopped taking amlodipine, unclear reasons. His HR is in low 50's. Occasionally he will feel dizzy. He will have afib episodes that last a couple of hours to 12-14 hours. Flecainide was increased to 75 mg bid, amlodipine was added back and metoprolol was reduced for mild dizziness bradycardia at times.  He returns today not noticing any afib since  Flecainide dose increased. However, he did not take any of his meds prior to visit because he did not eat breakfast and BP is  elevated. At home, BP readings have been improved.   Today, he denies symptoms of palpitations, chest pain, shortness of breath, orthopnea, PND, lower extremity edema, dizziness, presyncope, syncope, or neurologic sequela. The patient is tolerating medications without difficulties and is otherwise without complaint today.   Past Medical History  Diagnosis Date  . Thrombocytopenia ([place])   . ITP (idiopathic thrombocytopenic purpura)   . HTN (hypertension)   . DM (diabetes mellitus) ([place])   . A-fib ([place])     paf with NSVT on MCOT, on coumadin, echo 01/31/08-nl lv, mild aortic valve sclerosis   . Chronic anticoagulation   . Aneurysm ([place])   . Anxiety   . Chronic ITP (idiopathic thrombocytopenia) ([facility]) 04/10/2012    Low grade thrombocytopenia consistent with chronic ITP not in need of therapy.   . Carotid bruit     doppler 05/17/12-mild plaque with no diameter reduction  . H/O cardiovascular stress test 01/31/08    no ishcemia, ef 61%  . Hypertension    Past  Surgical History  Procedure Laterality Date  . Knee arthroscopy      right and left knee/1992 and 2000  . Vasectomy    . Circumcision    . Colonoscopy  06/25/2012    Procedure: COLONOSCOPY;  Surgeon: Jamesetta So, MD;  [facility];  Service: Gastroenterology;  Laterality: N/A;    Current Outpatient Prescriptions  Medication Sig Dispense Refill  . ALPRAZolam (XANAX) 1 MG tablet Take 1 mg by mouth 3 (three) times daily as needed. For anxiety    . amLODipine (NORVASC) 5 MG tablet Take 1 tablet (5 mg total) by mouth daily. 30 tablet 3  . flecainide (TAMBOCOR) 50 MG tablet Take 1.5 tablets (75 mg total) by mouth 2 (two) times daily. 90 tablet 3  . glipiZIDE-metformin (METAGLIP) 5-500 MG per tablet Take 1 tablet by mouth daily.     . hydrochlorothiazide (HYDRODIURIL) 25 MG tablet Take 25 mg by mouth daily.      Marland Kitchen HYDROcodone-acetaminophen (VICODIN) 5-500 MG per tablet Take 1 tablet by mouth every 8 (eight) hours as needed. For pain    . metoprolol succinate (TOPROL-XL) 50 MG 24 hr tablet Take 1/2 tablet (25mg ) once a day 90 tablet 3  . nystatin cream (MYCOSTATIN)     . ramipril (ALTACE) 10 MG capsule Take 10 mg by mouth 2 (two) times daily.    Marland Kitchen warfarin (COUMADIN) 5 MG tablet  Take 1 tablet by mouth daily or as directed by coumadin clinic 90 tablet 1   No current facility-administered medications for this encounter.    Allergies  Allergen Reactions  . Penicillins Other (See Comments)    Childhood allergy    Social History   Social History  . Marital Status: Married    Spouse Name: N/A  . Number of Children: N/A  . Years of Education: N/A   Occupational History  . Not on file.   Social History Main Topics  . Smoking status: Never Smoker   . Smokeless tobacco: Never Used  . Alcohol Use: No  . Drug Use: No  . Sexual Activity: Not on file   Other Topics Concern  . Not on file   Social History Narrative    Family History  Problem Relation Age of Onset  .  Prostate cancer Father   . Cancer Sister     breast  . Cancer Brother     lung  . Melanoma Mother     ROS- All systems are reviewed and negative except as per the HPI above  Physical Exam: Filed Vitals:   05/31/15 0846  BP: 200/88  Pulse: 52  Height: 6\' 3"  (1.905 m)  Weight: 218 lb 3.2 oz (98.975 kg)    GEN- The patient is well appearing, alert and oriented x 3 today.   Head- normocephalic, atraumatic Eyes-  Sclera clear, conjunctiva pink Ears- hearing intact Oropharynx- clear Neck- supple, no JVP Lymph- no cervical lymphadenopathy Lungs- Clear to ausculation bilaterally, normal work of breathing Heart- Regular rate and rhythm, no murmurs, rubs or gallops, PMI not laterally displaced GI- soft, NT, ND, + BS Extremities- no clubbing, cyanosis, or edema MS- no significant deformity or atrophy Skin- no rash or lesion Psych- euthymic mood, full affect Neuro- strength and sensation are intact  EKG- Sinus brady at 52 bpm, pr int 176 ms, qrs 108 ms, qtc 444 ms. Epic records reviewed.  Assessment and Plan: 1. PAF Continue flecainide  75 mg bid. Continue metoprolol to 25 mg a day for bradycardia bmet today  2. HTN Continue amlodipine 5 mg for elevated BP, which may be contributing to increase afib burden. Avoid salt. Pt has bp meds with him and will take when he eats breakfast soon.  F/u in afib clinic one month.

## 2015-05-31 NOTE — Addendum Note (Signed)
Encounter addended by: Sherran Needs, NP on: 05/31/2015  9:56 AM<BR>     Documentation filed: Notes Section

## 2015-06-07 ENCOUNTER — Ambulatory Visit (INDEPENDENT_AMBULATORY_CARE_PROVIDER_SITE_OTHER): Payer: Medicare Other | Admitting: Pharmacist Clinician (PhC)/ Clinical Pharmacy Specialist

## 2015-06-07 ENCOUNTER — Other Ambulatory Visit: Payer: Self-pay | Admitting: Cardiovascular Disease

## 2015-06-07 DIAGNOSIS — Z7901 Long term (current) use of anticoagulants: Secondary | ICD-10-CM

## 2015-06-07 LAB — PT WITH INR/FINGERSTICK
INR FINGERSTICK: 1.5 — AB (ref 0.80–1.20)
PT, fingerstick: 18.5 seconds — ABNORMAL HIGH (ref 10.4–12.5)

## 2015-06-18 ENCOUNTER — Ambulatory Visit (INDEPENDENT_AMBULATORY_CARE_PROVIDER_SITE_OTHER): Payer: Medicare Other | Admitting: Urology

## 2015-06-18 DIAGNOSIS — R972 Elevated prostate specific antigen [PSA]: Secondary | ICD-10-CM | POA: Diagnosis not present

## 2015-06-18 DIAGNOSIS — R3915 Urgency of urination: Secondary | ICD-10-CM

## 2015-06-18 DIAGNOSIS — N401 Enlarged prostate with lower urinary tract symptoms: Secondary | ICD-10-CM

## 2015-07-02 ENCOUNTER — Ambulatory Visit (INDEPENDENT_AMBULATORY_CARE_PROVIDER_SITE_OTHER): Payer: Medicare Other | Admitting: Pharmacist Clinician (PhC)/ Clinical Pharmacy Specialist

## 2015-07-02 ENCOUNTER — Other Ambulatory Visit: Payer: Self-pay | Admitting: Cardiovascular Disease

## 2015-07-02 ENCOUNTER — Encounter (HOSPITAL_COMMUNITY): Payer: Self-pay | Admitting: Nurse Practitioner

## 2015-07-02 ENCOUNTER — Ambulatory Visit (HOSPITAL_COMMUNITY)
Admission: RE | Admit: 2015-07-02 | Discharge: 2015-07-02 | Disposition: A | Discharge status: Home / Self Care | Payer: Medicare Other | Source: Ambulatory Visit | Attending: Nurse Practitioner | Admitting: Nurse Practitioner

## 2015-07-02 VITALS — BP 154/70 | HR 61 | Ht 75.0 in | Wt 221.6 lb

## 2015-07-02 DIAGNOSIS — I480 Paroxysmal atrial fibrillation: Secondary | ICD-10-CM | POA: Insufficient documentation

## 2015-07-02 DIAGNOSIS — I10 Essential (primary) hypertension: Secondary | ICD-10-CM | POA: Insufficient documentation

## 2015-07-02 DIAGNOSIS — Z7901 Long term (current) use of anticoagulants: Secondary | ICD-10-CM

## 2015-07-02 LAB — PT WITH INR/FINGERSTICK
INR, fingerstick: 1.9 — ABNORMAL HIGH (ref 0.80–1.20)
PT FINGERSTICK: 22.5 s — AB (ref 10.4–12.5)

## 2015-07-02 NOTE — Progress Notes (Signed)
Patient ID: Jason Alvarez, male   DOB: Sep 29, 1940, 74 y.o.   MRN: [id number]     Primary Care Physician: Glo Herring., MD Referring Physician:  Dr. Charlyne Petrin is a 74 y.o. male with a h/o afib on flecainide for f/u in the afib clinic. He is back on amlodipine 5 mg for elevated BP and today despite BP being 154/74, when rechecked by me, his BP reading at home refllect BP's mostly in 0000000 systolic range. He feels well today and does not report any afib burden. Continues on flecainide/warfarin, with last INR 2.9.   Today, he denies symptoms of palpitations, chest pain, shortness of breath, orthopnea, PND, lower extremity edema, dizziness, presyncope, syncope, or neurologic sequela. The patient is tolerating medications without difficulties and is otherwise without complaint today.   Past Medical History  Diagnosis Date  . Thrombocytopenia ([place])   . ITP (idiopathic thrombocytopenic purpura)   . HTN (hypertension)   . DM (diabetes mellitus) ([place])   . A-fib ([place])     paf with NSVT on MCOT, on coumadin, echo 01/31/08-nl lv, mild aortic valve sclerosis   . Chronic anticoagulation   . Aneurysm ([place])   . Anxiety   . Chronic ITP (idiopathic thrombocytopenia) ([facility]) 04/10/2012    Low grade thrombocytopenia consistent with chronic ITP not in need of therapy.   . Carotid bruit     doppler 05/17/12-mild plaque with no diameter reduction  . H/O cardiovascular stress test 01/31/08    no ishcemia, ef 61%  . Hypertension    Past Surgical History  Procedure Laterality Date  . Knee arthroscopy      right and left knee/1992 and 2000  . Vasectomy    . Circumcision    . Colonoscopy  06/25/2012    Procedure: COLONOSCOPY;  Surgeon: Jamesetta So, MD;  [facility];  Service: Gastroenterology;  Laterality: N/A;    Current Outpatient Prescriptions  Medication Sig Dispense Refill  . ALPRAZolam (XANAX) 1 MG tablet Take 1 mg by mouth 3 (three) times daily as needed. For  anxiety    . amLODipine (NORVASC) 5 MG tablet Take 1 tablet (5 mg total) by mouth daily. 30 tablet 3  . flecainide (TAMBOCOR) 50 MG tablet Take 1.5 tablets (75 mg total) by mouth 2 (two) times daily. 90 tablet 3  . glipiZIDE-metformin (METAGLIP) 5-500 MG per tablet Take 1 tablet by mouth daily.     . hydrochlorothiazide (HYDRODIURIL) 25 MG tablet Take 25 mg by mouth daily.      Marland Kitchen HYDROcodone-acetaminophen (VICODIN) 5-500 MG per tablet Take 1 tablet by mouth every 8 (eight) hours as needed. For pain    . metoprolol succinate (TOPROL-XL) 50 MG 24 hr tablet Take 1/2 tablet (25mg ) once a day 90 tablet 3  . nystatin cream (MYCOSTATIN)     . ramipril (ALTACE) 10 MG capsule Take 10 mg by mouth 2 (two) times daily.    Marland Kitchen warfarin (COUMADIN) 5 MG tablet Take 1 tablet by mouth daily or as directed by coumadin clinic 90 tablet 1   No current facility-administered medications for this encounter.    Allergies  Allergen Reactions  . Penicillins Other (See Comments)    Childhood allergy    Social History   Social History  . Marital Status: Married    Spouse Name: N/A  . Number of Children: N/A  . Years of Education: N/A   Occupational History  . Not on file.   Social History Main  Topics  . Smoking status: Never Smoker   . Smokeless tobacco: Never Used  . Alcohol Use: No  . Drug Use: No  . Sexual Activity: Not on file   Other Topics Concern  . Not on file   Social History Narrative    Family History  Problem Relation Age of Onset  . Prostate cancer Father   . Cancer Sister     breast  . Cancer Brother     lung  . Melanoma Mother     ROS- All systems are reviewed and negative except as per the HPI above  Physical Exam: Filed Vitals:   07/02/15 0845 07/02/15 0901  BP: 160/78 154/70  Pulse: 61   Height: 6\' 3"  (1.905 m)   Weight: 221 lb 9.6 oz (100.517 kg)     GEN- The patient is well appearing, alert and oriented x 3 today.   Head- normocephalic, atraumatic Eyes-   Sclera clear, conjunctiva pink Ears- hearing intact Oropharynx- clear Neck- supple, no JVP Lymph- no cervical lymphadenopathy Lungs- Clear to ausculation bilaterally, normal work of breathing Heart- Regular rate and rhythm, no murmurs, rubs or gallops, PMI not laterally displaced GI- soft, NT, ND, + BS Extremities- no clubbing, cyanosis, or edema MS- no significant deformity or atrophy Skin- no rash or lesion Psych- euthymic mood, full affect Neuro- strength and sensation are intact  EKG-SR at 61 bpm, PR int 218 ms, QRS int 110 ms, QTc 446 ms Epic records reviewed  Assessment and Plan: 1. PAF Maintaining SR on flecainide/metoprolol Continue warfarin for chadsvasc score of at least 3  2. HTN Better controlled Continue amlodipine, ramipril, HCTZ  F/u afib clinic in 3 months  [name], [facility] [address] [place], [place] [address] [phone]

## 2015-07-05 ENCOUNTER — Telehealth: Payer: Self-pay | Admitting: Cardiovascular Disease

## 2015-07-05 NOTE — Telephone Encounter (Signed)
Spoke with patient, advised to take 1.5 tablets today, as probably missed yesterday dose.  Pt voiced understanding

## 2015-07-05 NOTE — Telephone Encounter (Signed)
Per The Answering Service:Pt have questions about his medicine.

## 2015-07-05 NOTE — Telephone Encounter (Signed)
Called and spoke to Mr. Jason Alvarez. He reports he had PT INR drawn Friday, it was 1.9 and he was advised to take 1.5 tablets of his warfarin then resume 1 tablet daily.  He reports that he suspects he missed last night's dose (he takes his warfarin in evening) and is asking for instruction.  Informed pt I would send to [facility] for advice, and she would call and notify him if extra dose recommended.  For now, should take 1 tablet this evening unless otherwise instructed. Pt voiced understanding.

## 2015-08-04 ENCOUNTER — Ambulatory Visit (INDEPENDENT_AMBULATORY_CARE_PROVIDER_SITE_OTHER): Payer: Medicare Other | Admitting: Pharmacist Clinician (PhC)/ Clinical Pharmacy Specialist

## 2015-08-04 ENCOUNTER — Other Ambulatory Visit: Payer: Self-pay | Admitting: Cardiovascular Disease

## 2015-08-04 DIAGNOSIS — I4891 Unspecified atrial fibrillation: Secondary | ICD-10-CM | POA: Diagnosis not present

## 2015-08-04 DIAGNOSIS — Z7901 Long term (current) use of anticoagulants: Secondary | ICD-10-CM | POA: Diagnosis not present

## 2015-08-04 LAB — PT WITH INR/FINGERSTICK
INR, fingerstick: 1.7 — ABNORMAL HIGH (ref 0.80–1.20)
PT, fingerstick: 20 seconds — ABNORMAL HIGH (ref 10.4–12.5)

## 2015-08-15 ENCOUNTER — Telehealth: Payer: Self-pay | Admitting: Cardiology

## 2015-08-15 NOTE — Telephone Encounter (Signed)
  Patient with h/o PAF on Flecainide, metoprolol and coumadin. He notes that he is back in afib. Started earlier last night. Only symptom is irregular heart beat/ palpitations. No CP, dyspnea, dizziness, syncope/ near syncope. Her is conversing normal and does not sound dyspneic. He has been fully complaint with meds. HR is currently 102 bpm and BP is 162/122. He usually takes 50 mg of Toprol XL at night. Patient instructed to take dose now and to call back later or go to ED if condition worsens. I will send staff message to try to get him in to see Dr. Rayann Heman or with Afib clinic later this week.   Kjuan Seipp 08/15/2015

## 2015-08-16 ENCOUNTER — Telehealth (HOSPITAL_COMMUNITY): Payer: Self-pay | Admitting: *Deleted

## 2015-08-16 ENCOUNTER — Other Ambulatory Visit: Payer: Self-pay | Admitting: Cardiovascular Disease

## 2015-08-16 NOTE — Telephone Encounter (Signed)
Pt called in this morning wanting to know what dose of metoprolol he should take today. He was told over weekend to take whole tablet while he was in afib. Today reports being back in NSR - HR 62 BP 165/85 (no meds today). Instructed patient to take normal dosing of metoprolol (25mg ). To call back if further issues/questions.

## 2015-08-17 ENCOUNTER — Other Ambulatory Visit: Payer: Self-pay | Admitting: Cardiovascular Disease

## 2015-08-17 ENCOUNTER — Ambulatory Visit (INDEPENDENT_AMBULATORY_CARE_PROVIDER_SITE_OTHER): Payer: Medicare Other | Admitting: Pharmacist Clinician (PhC)/ Clinical Pharmacy Specialist

## 2015-08-17 DIAGNOSIS — Z7901 Long term (current) use of anticoagulants: Secondary | ICD-10-CM

## 2015-08-17 DIAGNOSIS — I4891 Unspecified atrial fibrillation: Secondary | ICD-10-CM | POA: Diagnosis not present

## 2015-08-17 LAB — PROTIME-INR: INR: 1.6 — AB (ref <=1.1)

## 2015-08-17 LAB — PT WITH INR/FINGERSTICK
INR, fingerstick: 1.6 — ABNORMAL HIGH (ref 0.80–1.20)
PT FINGERSTICK: 19.8 s — AB (ref 10.4–12.5)

## 2015-08-27 DIAGNOSIS — N400 Enlarged prostate without lower urinary tract symptoms: Secondary | ICD-10-CM | POA: Diagnosis not present

## 2015-08-27 DIAGNOSIS — I10 Essential (primary) hypertension: Secondary | ICD-10-CM | POA: Diagnosis not present

## 2015-08-27 DIAGNOSIS — E119 Type 2 diabetes mellitus without complications: Secondary | ICD-10-CM | POA: Diagnosis not present

## 2015-08-27 DIAGNOSIS — Z6827 Body mass index (BMI) 27.0-27.9, adult: Secondary | ICD-10-CM | POA: Diagnosis not present

## 2015-08-27 DIAGNOSIS — K219 Gastro-esophageal reflux disease without esophagitis: Secondary | ICD-10-CM | POA: Diagnosis not present

## 2015-08-27 DIAGNOSIS — Z1389 Encounter for screening for other disorder: Secondary | ICD-10-CM | POA: Diagnosis not present

## 2015-08-27 DIAGNOSIS — I4891 Unspecified atrial fibrillation: Secondary | ICD-10-CM | POA: Diagnosis not present

## 2015-08-30 ENCOUNTER — Other Ambulatory Visit: Payer: Self-pay | Admitting: Cardiovascular Disease

## 2015-08-30 DIAGNOSIS — Z7901 Long term (current) use of anticoagulants: Secondary | ICD-10-CM | POA: Diagnosis not present

## 2015-08-30 DIAGNOSIS — I4891 Unspecified atrial fibrillation: Secondary | ICD-10-CM | POA: Diagnosis not present

## 2015-08-30 LAB — PT WITH INR/FINGERSTICK
INR, fingerstick: 2 — ABNORMAL HIGH (ref 0.80–1.20)
PT, fingerstick: 24.1 seconds — ABNORMAL HIGH (ref 10.4–12.5)

## 2015-08-30 LAB — PROTIME-INR: INR: 2 — AB (ref <=1.1)

## 2015-08-31 ENCOUNTER — Ambulatory Visit (INDEPENDENT_AMBULATORY_CARE_PROVIDER_SITE_OTHER): Payer: Medicare Other | Admitting: Pharmacist Clinician (PhC)/ Clinical Pharmacy Specialist

## 2015-08-31 DIAGNOSIS — Z7901 Long term (current) use of anticoagulants: Secondary | ICD-10-CM

## 2015-09-07 DIAGNOSIS — R69 Illness, unspecified: Secondary | ICD-10-CM | POA: Diagnosis not present

## 2015-09-20 ENCOUNTER — Other Ambulatory Visit: Payer: Self-pay | Admitting: Cardiovascular Disease

## 2015-09-20 ENCOUNTER — Ambulatory Visit (INDEPENDENT_AMBULATORY_CARE_PROVIDER_SITE_OTHER): Payer: Medicare Other | Admitting: Pharmacist Clinician (PhC)/ Clinical Pharmacy Specialist

## 2015-09-20 DIAGNOSIS — Z7901 Long term (current) use of anticoagulants: Secondary | ICD-10-CM | POA: Diagnosis not present

## 2015-09-20 DIAGNOSIS — I4891 Unspecified atrial fibrillation: Secondary | ICD-10-CM | POA: Diagnosis not present

## 2015-09-20 LAB — PT WITH INR/FINGERSTICK
INR, fingerstick: 2.4 — ABNORMAL HIGH (ref 0.80–1.20)
PT, fingerstick: 28.9 seconds — ABNORMAL HIGH (ref 10.4–12.5)

## 2015-09-23 ENCOUNTER — Other Ambulatory Visit (HOSPITAL_COMMUNITY): Payer: Self-pay | Admitting: *Deleted

## 2015-09-23 MED ORDER — FLECAINIDE ACETATE 50 MG PO TABS: 50.0000 mg | ORAL_TABLET | Freq: Three times a day (TID) | ORAL | Status: DC

## 2015-09-23 NOTE — Telephone Encounter (Signed)
Pt having difficulty cutting flecainide in half - it crumbles majority of the time due to not being scored. Discussed with Dr. Rayann Heman and will take 1 tab (50mg ) every 8 hours. Patient verbalized understanding. RX sent in per pt request.

## 2015-09-27 ENCOUNTER — Ambulatory Visit (HOSPITAL_COMMUNITY)
Admission: RE | Admit: 2015-09-27 | Discharge: 2015-09-27 | Disposition: A | Discharge status: Home / Self Care | Payer: Medicare HMO | Source: Ambulatory Visit | Attending: Nurse Practitioner | Admitting: Nurse Practitioner

## 2015-09-27 ENCOUNTER — Encounter (HOSPITAL_COMMUNITY): Payer: Self-pay | Admitting: Nurse Practitioner

## 2015-09-27 VITALS — BP 150/78 | HR 61 | Ht 75.0 in | Wt 220.2 lb

## 2015-09-27 DIAGNOSIS — Z803 Family history of malignant neoplasm of breast: Secondary | ICD-10-CM | POA: Diagnosis not present

## 2015-09-27 DIAGNOSIS — Z8042 Family history of malignant neoplasm of prostate: Secondary | ICD-10-CM | POA: Diagnosis not present

## 2015-09-27 DIAGNOSIS — Z808 Family history of malignant neoplasm of other organs or systems: Secondary | ICD-10-CM | POA: Insufficient documentation

## 2015-09-27 DIAGNOSIS — Z7984 Long term (current) use of oral hypoglycemic drugs: Secondary | ICD-10-CM | POA: Diagnosis not present

## 2015-09-27 DIAGNOSIS — R69 Illness, unspecified: Secondary | ICD-10-CM | POA: Diagnosis not present

## 2015-09-27 DIAGNOSIS — Z801 Family history of malignant neoplasm of trachea, bronchus and lung: Secondary | ICD-10-CM | POA: Insufficient documentation

## 2015-09-27 DIAGNOSIS — D693 Immune thrombocytopenic purpura: Secondary | ICD-10-CM | POA: Insufficient documentation

## 2015-09-27 DIAGNOSIS — E119 Type 2 diabetes mellitus without complications: Secondary | ICD-10-CM | POA: Insufficient documentation

## 2015-09-27 DIAGNOSIS — Z880 Allergy status to penicillin: Secondary | ICD-10-CM | POA: Insufficient documentation

## 2015-09-27 DIAGNOSIS — F419 Anxiety disorder, unspecified: Secondary | ICD-10-CM | POA: Insufficient documentation

## 2015-09-27 DIAGNOSIS — Z7901 Long term (current) use of anticoagulants: Secondary | ICD-10-CM | POA: Insufficient documentation

## 2015-09-27 DIAGNOSIS — Z79899 Other long term (current) drug therapy: Secondary | ICD-10-CM | POA: Insufficient documentation

## 2015-09-27 DIAGNOSIS — I4891 Unspecified atrial fibrillation: Secondary | ICD-10-CM | POA: Diagnosis present

## 2015-09-27 DIAGNOSIS — I10 Essential (primary) hypertension: Secondary | ICD-10-CM | POA: Diagnosis not present

## 2015-09-27 DIAGNOSIS — I480 Paroxysmal atrial fibrillation: Secondary | ICD-10-CM | POA: Diagnosis not present

## 2015-09-27 MED ORDER — FLECAINIDE ACETATE 50 MG PO TABS: 75.0000 mg | ORAL_TABLET | Freq: Two times a day (BID) | ORAL | Status: DC

## 2015-09-27 NOTE — Patient Instructions (Signed)
Go back to taking Flecainide 75mg  (1 1/2 tablets)  twice a day

## 2015-09-27 NOTE — Progress Notes (Signed)
Patient ID: Jason Alvarez, male   DOB: [birth year], 75 y.o.   MRN: [id number]     Primary Care Physician: Glo Herring., MD Referring Physician: Dr. Charlyne Petrin is a 75 y.o. male with a h/o PAF on flecainide. Overall, he may have one or two episodes a month. He c/o to pharmacist re his flecainide sometimes crumbling when he would cut the pill for 75 mg bid and the pharmacist suggested to take 50 mg q 8 hours. The pt states that this is very inconvenient  for him and he prefers to take 75 mg bid. He will go back to this dose today. He continues on warfarin without bleeding issues. Has his physical tomorrow with expected lab work.  Today, he denies symptoms of palpitations, chest pain, shortness of breath, orthopnea, PND, lower extremity edema, dizziness, presyncope, syncope, or neurologic sequela. The patient is tolerating medications without difficulties and is otherwise without complaint today.   Past Medical History  Diagnosis Date  . Thrombocytopenia ([place])   . ITP (idiopathic thrombocytopenic purpura)   . HTN (hypertension)   . DM (diabetes mellitus) ([place])   . A-fib ([place])     paf with NSVT on MCOT, on coumadin, echo 01/31/08-nl lv, mild aortic valve sclerosis   . Chronic anticoagulation   . Aneurysm ([place])   . Anxiety   . Chronic ITP (idiopathic thrombocytopenia) ([facility]) 04/10/2012    Low grade thrombocytopenia consistent with chronic ITP not in need of therapy.   . Carotid bruit     doppler 05/17/12-mild plaque with no diameter reduction  . H/O cardiovascular stress test 01/31/08    no ishcemia, ef 61%  . Hypertension    Past Surgical History  Procedure Laterality Date  . Knee arthroscopy      right and left knee/1992 and 2000  . Vasectomy    . Circumcision    . Colonoscopy  06/25/2012    Procedure: COLONOSCOPY;  Surgeon: Jamesetta So, MD;  [facility];  Service: Gastroenterology;  Laterality: N/A;    Current Outpatient Prescriptions  Medication  Sig Dispense Refill  . ALPRAZolam (XANAX) 1 MG tablet Take 1 mg by mouth 3 (three) times daily as needed. For anxiety    . amLODipine (NORVASC) 5 MG tablet Take 1 tablet (5 mg total) by mouth daily. 30 tablet 3  . flecainide (TAMBOCOR) 50 MG tablet Take 1.5 tablets (75 mg total) by mouth 2 (two) times daily. 90 tablet 6  . glipiZIDE-metformin (METAGLIP) 5-500 MG per tablet Take 1 tablet by mouth daily.     . hydrochlorothiazide (HYDRODIURIL) 25 MG tablet Take 25 mg by mouth daily.      Marland Kitchen HYDROcodone-acetaminophen (VICODIN) 5-500 MG per tablet Take 1 tablet by mouth every 8 (eight) hours as needed. For pain    . metoprolol succinate (TOPROL-XL) 50 MG 24 hr tablet Take 1/2 tablet (25mg ) once a day 90 tablet 3  . nystatin cream (MYCOSTATIN)     . ramipril (ALTACE) 10 MG capsule Take 10 mg by mouth 2 (two) times daily.    Marland Kitchen warfarin (COUMADIN) 5 MG tablet Take 1-1.5 tablets by mouth daily as directed by coumadin clinic 120 tablet 1   No current facility-administered medications for this encounter.    Allergies  Allergen Reactions  . Penicillins Other (See Comments)    Childhood allergy    Social History   Social History  . Marital Status: Married    Spouse Name: N/A  . Number  of Children: N/A  . Years of Education: N/A   Occupational History  . Not on file.   Social History Main Topics  . Smoking status: Never Smoker   . Smokeless tobacco: Never Used  . Alcohol Use: No  . Drug Use: No  . Sexual Activity: Not on file   Other Topics Concern  . Not on file   Social History Narrative    Family History  Problem Relation Age of Onset  . Prostate cancer Father   . Cancer Sister     breast  . Cancer Brother     lung  . Melanoma Mother     ROS- All systems are reviewed and negative except as per the HPI above  Physical Exam: Filed Vitals:   09/27/15 0839 09/27/15 0907  BP: 170/78 150/78  Pulse: 61   Height: 6\' 3"  (1.905 m)   Weight: 220 lb 3.2 oz (99.882 kg)      GEN- The patient is well appearing, alert and oriented x 3 today.   Head- normocephalic, atraumatic Eyes-  Sclera clear, conjunctiva pink Ears- hearing intact Oropharynx- clear Neck- supple, no JVP Lymph- no cervical lymphadenopathy Lungs- Clear to ausculation bilaterally, normal work of breathing Heart- Regular rate and rhythm, no murmurs, rubs or gallops, PMI not laterally displaced GI- soft, NT, ND, + BS Extremities- no clubbing, cyanosis, or edema MS- no significant deformity or atrophy Skin- no rash or lesion Psych- euthymic mood, full affect Neuro- strength and sensation are intact  EKG- SR at 61 bpm, qrs int 102 ms, qtc 459 ms Epic records reviewed  Assessment and Plan: 1. PAF Controlled on flecainide  He will go back to 75 mg bid Continue metoprolol Continue warfarin  2.HTN Rechecked at 150/78 States his bp usually runs at that level at home Continue amlodipine, ramipril, hctz  2. Chadsvasc score of at least 4 Continue warfarin  F/u in 3 months  Butch Penny C. Darlin Stenseth, [facility] [address] [place], [place] [address] [phone]

## 2015-09-28 DIAGNOSIS — K219 Gastro-esophageal reflux disease without esophagitis: Secondary | ICD-10-CM | POA: Diagnosis not present

## 2015-09-28 DIAGNOSIS — Z6827 Body mass index (BMI) 27.0-27.9, adult: Secondary | ICD-10-CM | POA: Diagnosis not present

## 2015-09-28 DIAGNOSIS — E663 Overweight: Secondary | ICD-10-CM | POA: Diagnosis not present

## 2015-09-28 DIAGNOSIS — I10 Essential (primary) hypertension: Secondary | ICD-10-CM | POA: Diagnosis not present

## 2015-09-28 DIAGNOSIS — Z1389 Encounter for screening for other disorder: Secondary | ICD-10-CM | POA: Diagnosis not present

## 2015-09-28 DIAGNOSIS — Z79899 Other long term (current) drug therapy: Secondary | ICD-10-CM | POA: Diagnosis not present

## 2015-09-28 DIAGNOSIS — N400 Enlarged prostate without lower urinary tract symptoms: Secondary | ICD-10-CM | POA: Diagnosis not present

## 2015-09-28 DIAGNOSIS — Z0000 Encounter for general adult medical examination without abnormal findings: Secondary | ICD-10-CM | POA: Diagnosis not present

## 2015-10-01 ENCOUNTER — Ambulatory Visit (HOSPITAL_COMMUNITY): Payer: Medicare Other | Admitting: Nurse Practitioner

## 2015-10-07 ENCOUNTER — Telehealth: Payer: Self-pay | Admitting: *Deleted

## 2015-10-07 ENCOUNTER — Telehealth: Payer: Self-pay | Admitting: Vascular Surgery

## 2015-10-07 NOTE — Telephone Encounter (Signed)
Per Davy Pique- additional #'s [phone]. Please call patient

## 2015-10-07 NOTE — Telephone Encounter (Signed)
Mr Brouse called in earlier, and somehow was transferred to the vein center voicemail. Kathlee Nations sent a staff message stating that he is CE[facility]'s patient and has leg swelling.  I had forwarded her staff message to clinical pool for triage, however, I think there was some confusion on why it was sent and it was deleted.  Can someone please call Mr Pendelton to triage his symptoms and advise?  Sorry for any confusion I may have caused!!  Hinton Dyer

## 2015-10-07 NOTE — Telephone Encounter (Signed)
Mr. Grech states he would like an appointment to have his legs evaluated.  He saw Mr. Fields in 11-2013 and Dr. Kellie Simmering in 2013.  Mr. Fries states he has varicose veins and is experiencing bilateral leg pain L>R from the knee down to his ankles that he describes as a dull ache.  He also c/o right leg swelling but denies foot or ankle swelling. Mr. Yoffe states he would like an early morning appointment since he lives in [place].  Will have scheduler call him to set up bilateral venous reflux study and appointment with Dr. Kellie Simmering.

## 2015-10-08 ENCOUNTER — Telehealth: Payer: Self-pay | Admitting: Vascular Surgery

## 2015-10-08 NOTE — Telephone Encounter (Signed)
notified patient's wife of appt. on 12-07-15 at 9am for lab and then 10:00 to see dr. Kellie Simmering   bar

## 2015-10-18 ENCOUNTER — Other Ambulatory Visit: Payer: Self-pay | Admitting: Cardiovascular Disease

## 2015-10-18 ENCOUNTER — Ambulatory Visit (INDEPENDENT_AMBULATORY_CARE_PROVIDER_SITE_OTHER): Payer: Medicare Other | Admitting: Pharmacist Clinician (PhC)/ Clinical Pharmacy Specialist

## 2015-10-18 DIAGNOSIS — Z7901 Long term (current) use of anticoagulants: Secondary | ICD-10-CM

## 2015-10-18 DIAGNOSIS — I4891 Unspecified atrial fibrillation: Secondary | ICD-10-CM | POA: Diagnosis not present

## 2015-10-18 LAB — POCT INR: INR: 2.1

## 2015-10-18 LAB — PT WITH INR/FINGERSTICK
INR, fingerstick: 2.1 — ABNORMAL HIGH (ref 0.80–1.20)
PT FINGERSTICK: 24.8 s — AB (ref 10.4–12.5)

## 2015-11-10 DIAGNOSIS — R69 Illness, unspecified: Secondary | ICD-10-CM | POA: Diagnosis not present

## 2015-11-15 ENCOUNTER — Ambulatory Visit (INDEPENDENT_AMBULATORY_CARE_PROVIDER_SITE_OTHER): Payer: Medicare Other | Admitting: Pharmacist

## 2015-11-15 ENCOUNTER — Other Ambulatory Visit: Payer: Self-pay | Admitting: Cardiovascular Disease

## 2015-11-15 DIAGNOSIS — Z7901 Long term (current) use of anticoagulants: Secondary | ICD-10-CM

## 2015-11-15 DIAGNOSIS — I4891 Unspecified atrial fibrillation: Secondary | ICD-10-CM | POA: Diagnosis not present

## 2015-11-15 LAB — POCT INR: INR: 2.8

## 2015-11-15 LAB — PT WITH INR/FINGERSTICK
INR, fingerstick: 2.8 — ABNORMAL HIGH (ref 0.80–1.20)
PT, fingerstick: 33.1 seconds — ABNORMAL HIGH (ref 10.4–12.5)

## 2015-11-30 ENCOUNTER — Encounter: Payer: Self-pay | Admitting: Vascular Surgery

## 2015-12-01 DIAGNOSIS — I4891 Unspecified atrial fibrillation: Secondary | ICD-10-CM | POA: Diagnosis not present

## 2015-12-01 DIAGNOSIS — E119 Type 2 diabetes mellitus without complications: Secondary | ICD-10-CM | POA: Diagnosis not present

## 2015-12-01 DIAGNOSIS — Z1389 Encounter for screening for other disorder: Secondary | ICD-10-CM | POA: Diagnosis not present

## 2015-12-01 DIAGNOSIS — Z6828 Body mass index (BMI) 28.0-28.9, adult: Secondary | ICD-10-CM | POA: Diagnosis not present

## 2015-12-01 DIAGNOSIS — M545 Low back pain: Secondary | ICD-10-CM | POA: Diagnosis not present

## 2015-12-01 DIAGNOSIS — N400 Enlarged prostate without lower urinary tract symptoms: Secondary | ICD-10-CM | POA: Diagnosis not present

## 2015-12-06 ENCOUNTER — Other Ambulatory Visit: Payer: Self-pay | Admitting: *Deleted

## 2015-12-06 DIAGNOSIS — I83893 Varicose veins of bilateral lower extremities with other complications: Secondary | ICD-10-CM

## 2015-12-07 ENCOUNTER — Ambulatory Visit (HOSPITAL_COMMUNITY)
Admission: RE | Admit: 2015-12-07 | Discharge: 2015-12-07 | Disposition: A | Discharge status: Home / Self Care | Payer: Medicare HMO | Source: Ambulatory Visit | Attending: Vascular Surgery | Admitting: Vascular Surgery

## 2015-12-07 ENCOUNTER — Other Ambulatory Visit: Payer: Self-pay | Admitting: Cardiovascular Disease

## 2015-12-07 ENCOUNTER — Encounter: Payer: Self-pay | Admitting: Vascular Surgery

## 2015-12-07 ENCOUNTER — Ambulatory Visit (INDEPENDENT_AMBULATORY_CARE_PROVIDER_SITE_OTHER): Payer: Medicare HMO | Admitting: Pharmacist Clinician (PhC)/ Clinical Pharmacy Specialist

## 2015-12-07 ENCOUNTER — Ambulatory Visit (INDEPENDENT_AMBULATORY_CARE_PROVIDER_SITE_OTHER): Payer: Medicare HMO | Admitting: Vascular Surgery

## 2015-12-07 VITALS — BP 194/80 | HR 62 | Ht 75.0 in | Wt 224.0 lb

## 2015-12-07 DIAGNOSIS — I83891 Varicose veins of right lower extremities with other complications: Secondary | ICD-10-CM | POA: Diagnosis not present

## 2015-12-07 DIAGNOSIS — I83893 Varicose veins of bilateral lower extremities with other complications: Secondary | ICD-10-CM | POA: Insufficient documentation

## 2015-12-07 DIAGNOSIS — I82592 Chronic embolism and thrombosis of other specified deep vein of left lower extremity: Secondary | ICD-10-CM | POA: Insufficient documentation

## 2015-12-07 DIAGNOSIS — Z7901 Long term (current) use of anticoagulants: Secondary | ICD-10-CM

## 2015-12-07 DIAGNOSIS — I83899 Varicose veins of unspecified lower extremities with other complications: Secondary | ICD-10-CM | POA: Insufficient documentation

## 2015-12-07 DIAGNOSIS — I4891 Unspecified atrial fibrillation: Secondary | ICD-10-CM | POA: Diagnosis not present

## 2015-12-07 DIAGNOSIS — R972 Elevated prostate specific antigen [PSA]: Secondary | ICD-10-CM | POA: Diagnosis not present

## 2015-12-07 LAB — PT WITH INR/FINGERSTICK
INR, fingerstick: 2.3 — ABNORMAL HIGH (ref 0.80–1.20)
PT FINGERSTICK: 28.1 s — AB (ref 10.4–12.5)

## 2015-12-07 MED ORDER — WARFARIN SODIUM 5 MG PO TABS: ORAL_TABLET | ORAL | Status: DC

## 2015-12-07 NOTE — Progress Notes (Signed)
Subjective:     Patient ID: Jason Alvarez, male   DOB: 1941-03-14, 75 y.o.   MRN: [id number]  HPI this 75 year old male was referred by Dr. Gerarda Fraction for evaluation of painful varicosities in the right leg. This patient has had bulging varicosities for many years in the right medial thigh and calf and severe skin changes as well. He was evaluated in our office previously by Dr. Oneida Alar and was found to have gross reflux in the right great saphenous vein and was treated with long leg elastic compression stockings 20-30 millimeter gradient as well as elevation and ibuprofen. He continues to have aching throbbing and burning discomfort and edema as the day progresses. His symptoms are affecting his daily living and have only worsened since trying the stockings. He has no history of DVT thrombophlebitis stasis ulcers or bleeding. He does take chronic Coumadin for atrial fibrillation which is intermittent.  Past Medical History  Diagnosis Date  . Thrombocytopenia ([place])   . ITP (idiopathic thrombocytopenic purpura)   . HTN (hypertension)   . DM (diabetes mellitus) ([name])   . A-fib ([name])     paf with NSVT on MCOT, on coumadin, echo 01/31/08-nl lv, mild aortic valve sclerosis   . Chronic anticoagulation   . Aneurysm ([name])   . Anxiety   . Chronic ITP (idiopathic thrombocytopenia) ([facility]) 04/10/2012    Low grade thrombocytopenia consistent with chronic ITP not in need of therapy.   . Carotid bruit     doppler 05/17/12-mild plaque with no diameter reduction  . H/O cardiovascular stress test 01/31/08    no ishcemia, ef 61%  . Hypertension   . Atrial fibrillation [facility])     Social History  Substance Use Topics  . Smoking status: Never Smoker   . Smokeless tobacco: Never Used  . Alcohol Use: No    Family History  Problem Relation Age of Onset  . Prostate cancer Father   . Cancer Sister     breast  . Cancer Brother     lung  . Melanoma Mother     Allergies  Allergen Reactions  . Penicillins Other  (See Comments)    Childhood allergy     Current outpatient prescriptions:  .  ALPRAZolam (XANAX) 1 MG tablet, Take 1 mg by mouth 3 (three) times daily as needed. For anxiety, Disp: , Rfl:  .  amLODipine (NORVASC) 5 MG tablet, Take 1 tablet (5 mg total) by mouth daily., Disp: 30 tablet, Rfl: 3 .  flecainide (TAMBOCOR) 50 MG tablet, Take 1.5 tablets (75 mg total) by mouth 2 (two) times daily., Disp: 90 tablet, Rfl: 6 .  glipiZIDE-metformin (METAGLIP) 5-500 MG per tablet, Take 1 tablet by mouth daily. , Disp: , Rfl:  .  hydrochlorothiazide (HYDRODIURIL) 25 MG tablet, Take 25 mg by mouth daily.  , Disp: , Rfl:  .  HYDROcodone-acetaminophen (VICODIN) 5-500 MG per tablet, Take 1 tablet by mouth every 8 (eight) hours as needed. For pain, Disp: , Rfl:  .  metoprolol succinate (TOPROL-XL) 50 MG 24 hr tablet, Take 1/2 tablet (25mg ) once a day, Disp: 90 tablet, Rfl: 3 .  nystatin cream (MYCOSTATIN), , Disp: , Rfl:  .  ramipril (ALTACE) 10 MG capsule, Take 10 mg by mouth 2 (two) times daily., Disp: , Rfl:  .  warfarin (COUMADIN) 5 MG tablet, Take 1-1.5 tablets by mouth daily as directed by coumadin clinic, Disp: 120 tablet, Rfl: 1  Filed Vitals:   12/07/15 0925 12/07/15 0926  BP: 185/88 194/80  Pulse: 62   Height: 6\' 3"  (1.905 m)   Weight: 224 lb (101.606 kg)   SpO2: 100%     Body mass index is 28 kg/(m^2).           Review of Systems denies chest pain, dyspnea on exertion, PND, orthopnea, hemoptysis. Patient does have a history of atrial fibrillation but no coronary artery disease. Has thrombocytopenia. Also has history of urinary incontinence. Has diabetes mellitus type 2. Does have occasional hematuria. Other systems negative and a complete review of systems    Objective:   Physical Exam BP 194/80 mmHg  Pulse 62  Ht 6\' 3"  (1.905 m)  Wt 224 lb (101.606 kg)  BMI 28.00 kg/m2  SpO2 100%    Gen.-alert and oriented x3 in no apparent distress HEENT normal for age Lungs no rhonchi or  wheezing Cardiovascular regular rhythm no murmurs carotid pulses 3+ palpable no bruits audible Abdomen soft nontender no palpable masses Musculoskeletal free of  major deformities Skin clear -no rashes Neurologic normal Lower extremities 3+ femoral and dorsalis pedis pulses palpable bilaterally with no edema on the left 1+ edema on the right Extensive bulging varicosities which were quite large in the medial mid and distal thigh extending posteriorly on the right leg. Also extensive network of bulging varicosities medially in the right leg below the knee with extensive network of reticular and spider veins into the bimalleolar area onto the dorsum of the foot. No active ulcer noted. Early hyperpigmentation lower third right leg. 3+ dorsalis pedis pulse palpable.  Today I performed a venous duplex exam in our vascular lab which revealed gross reflux throughout a large right great saphenous vein supplying these painful varicosities with no DVT in the right leg. There is some chronic DVT in the left leg.       Assessment:     Painful varicosities right leg due to gross reflux right great saphenous vein which is a large caliber vein supplying these painful extensive varicosities. Patient has failed conservative measures including long-leg elastic compression stockings 20-30 millimeter gradient, elevation, and ibuprofen. Symptoms are affecting his daily living and becoming worse and is threatening skin breakdown.    Plan:     Patient needs #1 laser ablation right great saphenous vein followed by three-month waiting period and then we'll return to be evaluated for possible painful varicosity phlebectomy We will proceed with precertification to perform this in the near future and hopefully improve his symptoms

## 2015-12-08 ENCOUNTER — Telehealth: Payer: Self-pay | Admitting: Cardiovascular Disease

## 2015-12-08 ENCOUNTER — Other Ambulatory Visit: Payer: Self-pay | Admitting: *Deleted

## 2015-12-08 DIAGNOSIS — I83811 Varicose veins of right lower extremities with pain: Secondary | ICD-10-CM

## 2015-12-08 NOTE — Telephone Encounter (Signed)
New message      Request for surgical clearance:  What type of surgery is being performed? Laser ablation or his rt great saphenous vein 1. When is this surgery scheduled? 12-27-15  Are there any medications that need to be held prior to surgery and how long? Stop warfarin?  If yes, when to stop it and when to restart it 2. Name of physician performing surgery?  Dr Tinnie Gens  3. What is your office phone and fax number?  Fax [phone]

## 2015-12-08 NOTE — Telephone Encounter (Signed)
FORWARD  AND DEFERRED TO DR Gwenlyn Found, ANTICOAG. CLINIC

## 2015-12-09 NOTE — Telephone Encounter (Signed)
Routing to pharmacy for advice on coumadin interruption.

## 2015-12-09 NOTE — Telephone Encounter (Signed)
Ok to hold for 5 days prior to procedure per protocol for AF with Chads2 <4. Resume usual dose evening of procedure or as directed by doctor. Have him check an INR about 7 days after resume (he usually goes to an outside lab and the results are sent to [place]).

## 2015-12-09 NOTE — Telephone Encounter (Signed)
OK to interrupt anticoag Rx for laser ablation

## 2015-12-09 NOTE — Telephone Encounter (Signed)
This message to be routed to provided number.  Called and left message with Kathlee Nations to call us back if she has any questions.

## 2015-12-17 ENCOUNTER — Encounter: Payer: Self-pay | Admitting: Vascular Surgery

## 2015-12-17 ENCOUNTER — Ambulatory Visit (INDEPENDENT_AMBULATORY_CARE_PROVIDER_SITE_OTHER): Payer: Medicare HMO | Admitting: Urology

## 2015-12-17 DIAGNOSIS — N401 Enlarged prostate with lower urinary tract symptoms: Secondary | ICD-10-CM | POA: Diagnosis not present

## 2015-12-17 DIAGNOSIS — R3915 Urgency of urination: Secondary | ICD-10-CM

## 2015-12-17 DIAGNOSIS — R972 Elevated prostate specific antigen [PSA]: Secondary | ICD-10-CM | POA: Diagnosis not present

## 2015-12-27 ENCOUNTER — Other Ambulatory Visit: Payer: Medicare HMO | Admitting: Vascular Surgery

## 2015-12-28 ENCOUNTER — Ambulatory Visit (HOSPITAL_COMMUNITY)
Admission: RE | Admit: 2015-12-28 | Discharge: 2015-12-28 | Disposition: A | Discharge status: Home / Self Care | Payer: Medicare HMO | Source: Ambulatory Visit | Attending: Nurse Practitioner | Admitting: Nurse Practitioner

## 2015-12-28 ENCOUNTER — Encounter (HOSPITAL_COMMUNITY): Payer: Self-pay | Admitting: Nurse Practitioner

## 2015-12-28 VITALS — BP 170/76 | HR 56 | Ht 75.0 in | Wt 223.4 lb

## 2015-12-28 DIAGNOSIS — Z7901 Long term (current) use of anticoagulants: Secondary | ICD-10-CM | POA: Insufficient documentation

## 2015-12-28 DIAGNOSIS — F419 Anxiety disorder, unspecified: Secondary | ICD-10-CM | POA: Insufficient documentation

## 2015-12-28 DIAGNOSIS — Z8042 Family history of malignant neoplasm of prostate: Secondary | ICD-10-CM | POA: Diagnosis not present

## 2015-12-28 DIAGNOSIS — Z801 Family history of malignant neoplasm of trachea, bronchus and lung: Secondary | ICD-10-CM | POA: Diagnosis not present

## 2015-12-28 DIAGNOSIS — Z803 Family history of malignant neoplasm of breast: Secondary | ICD-10-CM | POA: Diagnosis not present

## 2015-12-28 DIAGNOSIS — I10 Essential (primary) hypertension: Secondary | ICD-10-CM | POA: Insufficient documentation

## 2015-12-28 DIAGNOSIS — Z79899 Other long term (current) drug therapy: Secondary | ICD-10-CM | POA: Diagnosis not present

## 2015-12-28 DIAGNOSIS — E119 Type 2 diabetes mellitus without complications: Secondary | ICD-10-CM | POA: Diagnosis not present

## 2015-12-28 DIAGNOSIS — I480 Paroxysmal atrial fibrillation: Secondary | ICD-10-CM | POA: Insufficient documentation

## 2015-12-28 DIAGNOSIS — R69 Illness, unspecified: Secondary | ICD-10-CM | POA: Diagnosis not present

## 2015-12-28 DIAGNOSIS — Z880 Allergy status to penicillin: Secondary | ICD-10-CM | POA: Diagnosis not present

## 2015-12-28 DIAGNOSIS — I4891 Unspecified atrial fibrillation: Secondary | ICD-10-CM | POA: Diagnosis present

## 2015-12-28 MED ORDER — AMLODIPINE BESYLATE 10 MG PO TABS: 10.0000 mg | ORAL_TABLET | Freq: Every day | ORAL | Status: DC

## 2015-12-28 NOTE — Patient Instructions (Signed)
Your physician has recommended you make the following change in your medication:  1)Norvasc (Amlodipine) 10mg  once a day

## 2015-12-28 NOTE — Progress Notes (Signed)
Patient ID: Jason Alvarez, male   DOB: Jul 25, 1940, 75 y.o.   MRN: [id number]     Primary Care Physician: Glo Herring., MD Referring Physician: Dr. Charlyne Petrin is a 75 y.o. male with a h/o PAF on flecainide. He is doing well. Only one episode of afib since last visit, usually runs its course without having to take extra meds to break rhythm, longest run was 12 hours. He continues on warfarin without bleeding issues. BP at home elevated most of time around [id number] systolic and is elevated today.  Today, he denies symptoms of palpitations, chest pain, shortness of breath, orthopnea, PND, lower extremity edema, dizziness, presyncope, syncope, or neurologic sequela. The patient is tolerating medications without difficulties and is otherwise without complaint today.   Past Medical History  Diagnosis Date  . Thrombocytopenia ([place])   . ITP (idiopathic thrombocytopenic purpura)   . HTN (hypertension)   . DM (diabetes mellitus) ([place])   . A-fib ([place])     paf with NSVT on MCOT, on coumadin, echo 01/31/08-nl lv, mild aortic valve sclerosis   . Chronic anticoagulation   . Aneurysm ([place])   . Anxiety   . Chronic ITP (idiopathic thrombocytopenia) ([facility]) 04/10/2012    Low grade thrombocytopenia consistent with chronic ITP not in need of therapy.   . Carotid bruit     doppler 05/17/12-mild plaque with no diameter reduction  . H/O cardiovascular stress test 01/31/08    no ishcemia, ef 61%  . Hypertension   . Atrial fibrillation [facility])    Past Surgical History  Procedure Laterality Date  . Knee arthroscopy      right and left knee/1992 and 2000  . Vasectomy    . Circumcision    . Colonoscopy  06/25/2012    Procedure: COLONOSCOPY;  Surgeon: Jamesetta So, MD;  Location: [facility];  Service: Gastroenterology;  Laterality: N/A;    Current Outpatient Prescriptions  Medication Sig Dispense Refill  . ALPRAZolam (XANAX) 1 MG tablet Take 1 mg by mouth 3 (three) times daily as needed. For  anxiety    . amLODipine (NORVASC) 10 MG tablet Take 1 tablet (10 mg total) by mouth daily. 30 tablet 6  . flecainide (TAMBOCOR) 50 MG tablet Take 1.5 tablets (75 mg total) by mouth 2 (two) times daily. 90 tablet 6  . glipiZIDE-metformin (METAGLIP) 5-500 MG per tablet Take 1 tablet by mouth daily.     . hydrochlorothiazide (HYDRODIURIL) 25 MG tablet Take 25 mg by mouth daily.      Marland Kitchen HYDROcodone-acetaminophen (VICODIN) 5-500 MG per tablet Take 1 tablet by mouth every 8 (eight) hours as needed. For pain    . metoprolol succinate (TOPROL-XL) 50 MG 24 hr tablet Take 1/2 tablet (25mg ) once a day 90 tablet 3  . ramipril (ALTACE) 10 MG capsule Take 10 mg by mouth 2 (two) times daily.    Marland Kitchen warfarin (COUMADIN) 5 MG tablet Take 1-1.5 tablets by mouth daily as directed by coumadin clinic 40 tablet 5   No current facility-administered medications for this encounter.    Allergies  Allergen Reactions  . Penicillins Other (See Comments)    Childhood allergy    Social History   Social History  . Marital Status: Married    Spouse Name: N/A  . Number of Children: N/A  . Years of Education: N/A   Occupational History  . Not on file.   Social History Main Topics  . Smoking status: Never Smoker   .  Smokeless tobacco: Never Used  . Alcohol Use: No  . Drug Use: No  . Sexual Activity: Not on file   Other Topics Concern  . Not on file   Social History Narrative    Family History  Problem Relation Age of Onset  . Prostate cancer Father   . Cancer Sister     breast  . Cancer Brother     lung  . Melanoma Mother     ROS- All systems are reviewed and negative except as per the HPI above  Physical Exam: Filed Vitals:   12/28/15 0836  BP: 170/76  Pulse: 56  Height: 6\' 3"  (1.905 m)  Weight: 223 lb 6.4 oz (101.334 kg)    GEN- The patient is well appearing, alert and oriented x 3 today.   Head- normocephalic, atraumatic Eyes-  Sclera clear, conjunctiva pink Ears- hearing  intact Oropharynx- clear Neck- supple, no JVP Lymph- no cervical lymphadenopathy Lungs- Clear to ausculation bilaterally, normal work of breathing Heart- Regular rate and rhythm, no murmurs, rubs or gallops, PMI not laterally displaced GI- soft, NT, ND, + BS Extremities- no clubbing, cyanosis, or edema MS- no significant deformity or atrophy Skin- no rash or lesion Psych- euthymic mood, full affect Neuro- strength and sensation are intact  EKG- SR at 55 bpm, pr int 202 ms, qrs int 110 ms, qtc 438 ms Epic records reviewed  Assessment and Plan: 1. PAF Controlled on flecainide  Continue 75 mg bid Continue metoprolol Continue warfarin, followed by [facility]  2.HTN Not well controlled Elevated at home as well Continue ramipril, hctz Increase amlodipine to 10 mg a day from 5 mg a day Watch BP at home for effect Would like to keep =/< 140/85  2. Chadsvasc score of at least 4 Continue warfarin  F/u in 4 months  Butch Penny C. Patsie Mccardle, [facility] [address] [place], [place] [address] [phone]

## 2016-01-03 ENCOUNTER — Encounter (HOSPITAL_COMMUNITY): Payer: Medicare HMO

## 2016-01-03 ENCOUNTER — Ambulatory Visit: Payer: Medicare HMO | Admitting: Vascular Surgery

## 2016-01-04 ENCOUNTER — Encounter (HOSPITAL_COMMUNITY): Payer: Self-pay | Admitting: Occupational Therapy

## 2016-01-04 ENCOUNTER — Ambulatory Visit (HOSPITAL_COMMUNITY): Payer: Medicare HMO | Attending: Internal Medicine | Admitting: Occupational Therapy

## 2016-01-04 DIAGNOSIS — M6281 Muscle weakness (generalized): Secondary | ICD-10-CM | POA: Diagnosis not present

## 2016-01-04 DIAGNOSIS — M25562 Pain in left knee: Secondary | ICD-10-CM | POA: Insufficient documentation

## 2016-01-04 DIAGNOSIS — M25561 Pain in right knee: Secondary | ICD-10-CM | POA: Insufficient documentation

## 2016-01-04 NOTE — Therapy (Signed)
Jason Alvarez, [place], [address] Phone: [phone]   Fax:  [phone]  Occupational Therapy Wheelchair Evaluation  Patient Details  Name: Jason Alvarez MRN: [id number] Date of Birth: 1941-07-06 No Data Recorded  Encounter Date: 01/04/2016      OT End of Session - 01/04/16 1605    Visit Number 1   Number of Visits 1   Date for OT Re-Evaluation 01/05/16   Authorization Type [employer]   Authorization Time Period Before 10th visit   Authorization - Visit Number 1   Authorization - Number of Visits 10   OT Start Time [id number]   OT Stop Time 1340   OT Time Calculation (min) 35 min   Activity Tolerance Patient tolerated treatment well   Behavior During Therapy [facility] for tasks assessed/performed      Past Medical History  Diagnosis Date  . Thrombocytopenia ([place])   . ITP (idiopathic thrombocytopenic purpura)   . HTN (hypertension)   . DM (diabetes mellitus) ([place])   . A-fib ([place])     paf with NSVT on MCOT, on coumadin, echo 01/31/08-nl lv, mild aortic valve sclerosis   . Chronic anticoagulation   . Aneurysm ([place])   . Anxiety   . Chronic ITP (idiopathic thrombocytopenia) (H[facility]) 04/10/2012    Low grade thrombocytopenia consistent with chronic ITP not in need of therapy.   . Carotid bruit     doppler 05/17/12-mild plaque with no diameter reduction  . H/O cardiovascular stress test 01/31/08    no ishcemia, ef 61%  . Hypertension   . Atrial fibrillation [facility])     Past Surgical History  Procedure Laterality Date  . Knee arthroscopy      right and left knee/1992 and 2000  . Vasectomy    . Circumcision    . Colonoscopy  06/25/2012    Procedure: COLONOSCOPY;  Surgeon: Jason So, MD;  Location: [facility] ENDO SUITE;  Service: Gastroenterology;  Laterality: N/A;    There were no vitals filed for this visit.      Subjective Assessment - 01/04/16 1605    Currently in Pain? No/denies        Date:  01/04/2016 Patient Name: Jason Alvarez Address: [address], [place], [place] [address] DOB: 09/17/1946  To whom it may concern,   Jason Alvarez was seen today in this clinic for an evaluation for an electric scooter. Jason Alvarez has a medical history significant for atrial fibrillation, hypertension, DM, and osteoarthritis. Jason Alvarez entered the clinic independently with no assistive device, ambulated approximately 50 feet from the waiting area to ADL room with modified independence.   Jason Alvarez lives with his wife and is independent in all ADL and IADL tasks. Jason Alvarez is the caregiver for his wife, who is ambulatory with a walker however requires assistance occasionally for ADL tasks.   Jason Alvarez currently owns an electric scooter, standard walker, and single point cane that he uses for functional mobility purposes as needed. Jason Alvarez was approved for an electric scooter for the first time 10 years ago, and was approved for a replacement 5 years ago. Jason Alvarez presents to this clinic for evaluation for a replacement for his current scooter, which is in good repair however is out of warranty.      A FULL PHYSICAL ASSESSMENT REVEALS THE FOLLOWING   Existing Equipment: electric scooter, shower seat, standard walker, single point cane,   grab bars in shower    Transfers:  WNL   Head and Neck: WNL     Trunk: WNL   Pelvis: WNL     Hip: WNL   Knees: WNL   Feet and Ankles: WNL    Upper Extremities: BUE A/ROM WFL, strength 5/5   Lower Extremitites: BLE: Hip & knee strength 4/5, ankle strength 4/5. WFL   Weight Shifting Ability: WNL    Skin Integrity: No skin integrity issues reported    Cognition: WFL   Activity Tolerance:  Pt is able to complete shopping tasks for approximately 30-45 min before a rest break is needed.   GOALS/OBJECTIVE OF SEATING INTERVENTION:    Function: Jason Alvarez is currently independent in ADL, functional mobility, and IADL tasks using with no  assistive device both in the home and in the community.  Jason Alvarez does use an electric scooter in his home when experiencing knee pain, as well as uses it to go to the mailbox. This scooter is in good repair at this time. [name]' primary concern is his current scooter breaking or needing a battery now that the warranty has run out.   Jason Alvarez is concerned about reported pain and weakness in his knees and lower legs.  He has stated that he is able to move about the community without assistive device, and only uses the scooter in his home. Additionally, [name] walked into the rehabilitation office and to the ADL room, measuring greater than 100 feet total with no difficulties. At this time, I do not feel Jason Alvarez qualifies for a new electric scooter as he is independent in mobility with no device.  I would recommend a physical therapy evaluation to determine if the pain can be lessened and strength improved.     If you require any further information concerning Jason Alvarez, independence or mobility needs; or any further information why a lesser device will not work, please do not hesitate to contact me at [place] [place], [place] [address] [phone].     Visit Diagnosis: Pain in left knee  Pain in right knee  Muscle weakness (generalized)    Problem List Patient Active Problem List   Diagnosis Date Noted  . Varicose veins of leg with complications [id number]  . Swelling of limb 12/03/2013  . Pain in limb 12/03/2013  . Atrial fibrillation ([place]) 05/19/2013  . Atrial tachycardia ([place]) 04/02/2013  . Long term (current) use of anticoagulants 10/07/2012  . Chronic ITP (idiopathic thrombocytopenia) (H[facility]) 04/10/2012  . Varicose veins of lower extremities with other complications [id number]  . DIABETES MELLITUS 07/15/2010  . Essential hypertension 07/15/2010  . OSTEOARTHRITIS 07/15/2010  . OTHER DYSPHAGIA  07/15/2010  . COLONIC POLYPS, HYPERPLASTIC, HX OF 07/15/2010    Jason Alvarez, OTR/L  [phone]  01/04/2016, 4:07 PM  [place], [place], [address] Phone: [phone]   Fax:  [phone]  Name: Jason Alvarez MRN: [id number] Date of Birth: January 26, 1941

## 2016-01-10 ENCOUNTER — Ambulatory Visit (INDEPENDENT_AMBULATORY_CARE_PROVIDER_SITE_OTHER): Payer: Medicare HMO | Admitting: Pharmacist Clinician (PhC)/ Clinical Pharmacy Specialist

## 2016-01-10 ENCOUNTER — Other Ambulatory Visit: Payer: Self-pay | Admitting: Cardiovascular Disease

## 2016-01-10 ENCOUNTER — Encounter: Payer: Self-pay | Admitting: Vascular Surgery

## 2016-01-10 DIAGNOSIS — Z7901 Long term (current) use of anticoagulants: Secondary | ICD-10-CM

## 2016-01-10 DIAGNOSIS — I4891 Unspecified atrial fibrillation: Secondary | ICD-10-CM | POA: Diagnosis not present

## 2016-01-10 LAB — PT WITH INR/FINGERSTICK
INR, fingerstick: 2.7 — ABNORMAL HIGH (ref 0.80–1.20)
PT, fingerstick: 31.9 seconds — ABNORMAL HIGH (ref 10.4–12.5)

## 2016-01-12 ENCOUNTER — Encounter: Payer: Self-pay | Admitting: Vascular Surgery

## 2016-01-17 ENCOUNTER — Ambulatory Visit (INDEPENDENT_AMBULATORY_CARE_PROVIDER_SITE_OTHER): Payer: Medicare HMO | Admitting: Vascular Surgery

## 2016-01-17 ENCOUNTER — Encounter: Payer: Self-pay | Admitting: Vascular Surgery

## 2016-01-17 VITALS — BP 183/80 | HR 71 | Temp 98.0°F | Resp 16 | Ht 75.0 in | Wt 223.0 lb

## 2016-01-17 DIAGNOSIS — I83891 Varicose veins of right lower extremities with other complications: Secondary | ICD-10-CM | POA: Insufficient documentation

## 2016-01-17 NOTE — Progress Notes (Signed)
Subjective:     Patient ID: Jason Alvarez, male   DOB: April 04, 1941, 75 y.o.   MRN: [id number]  HPI this 75 year old male had laser ablation of the right great saphenous vein from the distal thigh to near the saphenofemoral junction performed under local tumescent anesthesia. A total of 1779 J of energy was utilized. He tolerated the procedure well.   Review of Systems     Objective:   Physical Exam BP 183/80 mmHg  Pulse 71  Temp(Src) 98 F (36.7 C)  Resp 16  Ht 6\' 3"  (1.905 m)  Wt 223 lb (101.152 kg)  BMI 27.87 kg/m2  SpO2 99%       Assessment:     Well-tolerated laser ablation right great saphenous vein for gross reflux with painful varicosities performed under local tumescent anesthesia    Plan:     Return in 1 week for venous duplex exam right leg to confirm closure Patient will then return in 3 months to be evaluated for possible stab phlebectomy of residual painful varicosities

## 2016-01-17 NOTE — Progress Notes (Signed)
Laser Ablation Procedure    Date: 01/17/2016   Jason Alvarez DOB:August 10, 1940  Consent signed: Yes    Surgeon:  Dr. Nelda Severe. Kellie Simmering  Procedure: Laser Ablation: right Greater Saphenous Vein  BP 183/80 mmHg  Pulse 71  Temp(Src) 98 F (36.7 C)  Resp 16  Ht 6\' 3"  (1.905 m)  Wt 223 lb (101.152 kg)  BMI 27.87 kg/m2  SpO2 99%  Tumescent Anesthesia: 300 cc 0.9% NaCl with 50 cc Lidocaine HCL with 1% Epi and 15 cc 8.4% NaHCO3  Local Anesthesia: 3 cc Lidocaine HCL and NaHCO3 (ratio 2:1)  Pulsed Mode: 15 watts, 519ms delay, 1.0 duration  Total Energy:1779              Total Pulses:       119         Total Time: 1:58    Patient tolerated procedure well  Notes:   Description of Procedure:  After marking the course of the secondary varicosities, the patient was placed on the operating table in the supine position, and the right leg was prepped and draped in sterile fashion.   Local anesthetic was administered and under ultrasound guidance the saphenous vein was accessed with a micro needle and guide wire; then the mirco puncture sheath was placed.  A guide wire was inserted saphenofemoral junction , followed by a 5 french sheath.  The position of the sheath and then the laser fiber below the junction was confirmed using the ultrasound.  Tumescent anesthesia was administered along the course of the saphenous vein using ultrasound guidance. The patient was placed in Trendelenburg position and protective laser glasses were placed on patient and staff, and the laser was fired at 15 watts continuous mode advancing 1-11mm/second for a total of 1779 joules.     Steri strips were applied to the stab wounds and ABD pads and thigh high compression stockings were applied.  Ace wrap bandages were applied over the phlebectomy sites and at the top of the saphenofemoral junction. Blood loss was less than 15 cc.  The patient ambulated out of the operating room having tolerated the procedure well.

## 2016-01-19 ENCOUNTER — Encounter: Payer: Self-pay | Admitting: Vascular Surgery

## 2016-01-19 ENCOUNTER — Telehealth: Payer: Self-pay | Admitting: Cardiovascular Disease

## 2016-01-19 NOTE — Telephone Encounter (Signed)
FORWARD TO ANTICOAG POOL

## 2016-01-19 NOTE — Telephone Encounter (Signed)
Returned call to patient. He states that provider doing surgery would like him to have INR checked next week after resuming warfarin. He is checked at [facility] labs in [place] and has a monthly standing order there. Will send order for him to be checked next week after resumption of warfarin. Will also update order so that he can be checked more frequently if required.

## 2016-01-19 NOTE — Telephone Encounter (Signed)
New message   Pt is calling for the RN he is confused on his warfarin checks since his surgery

## 2016-01-20 ENCOUNTER — Telehealth: Payer: Self-pay | Admitting: *Deleted

## 2016-01-20 NOTE — Telephone Encounter (Signed)
Returning Jason Alvarez voice message regarding right groin swelling.  Jason Alvarez is s/p endovenous laser ablation right greater saphenous vein on 01-17-2016 by Dr. Tinnie Gens.  Jason Alvarez states that yesterday he had swelling in his right groin area that has improved/swelling has resolved today.  Jason Alvarez states he has bruising along the right inner thigh (area treated) but no right leg pain and no right foot or ankle swelling.  Reminded Jason Alvarez to wear his thigh high compression hose and to keep it pulled up to cover right groin area.  Jason Alvarez states the compression hose tend to slip down so advised him to wrap Ace bandage over top of hose and right groin for added compression and to help compression hose stay up.  Advised him to use ice compress to right groin area as needed and to apply Arnicare to right inner thigh and groin to help with bruising.  Advised him to keep right leg elevated when sitting and reminded him of post LA duplex and [facility] follow up appointment with Dr. Kellie Simmering on 01-24-2016.  Jason Alvarez to call [facility] if he has further questions or concerns.

## 2016-01-24 ENCOUNTER — Ambulatory Visit (INDEPENDENT_AMBULATORY_CARE_PROVIDER_SITE_OTHER): Payer: Medicare HMO | Admitting: Pharmacist Clinician (PhC)/ Clinical Pharmacy Specialist

## 2016-01-24 ENCOUNTER — Other Ambulatory Visit: Payer: Self-pay | Admitting: Cardiovascular Disease

## 2016-01-24 ENCOUNTER — Ambulatory Visit (INDEPENDENT_AMBULATORY_CARE_PROVIDER_SITE_OTHER): Payer: Medicare HMO | Admitting: Vascular Surgery

## 2016-01-24 ENCOUNTER — Ambulatory Visit (HOSPITAL_COMMUNITY)
Admission: RE | Admit: 2016-01-24 | Discharge: 2016-01-24 | Disposition: A | Discharge status: Home / Self Care | Payer: Medicare HMO | Source: Ambulatory Visit | Attending: Vascular Surgery | Admitting: Vascular Surgery

## 2016-01-24 VITALS — BP 166/65 | HR 67 | Temp 97.6°F | Resp 18 | Ht 75.0 in | Wt 215.0 lb

## 2016-01-24 DIAGNOSIS — R69 Illness, unspecified: Secondary | ICD-10-CM | POA: Diagnosis not present

## 2016-01-24 DIAGNOSIS — F419 Anxiety disorder, unspecified: Secondary | ICD-10-CM | POA: Insufficient documentation

## 2016-01-24 DIAGNOSIS — I83891 Varicose veins of right lower extremities with other complications: Secondary | ICD-10-CM | POA: Diagnosis not present

## 2016-01-24 DIAGNOSIS — I10 Essential (primary) hypertension: Secondary | ICD-10-CM | POA: Diagnosis not present

## 2016-01-24 DIAGNOSIS — Z9889 Other specified postprocedural states: Secondary | ICD-10-CM | POA: Insufficient documentation

## 2016-01-24 DIAGNOSIS — E119 Type 2 diabetes mellitus without complications: Secondary | ICD-10-CM | POA: Diagnosis not present

## 2016-01-24 DIAGNOSIS — I83811 Varicose veins of right lower extremities with pain: Secondary | ICD-10-CM | POA: Insufficient documentation

## 2016-01-24 DIAGNOSIS — I4891 Unspecified atrial fibrillation: Secondary | ICD-10-CM | POA: Diagnosis not present

## 2016-01-24 DIAGNOSIS — Z7901 Long term (current) use of anticoagulants: Secondary | ICD-10-CM | POA: Insufficient documentation

## 2016-01-24 DIAGNOSIS — I868 Varicose veins of other specified sites: Secondary | ICD-10-CM

## 2016-01-24 LAB — PT WITH INR/FINGERSTICK
INR FINGERSTICK: 1.4 — AB (ref 0.80–1.20)
PT, fingerstick: 16.4 seconds — ABNORMAL HIGH (ref 10.4–12.5)

## 2016-01-24 LAB — PROTIME-INR: INR: 1.4 — AB (ref <=1.1)

## 2016-01-24 NOTE — Progress Notes (Signed)
Subjective:     Patient ID: Jason Alvarez, male   DOB: 03-Feb-1941, 75 y.o.   MRN: [id number]  HPI this 75 year old male returns 1 week post-laser ablation right great saphenous vein for gross reflux with painful varicosities. He has had minimal discomfort along the course of the great saphenous vein from the distal thigh to the inguinal crease. He did has had some moderate bruising. He has worn his elastic compression stockings as instructed and taken ibuprofen for pain. He has noted some "nodules" in the lower thigh below the entrance site. He has no history of DVT or thrombophlebitis.  Past Medical History  Diagnosis Date  . Thrombocytopenia ([place])   . ITP (idiopathic thrombocytopenic purpura)   . HTN (hypertension)   . DM (diabetes mellitus) ([place])   . A-fib ([place])     paf with NSVT on MCOT, on coumadin, echo 01/31/08-nl lv, mild aortic valve sclerosis   . Chronic anticoagulation   . Aneurysm ([place])   . Anxiety   . Chronic ITP (idiopathic thrombocytopenia) (HCC) 04/10/2012    Low grade thrombocytopenia consistent with chronic ITP not in need of therapy.   . Carotid bruit     doppler 05/17/12-mild plaque with no diameter reduction  . H/O cardiovascular stress test 01/31/08    no ishcemia, ef 61%  . Hypertension   . Atrial fibrillation [facility])     Social History  Substance Use Topics  . Smoking status: Never Smoker   . Smokeless tobacco: Never Used  . Alcohol Use: No    Family History  Problem Relation Age of Onset  . Prostate cancer Father   . Cancer Sister     breast  . Cancer Brother     lung  . Melanoma Mother     Allergies  Allergen Reactions  . Penicillins Other (See Comments)    Childhood allergy     Current outpatient prescriptions:  .  ALPRAZolam (XANAX) 1 MG tablet, Take 1 mg by mouth 3 (three) times daily as needed. For anxiety, Disp: , Rfl:  .  amLODipine (NORVASC) 10 MG tablet, Take 1 tablet (10 mg total) by mouth daily., Disp: 30 tablet, Rfl: 6 .  flecainide  (TAMBOCOR) 50 MG tablet, Take 1.5 tablets (75 mg total) by mouth 2 (two) times daily., Disp: 90 tablet, Rfl: 6 .  glipiZIDE-metformin (METAGLIP) 5-500 MG per tablet, Take 1 tablet by mouth daily. , Disp: , Rfl:  .  hydrochlorothiazide (HYDRODIURIL) 25 MG tablet, Take 25 mg by mouth daily.  , Disp: , Rfl:  .  HYDROcodone-acetaminophen (VICODIN) 5-500 MG per tablet, Take 1 tablet by mouth every 8 (eight) hours as needed. For pain, Disp: , Rfl:  .  metoprolol succinate (TOPROL-XL) 50 MG 24 hr tablet, Take 1/2 tablet (25mg ) once a day, Disp: 90 tablet, Rfl: 3 .  ramipril (ALTACE) 10 MG capsule, Take 10 mg by mouth 2 (two) times daily., Disp: , Rfl:  .  warfarin (COUMADIN) 5 MG tablet, Take 1-1.5 tablets by mouth daily as directed by coumadin clinic, Disp: 40 tablet, Rfl: 5  Filed Vitals:   01/24/16 1344 01/24/16 1347  BP: 177/71 166/65  Pulse: 68 67  Temp: 97.6 F (36.4 C)   Resp: 18   Height: 6\' 3"  (1.905 m)   Weight: 215 lb (97.523 kg)   SpO2: 97%     Body mass index is 26.87 kg/(m^2).           Review of Systems denies chest pain, dyspnea on exertion,  PND, orthopnea. Patient has resumed his Coumadin therapy. Has history of ITP.     Objective:   Physical Exam BP 166/65 mmHg  Pulse 67  Temp(Src) 97.6 F (36.4 C)  Resp 18  Ht 6\' 3"  (1.905 m)  Wt 215 lb (97.523 kg)  BMI 26.87 kg/m2  SpO2 97%  Gen. well-developed well-nourished male no apparent distress alert and oriented 3 Lungs no rhonchi or wheezing Right leg with moderate ecchymosis from inguinal crease to distal thigh anterior and posterior to great saphenous vein. Mild tenderness to deep palpation over saphenous vein. Some large varicosities distal to entrance site have thrombosed and are mildly tender. No erythema or inflammation. Continues to have extensive pattern of varicosities beginning in the distal thigh extending into the pretibial region with extensive reticular veins and chronic edema. 3+ dorsalis pedis  pulse palpable.  Today I ordered a venous duplex exam the right leg which I reviewed and interpreted. There is no DVT. There is total closure of the right great saphenous vein from the distal thigh to near the saphenofemoral junction.     Assessment:     Successful laser ablation right great saphenous vein for gross reflux with painful varicosities and swelling Interval thrombosis of some of the large varicosities in the distal thigh    Plan:     Return in 3 months to determine if stab phlebectomy of residual painful varicosities will be indicated

## 2016-01-24 NOTE — Progress Notes (Signed)
Filed Vitals:   01/24/16 1344 01/24/16 1347  BP: 177/71 166/65  Pulse: 68 67  Temp: 97.6 F (36.4 C)   Resp: 18   Height: 6\' 3"  (1.905 m)   Weight: 215 lb (97.523 kg)   SpO2: 97%

## 2016-02-07 ENCOUNTER — Ambulatory Visit (INDEPENDENT_AMBULATORY_CARE_PROVIDER_SITE_OTHER): Payer: Medicare HMO | Admitting: Pharmacist

## 2016-02-07 ENCOUNTER — Other Ambulatory Visit: Payer: Self-pay | Admitting: Cardiovascular Disease

## 2016-02-07 DIAGNOSIS — Z7901 Long term (current) use of anticoagulants: Secondary | ICD-10-CM

## 2016-02-07 DIAGNOSIS — I4891 Unspecified atrial fibrillation: Secondary | ICD-10-CM | POA: Diagnosis not present

## 2016-02-07 LAB — PT WITH INR/FINGERSTICK
INR, fingerstick: 1.8 — ABNORMAL HIGH (ref 0.80–1.20)
PT, fingerstick: 21.7 seconds — ABNORMAL HIGH (ref 10.4–12.5)

## 2016-02-07 LAB — POCT INR: INR: 1.8

## 2016-02-21 ENCOUNTER — Ambulatory Visit (INDEPENDENT_AMBULATORY_CARE_PROVIDER_SITE_OTHER): Payer: Medicare HMO | Admitting: Pharmacist Clinician (PhC)/ Clinical Pharmacy Specialist

## 2016-02-21 ENCOUNTER — Other Ambulatory Visit: Payer: Self-pay | Admitting: Cardiovascular Disease

## 2016-02-21 DIAGNOSIS — I4891 Unspecified atrial fibrillation: Secondary | ICD-10-CM | POA: Diagnosis not present

## 2016-02-21 DIAGNOSIS — Z7901 Long term (current) use of anticoagulants: Secondary | ICD-10-CM | POA: Diagnosis not present

## 2016-02-21 LAB — PT WITH INR/FINGERSTICK
INR, fingerstick: 2.1 — ABNORMAL HIGH (ref 0.80–1.20)
PT, fingerstick: 25.3 seconds — ABNORMAL HIGH (ref 10.4–12.5)

## 2016-02-22 DIAGNOSIS — I4891 Unspecified atrial fibrillation: Secondary | ICD-10-CM | POA: Diagnosis not present

## 2016-02-22 DIAGNOSIS — Z0000 Encounter for general adult medical examination without abnormal findings: Secondary | ICD-10-CM | POA: Diagnosis not present

## 2016-02-22 DIAGNOSIS — R69 Illness, unspecified: Secondary | ICD-10-CM | POA: Diagnosis not present

## 2016-02-22 DIAGNOSIS — E119 Type 2 diabetes mellitus without complications: Secondary | ICD-10-CM | POA: Diagnosis not present

## 2016-02-22 DIAGNOSIS — I10 Essential (primary) hypertension: Secondary | ICD-10-CM | POA: Diagnosis not present

## 2016-03-13 ENCOUNTER — Ambulatory Visit (INDEPENDENT_AMBULATORY_CARE_PROVIDER_SITE_OTHER): Payer: Medicare HMO | Admitting: Pharmacist Clinician (PhC)/ Clinical Pharmacy Specialist

## 2016-03-13 ENCOUNTER — Other Ambulatory Visit: Payer: Self-pay | Admitting: Cardiovascular Disease

## 2016-03-13 DIAGNOSIS — Z7901 Long term (current) use of anticoagulants: Secondary | ICD-10-CM

## 2016-03-13 DIAGNOSIS — I4891 Unspecified atrial fibrillation: Secondary | ICD-10-CM | POA: Diagnosis not present

## 2016-03-13 LAB — PT WITH INR/FINGERSTICK
INR FINGERSTICK: 2.8 — AB (ref 0.80–1.20)
PT, fingerstick: 33.7 seconds — ABNORMAL HIGH (ref 10.4–12.5)

## 2016-03-24 DIAGNOSIS — Z23 Encounter for immunization: Secondary | ICD-10-CM | POA: Diagnosis not present

## 2016-03-24 DIAGNOSIS — N400 Enlarged prostate without lower urinary tract symptoms: Secondary | ICD-10-CM | POA: Diagnosis not present

## 2016-03-24 DIAGNOSIS — R972 Elevated prostate specific antigen [PSA]: Secondary | ICD-10-CM | POA: Diagnosis not present

## 2016-03-24 DIAGNOSIS — K219 Gastro-esophageal reflux disease without esophagitis: Secondary | ICD-10-CM | POA: Diagnosis not present

## 2016-03-24 DIAGNOSIS — E1140 Type 2 diabetes mellitus with diabetic neuropathy, unspecified: Secondary | ICD-10-CM | POA: Diagnosis not present

## 2016-03-24 DIAGNOSIS — Z6827 Body mass index (BMI) 27.0-27.9, adult: Secondary | ICD-10-CM | POA: Diagnosis not present

## 2016-03-31 ENCOUNTER — Ambulatory Visit (INDEPENDENT_AMBULATORY_CARE_PROVIDER_SITE_OTHER): Payer: Medicare HMO | Admitting: Urology

## 2016-03-31 ENCOUNTER — Other Ambulatory Visit (HOSPITAL_COMMUNITY)
Admission: RE | Admit: 2016-03-31 | Discharge: 2016-03-31 | Disposition: A | Discharge status: Home / Self Care | Payer: Medicare HMO | Source: Other Acute Inpatient Hospital | Attending: Urology | Admitting: Urology

## 2016-03-31 DIAGNOSIS — R972 Elevated prostate specific antigen [PSA]: Secondary | ICD-10-CM

## 2016-03-31 DIAGNOSIS — R3121 Asymptomatic microscopic hematuria: Secondary | ICD-10-CM | POA: Diagnosis not present

## 2016-03-31 DIAGNOSIS — N401 Enlarged prostate with lower urinary tract symptoms: Secondary | ICD-10-CM | POA: Diagnosis not present

## 2016-03-31 DIAGNOSIS — R3915 Urgency of urination: Secondary | ICD-10-CM

## 2016-03-31 LAB — URINALYSIS, ROUTINE W REFLEX MICROSCOPIC
Bilirubin Urine: NEGATIVE
Glucose, UA: 100 mg/dL — AB
Ketones, ur: NEGATIVE mg/dL
Leukocytes, UA: NEGATIVE
NITRITE: NEGATIVE
PH: 5.5 (ref 5.0–8.0)
Protein, ur: 30 mg/dL — AB
SPECIFIC GRAVITY, URINE: 1.025 (ref 1.005–1.030)

## 2016-03-31 LAB — URINE MICROSCOPIC-ADD ON
Bacteria, UA: NONE SEEN
SQUAMOUS EPITHELIAL / LPF: NONE SEEN
WBC UA: NONE SEEN WBC/hpf (ref 0–5)

## 2016-04-11 ENCOUNTER — Ambulatory Visit (INDEPENDENT_AMBULATORY_CARE_PROVIDER_SITE_OTHER): Payer: Medicare HMO | Admitting: Pharmacist

## 2016-04-11 ENCOUNTER — Other Ambulatory Visit: Payer: Self-pay | Admitting: Urology

## 2016-04-11 ENCOUNTER — Other Ambulatory Visit: Payer: Self-pay | Admitting: Cardiovascular Disease

## 2016-04-11 DIAGNOSIS — R972 Elevated prostate specific antigen [PSA]: Secondary | ICD-10-CM

## 2016-04-11 DIAGNOSIS — Z7901 Long term (current) use of anticoagulants: Secondary | ICD-10-CM

## 2016-04-11 DIAGNOSIS — I4891 Unspecified atrial fibrillation: Secondary | ICD-10-CM | POA: Diagnosis not present

## 2016-04-11 LAB — PT WITH INR/FINGERSTICK
INR FINGERSTICK: 2.3 — AB (ref 0.80–1.20)
PT FINGERSTICK: 27.5 s — AB (ref 10.4–12.5)

## 2016-04-26 ENCOUNTER — Ambulatory Visit (HOSPITAL_COMMUNITY)
Admission: RE | Admit: 2016-04-26 | Discharge: 2016-04-26 | Disposition: A | Discharge status: Home / Self Care | Payer: Medicare HMO | Source: Ambulatory Visit | Attending: Urology | Admitting: Urology

## 2016-04-26 DIAGNOSIS — R972 Elevated prostate specific antigen [PSA]: Secondary | ICD-10-CM | POA: Insufficient documentation

## 2016-04-26 DIAGNOSIS — R9721 Rising PSA following treatment for malignant neoplasm of prostate: Secondary | ICD-10-CM | POA: Diagnosis not present

## 2016-04-26 LAB — POCT I-STAT CREATININE: Creatinine, Ser: 1 mg/dL (ref 0.61–1.24)

## 2016-04-26 MED ORDER — GADOBENATE DIMEGLUMINE 529 MG/ML IV SOLN
20.0000 mL | Freq: Once | INTRAVENOUS | Status: AC | PRN
  Administered 2016-04-26: 20 mL via INTRAVENOUS

## 2016-04-27 ENCOUNTER — Encounter: Payer: Self-pay | Admitting: Vascular Surgery

## 2016-04-27 ENCOUNTER — Other Ambulatory Visit: Payer: Self-pay

## 2016-04-27 ENCOUNTER — Other Ambulatory Visit: Payer: Self-pay | Admitting: Cardiovascular Disease

## 2016-04-27 MED ORDER — METOPROLOL SUCCINATE ER 50 MG PO TB24: ORAL_TABLET | ORAL | 0 refills | Status: DC

## 2016-05-01 ENCOUNTER — Ambulatory Visit: Payer: Medicare HMO | Admitting: Vascular Surgery

## 2016-05-01 ENCOUNTER — Encounter (HOSPITAL_COMMUNITY): Payer: Self-pay | Admitting: Nurse Practitioner

## 2016-05-01 ENCOUNTER — Ambulatory Visit (HOSPITAL_COMMUNITY)
Admission: RE | Admit: 2016-05-01 | Discharge: 2016-05-01 | Disposition: A | Discharge status: Home / Self Care | Payer: Medicare HMO | Source: Ambulatory Visit | Attending: Nurse Practitioner | Admitting: Nurse Practitioner

## 2016-05-01 VITALS — BP 200/80 | HR 58 | Ht 75.0 in | Wt 219.4 lb

## 2016-05-01 DIAGNOSIS — I10 Essential (primary) hypertension: Secondary | ICD-10-CM | POA: Diagnosis not present

## 2016-05-01 DIAGNOSIS — R69 Illness, unspecified: Secondary | ICD-10-CM | POA: Diagnosis not present

## 2016-05-01 DIAGNOSIS — E119 Type 2 diabetes mellitus without complications: Secondary | ICD-10-CM | POA: Diagnosis not present

## 2016-05-01 DIAGNOSIS — Z808 Family history of malignant neoplasm of other organs or systems: Secondary | ICD-10-CM | POA: Diagnosis not present

## 2016-05-01 DIAGNOSIS — Z8042 Family history of malignant neoplasm of prostate: Secondary | ICD-10-CM | POA: Insufficient documentation

## 2016-05-01 DIAGNOSIS — F419 Anxiety disorder, unspecified: Secondary | ICD-10-CM | POA: Diagnosis not present

## 2016-05-01 DIAGNOSIS — D693 Immune thrombocytopenic purpura: Secondary | ICD-10-CM | POA: Insufficient documentation

## 2016-05-01 DIAGNOSIS — Z803 Family history of malignant neoplasm of breast: Secondary | ICD-10-CM | POA: Insufficient documentation

## 2016-05-01 DIAGNOSIS — Z7901 Long term (current) use of anticoagulants: Secondary | ICD-10-CM | POA: Insufficient documentation

## 2016-05-01 DIAGNOSIS — I480 Paroxysmal atrial fibrillation: Secondary | ICD-10-CM

## 2016-05-01 DIAGNOSIS — Z7984 Long term (current) use of oral hypoglycemic drugs: Secondary | ICD-10-CM | POA: Insufficient documentation

## 2016-05-01 DIAGNOSIS — Z79899 Other long term (current) drug therapy: Secondary | ICD-10-CM | POA: Diagnosis not present

## 2016-05-01 DIAGNOSIS — Z880 Allergy status to penicillin: Secondary | ICD-10-CM | POA: Insufficient documentation

## 2016-05-01 DIAGNOSIS — Z801 Family history of malignant neoplasm of trachea, bronchus and lung: Secondary | ICD-10-CM | POA: Insufficient documentation

## 2016-05-01 NOTE — Progress Notes (Signed)
Patient ID: Jason Alvarez, male   DOB: 07/13/1941, 75 y.o.   MRN: [id number]     Primary Care Physician: Jason Alvarez., MD Referring Physician: Dr. Charlyne Alvarez is a 75 y.o. male with a h/o PAF on flecainide. He is doing well.  A few episodes of afib usually runs its course without having to take extra meds to break rhythm, longest run was 12 hours. He continues on warfarin without bleeding issues. BP is elevated, just took meds about 30 mins ago and ate a biscuit this am. Has been eating a lot of stews and soups last week. He says at home it usually runs around [id number] systolic.   Today, he denies symptoms of palpitations, chest pain, shortness of breath, orthopnea, PND, lower extremity edema, dizziness, presyncope, syncope, or neurologic sequela. The patient is tolerating medications without difficulties and is otherwise without complaint today.   Past Medical History:  Diagnosis Date  . A-fib ([place])    paf with NSVT on MCOT, on coumadin, echo 01/31/08-nl lv, mild aortic valve sclerosis   . Aneurysm ([place])   . Anxiety   . Atrial fibrillation ([place])   . Carotid bruit    doppler 05/17/12-mild plaque with no diameter reduction  . Chronic anticoagulation   . Chronic ITP (idiopathic thrombocytopenia) ([facility]) 04/10/2012   Low grade thrombocytopenia consistent with chronic ITP not in need of therapy.   . DM (diabetes mellitus) ([place])   . H/O cardiovascular stress test 01/31/08   no ishcemia, ef 61%  . HTN (hypertension)   . Hypertension   . ITP (idiopathic thrombocytopenic purpura)   . Thrombocytopenia ([place])    Past Surgical History:  Procedure Laterality Date  . CIRCUMCISION    . COLONOSCOPY  06/25/2012   Procedure: COLONOSCOPY;  Surgeon: Jason So, MD;  Location: [facility];  Service: Gastroenterology;  Laterality: N/A;  . KNEE ARTHROSCOPY     right and left knee/1992 and 2000  . VASECTOMY      Current Outpatient Prescriptions  Medication Sig Dispense Refill  .  ALPRAZolam (XANAX) 1 MG tablet Take 1 mg by mouth 3 (three) times daily as needed. For anxiety    . amLODipine (NORVASC) 10 MG tablet Take 1 tablet (10 mg total) by mouth daily. 30 tablet 6  . flecainide (TAMBOCOR) 50 MG tablet Take 1.5 tablets (75 mg total) by mouth 2 (two) times daily. 90 tablet 6  . glipiZIDE-metformin (METAGLIP) 5-500 MG per tablet Take 1 tablet by mouth daily.     . hydrochlorothiazide (HYDRODIURIL) 25 MG tablet Take 25 mg by mouth daily.      Marland Kitchen HYDROcodone-acetaminophen (VICODIN) 5-500 MG per tablet Take 1 tablet by mouth every 8 (eight) hours as needed. For pain    . metoprolol succinate (TOPROL-XL) 50 MG 24 hr tablet Take 1/2 tablet (25mg ) once a day 45 tablet 0  . ramipril (ALTACE) 10 MG capsule Take 10 mg by mouth 2 (two) times daily.    Marland Kitchen warfarin (COUMADIN) 5 MG tablet Take 1-1.5 tablets by mouth daily as directed by coumadin clinic 40 tablet 5   No current facility-administered medications for this encounter.     Allergies  Allergen Reactions  . Penicillins Other (See Comments)    Childhood allergy    Social History   Social History  . Marital status: Married    Spouse name: N/A  . Number of children: N/A  . Years of education: N/A   Occupational History  . Not  on file.   Social History Main Topics  . Smoking status: Never Smoker  . Smokeless tobacco: Never Used  . Alcohol use No  . Drug use: No  . Sexual activity: Not on file   Other Topics Concern  . Not on file   Social History Narrative  . No narrative on file    Family History  Problem Relation Age of Onset  . Prostate cancer Father   . Cancer Sister     breast  . Cancer Brother     lung  . Melanoma Mother     ROS- All systems are reviewed and negative except as per the HPI above  Physical Exam: Vitals:   05/01/16 0845 05/01/16 0900  BP: (!) 210/84 (!) 200/80  Pulse: (!) 58   Weight: 219 lb 6.4 oz (99.5 kg)   Height: 6\' 3"  (1.905 m)     GEN- The patient is well  appearing, alert and oriented x 3 today.   Head- normocephalic, atraumatic Eyes-  Sclera clear, conjunctiva pink Ears- hearing intact Oropharynx- clear Neck- supple, no JVP Lymph- no cervical lymphadenopathy Lungs- Clear to ausculation bilaterally, normal work of breathing Heart- Regular rate and rhythm, no murmurs, rubs or gallops, PMI not laterally displaced GI- soft, NT, ND, + BS Extremities- no clubbing, cyanosis, or edema MS- no significant deformity or atrophy Skin- no rash or lesion Psych- euthymic mood, full affect Neuro- strength and sensation are intact  EKG- SB at 58 bpm, Pr int 194 ms, qrs int 103 ms, qtc 431 ms Epic records reviewed  Assessment and Plan: 1. PAF Controlled on flecainide  Continue 75 mg bid Continue metoprolol Continue warfarin, followed by [facility]  2.HTN Not well controlled, more elevated than usual Suspect salt  indiscretion   At home BP runs 140  Continue ramipril, hctz Continue  Amlodipine 10 mg a day Check BP later today and f/u with PCP if still elevated Would like to keep =/< 140/85  2. Chadsvasc score of at least 4 Continue warfarin  F/u in 4 months  Jason Alvarez, [facility] [address] [place], [place] [address] [phone]

## 2016-05-05 DIAGNOSIS — R972 Elevated prostate specific antigen [PSA]: Secondary | ICD-10-CM | POA: Diagnosis not present

## 2016-05-05 DIAGNOSIS — N401 Enlarged prostate with lower urinary tract symptoms: Secondary | ICD-10-CM | POA: Diagnosis not present

## 2016-05-12 ENCOUNTER — Ambulatory Visit (INDEPENDENT_AMBULATORY_CARE_PROVIDER_SITE_OTHER): Payer: Medicare HMO | Admitting: Pharmacist Clinician (PhC)/ Clinical Pharmacy Specialist

## 2016-05-12 ENCOUNTER — Other Ambulatory Visit (HOSPITAL_COMMUNITY)
Admission: RE | Admit: 2016-05-12 | Discharge: 2016-05-12 | Disposition: A | Discharge status: Home / Self Care | Payer: Medicare HMO | Source: Other Acute Inpatient Hospital | Attending: Urology | Admitting: Urology

## 2016-05-12 ENCOUNTER — Other Ambulatory Visit: Payer: Self-pay | Admitting: Cardiovascular Disease

## 2016-05-12 ENCOUNTER — Ambulatory Visit (INDEPENDENT_AMBULATORY_CARE_PROVIDER_SITE_OTHER): Payer: Medicare HMO | Admitting: Urology

## 2016-05-12 DIAGNOSIS — R3915 Urgency of urination: Secondary | ICD-10-CM | POA: Diagnosis not present

## 2016-05-12 DIAGNOSIS — R3121 Asymptomatic microscopic hematuria: Secondary | ICD-10-CM

## 2016-05-12 DIAGNOSIS — I4891 Unspecified atrial fibrillation: Secondary | ICD-10-CM | POA: Diagnosis not present

## 2016-05-12 DIAGNOSIS — R972 Elevated prostate specific antigen [PSA]: Secondary | ICD-10-CM

## 2016-05-12 DIAGNOSIS — Z7901 Long term (current) use of anticoagulants: Secondary | ICD-10-CM

## 2016-05-12 DIAGNOSIS — N401 Enlarged prostate with lower urinary tract symptoms: Secondary | ICD-10-CM

## 2016-05-12 LAB — URINALYSIS, ROUTINE W REFLEX MICROSCOPIC
BILIRUBIN URINE: NEGATIVE
Glucose, UA: 100 mg/dL — AB
Ketones, ur: NEGATIVE mg/dL
LEUKOCYTES UA: NEGATIVE
NITRITE: NEGATIVE
PH: 6 (ref 5.0–8.0)
Protein, ur: NEGATIVE mg/dL
SPECIFIC GRAVITY, URINE: 1.025 (ref 1.005–1.030)

## 2016-05-12 LAB — PT WITH INR/FINGERSTICK
INR, fingerstick: 2.4 — ABNORMAL HIGH (ref 0.80–1.20)
PT, fingerstick: 28.5 seconds — ABNORMAL HIGH (ref 10.4–12.5)

## 2016-05-12 LAB — URINE MICROSCOPIC-ADD ON

## 2016-05-19 ENCOUNTER — Encounter: Payer: Self-pay | Admitting: Vascular Surgery

## 2016-05-23 ENCOUNTER — Ambulatory Visit (INDEPENDENT_AMBULATORY_CARE_PROVIDER_SITE_OTHER): Payer: Self-pay | Admitting: Vascular Surgery

## 2016-05-23 ENCOUNTER — Encounter: Payer: Medicare HMO | Admitting: Vascular Surgery

## 2016-05-23 ENCOUNTER — Encounter: Payer: Self-pay | Admitting: Vascular Surgery

## 2016-05-23 VITALS — BP 154/71 | HR 57 | Temp 97.5°F | Resp 18 | Ht 75.0 in | Wt 221.0 lb

## 2016-05-23 DIAGNOSIS — I83891 Varicose veins of right lower extremities with other complications: Secondary | ICD-10-CM

## 2016-05-23 DIAGNOSIS — I868 Varicose veins of other specified sites: Secondary | ICD-10-CM

## 2016-05-23 NOTE — Progress Notes (Signed)
Subjective:     Patient ID: Jason Alvarez, male   DOB: 06/23/1941, 75 y.o.   MRN: [id number]  HPI This 75 year old male returns 3 months post-laser ablation right great saphenous vein for painful varicosities and swelling. He states his leg feels much better than it did prior to the procedure although he does continued have some aching and throbbing discomfort in the painful varicosities in the posterior thigh and calf area. He did wear his elastic compression stockings and has tried ibuprofen without total success. He's had no chest pain dyspnea on exertion PND orthopnea or hemoptysis. He remains on Coumadin. He has chronic A. fib.  Past Medical History:  Diagnosis Date  . A-fib ([place])    paf with NSVT on MCOT, on coumadin, echo 01/31/08-nl lv, mild aortic valve sclerosis   . Aneurysm ([place])   . Anxiety   . Atrial fibrillation ([place])   . Carotid bruit    doppler 05/17/12-mild plaque with no diameter reduction  . Chronic anticoagulation   . Chronic ITP (idiopathic thrombocytopenia) ([facility]) 04/10/2012   Low grade thrombocytopenia consistent with chronic ITP not in need of therapy.   . DM (diabetes mellitus) ([place])   . H/O cardiovascular stress test 01/31/08   no ishcemia, ef 61%  . HTN (hypertension)   . Hypertension   . ITP (idiopathic thrombocytopenic purpura)   . Thrombocytopenia ([place])     Social History  Substance Use Topics  . Smoking status: Never Smoker  . Smokeless tobacco: Never Used  . Alcohol use No    Family History  Problem Relation Age of Onset  . Prostate cancer Father   . Cancer Sister     breast  . Cancer Brother     lung  . Melanoma Mother     Allergies  Allergen Reactions  . Penicillins Other (See Comments)    Childhood allergy     Current Outpatient Prescriptions:  .  ALPRAZolam (XANAX) 1 MG tablet, Take 1 mg by mouth 3 (three) times daily as needed. For anxiety, Disp: , Rfl:  .  amLODipine (NORVASC) 10 MG tablet, Take 1 tablet (10 mg total) by mouth  daily., Disp: 30 tablet, Rfl: 6 .  flecainide (TAMBOCOR) 50 MG tablet, Take 1.5 tablets (75 mg total) by mouth 2 (two) times daily., Disp: 90 tablet, Rfl: 6 .  glipiZIDE-metformin (METAGLIP) 5-500 MG per tablet, Take 1 tablet by mouth daily. , Disp: , Rfl:  .  hydrochlorothiazide (HYDRODIURIL) 25 MG tablet, Take 25 mg by mouth daily.  , Disp: , Rfl:  .  HYDROcodone-acetaminophen (VICODIN) 5-500 MG per tablet, Take 1 tablet by mouth every 8 (eight) hours as needed. For pain, Disp: , Rfl:  .  metoprolol succinate (TOPROL-XL) 50 MG 24 hr tablet, Take 1/2 tablet (25mg ) once a day, Disp: 45 tablet, Rfl: 0 .  ramipril (ALTACE) 10 MG capsule, Take 10 mg by mouth 2 (two) times daily., Disp: , Rfl:  .  warfarin (COUMADIN) 5 MG tablet, Take 1-1.5 tablets by mouth daily as directed by coumadin clinic, Disp: 40 tablet, Rfl: 5  Vitals:   05/23/16 1055 05/23/16 1056  BP: (!) 156/72 (!) 154/71  Pulse: (!) 57   Resp: 18   Temp: 97.5 F (36.4 C)   TempSrc: Oral   SpO2: 100%   Weight: 221 lb (100.2 kg)   Height: 6\' 3"  (1.905 m)     Body mass index is 27.62 kg/m.         Review of Systems Patient  has history of idiopathic thrombocytopenic purpura and is on chronic Coumadin. Has had no thrombotic episodes. Denies chest pain or dyspnea on exertion.    Objective:   Physical Exam BP (!) 154/71 (BP Location: Left Arm) Comment: recheck  Pulse (!) 57   Temp 97.5 F (36.4 C) (Oral)   Resp 18   Ht 6\' 3"  (1.905 m)   Wt 221 lb (100.2 kg)   SpO2 100%   BMI 27.62 kg/m   Gen. well-developed well-nourished male no apparent distress alert and oriented 3 Lungs no rhonchi or wheezing Right leg with bulging varicosities in the mid posterior thigh extending down the popliteal fossa and in the medial calf extending anteriorly toward the tibia. Chronic hyperpigmentation lower third of right leg. 1+ edema present. 2+ dorsalis pedis pulse palpable.       Assessment:     Residual painful varicosities  following successful laser ablation right great saphenous vein continuing to cause aching throbbing and burning discomfort Idiopathic thrombus cytopenia purpura Chronic A. fib on Coumadin therapy    Plan:     Patient would like to proceed with stab phlebectomy of residual painful varicosities right leg We will proceed with precertification and once approved will hold Coumadin for 4 days preoperatively and then resume post procedure Hopefully this will relieve his residual symptoms.

## 2016-05-24 ENCOUNTER — Other Ambulatory Visit: Payer: Self-pay | Admitting: Cardiovascular Disease

## 2016-06-12 ENCOUNTER — Ambulatory Visit (INDEPENDENT_AMBULATORY_CARE_PROVIDER_SITE_OTHER): Payer: Medicare HMO | Admitting: Pharmacist Clinician (PhC)/ Clinical Pharmacy Specialist

## 2016-06-12 ENCOUNTER — Other Ambulatory Visit: Payer: Self-pay | Admitting: Cardiovascular Disease

## 2016-06-12 DIAGNOSIS — Z7901 Long term (current) use of anticoagulants: Secondary | ICD-10-CM | POA: Diagnosis not present

## 2016-06-12 DIAGNOSIS — I4891 Unspecified atrial fibrillation: Secondary | ICD-10-CM | POA: Diagnosis not present

## 2016-06-12 LAB — PROTIME-INR: INR: 3.5 — AB (ref 0.9–1.1)

## 2016-06-12 LAB — PT WITH INR/FINGERSTICK
INR FINGERSTICK: 3.5 — AB (ref 0.9–1.1)
PT, fingerstick: 42.2 seconds — ABNORMAL HIGH (ref 10.5–13.1)

## 2016-06-21 DIAGNOSIS — E119 Type 2 diabetes mellitus without complications: Secondary | ICD-10-CM | POA: Diagnosis not present

## 2016-06-21 DIAGNOSIS — H524 Presbyopia: Secondary | ICD-10-CM | POA: Diagnosis not present

## 2016-06-21 DIAGNOSIS — E1165 Type 2 diabetes mellitus with hyperglycemia: Secondary | ICD-10-CM | POA: Diagnosis not present

## 2016-06-21 DIAGNOSIS — Z961 Presence of intraocular lens: Secondary | ICD-10-CM | POA: Diagnosis not present

## 2016-06-28 ENCOUNTER — Other Ambulatory Visit (HOSPITAL_COMMUNITY): Payer: Self-pay | Admitting: Nurse Practitioner

## 2016-06-28 ENCOUNTER — Other Ambulatory Visit: Payer: Self-pay | Admitting: Cardiovascular Disease

## 2016-06-28 ENCOUNTER — Ambulatory Visit (INDEPENDENT_AMBULATORY_CARE_PROVIDER_SITE_OTHER): Payer: Medicare HMO | Admitting: Cardiovascular Disease

## 2016-06-28 DIAGNOSIS — R69 Illness, unspecified: Secondary | ICD-10-CM | POA: Diagnosis not present

## 2016-06-28 DIAGNOSIS — Z7901 Long term (current) use of anticoagulants: Secondary | ICD-10-CM | POA: Diagnosis not present

## 2016-06-28 DIAGNOSIS — I4891 Unspecified atrial fibrillation: Secondary | ICD-10-CM | POA: Diagnosis not present

## 2016-06-28 LAB — PT WITH INR/FINGERSTICK
INR FINGERSTICK: 2.2 — AB (ref 0.9–1.1)
PT FINGERSTICK: 26.3 s — AB (ref 10.5–13.1)

## 2016-06-28 LAB — PROTIME-INR: INR: 2.2 — AB (ref <=1.1)

## 2016-07-06 ENCOUNTER — Encounter: Payer: Self-pay | Admitting: Vascular Surgery

## 2016-07-11 ENCOUNTER — Encounter: Payer: Self-pay | Admitting: Vascular Surgery

## 2016-07-11 ENCOUNTER — Ambulatory Visit (INDEPENDENT_AMBULATORY_CARE_PROVIDER_SITE_OTHER): Payer: Medicare HMO | Admitting: Vascular Surgery

## 2016-07-11 VITALS — BP 150/82 | HR 84 | Resp 16 | Ht 75.0 in | Wt 212.0 lb

## 2016-07-11 DIAGNOSIS — I83891 Varicose veins of right lower extremities with other complications: Secondary | ICD-10-CM

## 2016-07-11 NOTE — Progress Notes (Signed)
    Stab Phlebectomy Procedure  DORIN XIE DOB:[birth year]  07/11/2016  Consent signed: Yes  Surgeon:J.D. Kellie Simmering  Procedure: stab phlebectomy: right leg  BP (!) 150/82   Pulse 84   Resp 16   Ht 6\' 3"  (1.905 m)   Wt 212 lb (96.2 kg)   SpO2 99%   BMI 26.50 kg/m   Start time: 9am   End time: 10:15   Tumescent Anesthesia: 200 cc 0.9% NaCl with 50 cc Lidocaine HCL with 1% Epi and 15 cc 8.4% NaHCO3  Local Anesthesia: 7 cc Lidocaine HCL and NaHCO3 (ratio 2:1)    Stab Phlebectomy: >20 Sites: Thigh and Calf  Patient tolerated procedure well: Yes  Notes:   Description of Procedure:  After marking the course of the secondary varicosities, the patient was placed on the operating table in the prone position, and the right leg was prepped and draped in sterile fashion.    The patient was then put into Trendelenburg position.  Local anesthetic was administered at the previously marked varicosities, and tumescent anesthesia was administered around the vessels.  Greater than 20 stab wounds were made using the tip of an 11 blade. And using the vein hook, the phlebectomies were performed using a hemostat to avulse the varicosities.  Adequate hemostasis was achieved, and steri strips were applied to the stab wound.      ABD pads and thigh high compression stockings were applied as well ace wraps where needed. Blood loss was less than 15 cc.  The patient ambulated out of the operating room having tolerated the procedure well.

## 2016-07-11 NOTE — Progress Notes (Signed)
Subjective:     Patient ID: Jason Alvarez, male   DOB: [birth year], 75 y.o.   MRN: [id number]  HPI This 75 year old male had multiple stab phlebectomy of painful varicosities of the right leg in the posterior thigh and pretibial region performed under local tumescent anesthesia. Greater than 20 stab phlebectomy's were performed. He tolerated the procedure well.  Review of Systems     Objective:   Physical Exam BP (!) 150/82   Pulse 84   Resp 16   Ht 6\' 3"  (1.905 m)   Wt 212 lb (96.2 kg)   SpO2 99%   BMI 26.50 kg/m        Assessment:     Well-tolerated multiple stab phlebectomy painful varicosities right leg-greater than 20 performed under local tumescent anesthesia    Plan:     Return in 3 months for final follow-up

## 2016-07-12 ENCOUNTER — Encounter: Payer: Self-pay | Admitting: Vascular Surgery

## 2016-07-18 ENCOUNTER — Telehealth (HOSPITAL_COMMUNITY): Payer: Self-pay | Admitting: *Deleted

## 2016-07-18 NOTE — Telephone Encounter (Signed)
Pt was advised to continue medications as directed and follow up with afib clinic this Thursday.  Appt was made. Pt also instructed to only take extra 1/2 tab of metoprolol if BP continued to be elevated and HR was elevated as well.  If HR returns to normal he does not need extra dose.  Pt understood

## 2016-07-18 NOTE — Telephone Encounter (Signed)
Pt cld for advice since he reports that he has been going in and out of rhythm for the last 2 days.  He reports that he took 1/2 tab of Metoprolol XL 50 mg, his regular dose of flecainide and wanted to know what [name], NPs recommendation was.  Pt was off of his coumadin for 4 days for a venous procedure.   He just restarted 07/12/2016.  Please advise.  Pt BP at 3:56 today was 140/76 and HR was 109 as reported by patient.

## 2016-07-20 ENCOUNTER — Inpatient Hospital Stay (HOSPITAL_COMMUNITY): Admission: RE | Admit: 2016-07-20 | Payer: Medicare HMO | Source: Ambulatory Visit | Admitting: Nurse Practitioner

## 2016-07-20 ENCOUNTER — Telehealth (HOSPITAL_COMMUNITY): Payer: Self-pay | Admitting: *Deleted

## 2016-07-20 NOTE — Telephone Encounter (Signed)
Patient called back in stating he returned to NSR yesterday afternoon. Feeling much better. Will call if further issues/concerns.

## 2016-07-25 ENCOUNTER — Other Ambulatory Visit: Payer: Self-pay | Admitting: Cardiovascular Disease

## 2016-07-25 DIAGNOSIS — I4891 Unspecified atrial fibrillation: Secondary | ICD-10-CM | POA: Diagnosis not present

## 2016-07-25 DIAGNOSIS — Z7901 Long term (current) use of anticoagulants: Secondary | ICD-10-CM | POA: Diagnosis not present

## 2016-07-25 LAB — PT WITH INR/FINGERSTICK
INR, fingerstick: 2.5 — ABNORMAL HIGH (ref 0.9–1.1)
PT, fingerstick: 29.7 seconds — ABNORMAL HIGH (ref 10.5–13.1)

## 2016-07-26 ENCOUNTER — Ambulatory Visit (INDEPENDENT_AMBULATORY_CARE_PROVIDER_SITE_OTHER): Payer: Medicare HMO | Admitting: Pharmacist Clinician (PhC)/ Clinical Pharmacy Specialist

## 2016-08-29 ENCOUNTER — Telehealth: Payer: Self-pay | Admitting: Cardiovascular Disease

## 2016-08-29 DIAGNOSIS — Z7901 Long term (current) use of anticoagulants: Secondary | ICD-10-CM

## 2016-08-29 DIAGNOSIS — Z5181 Encounter for therapeutic drug level monitoring: Secondary | ICD-10-CM

## 2016-08-29 NOTE — Telephone Encounter (Signed)
Spoke to patient. Needed new standing order for his PT INR. I put this in for 12 instances, 4 week autorelease. To [facility].  Pt voiced thanks, aware he should be able to go and get repeat check today.

## 2016-08-29 NOTE — Telephone Encounter (Signed)
New message    Pt states that [facility] needs order for labs. [facility] in [place].

## 2016-08-30 ENCOUNTER — Other Ambulatory Visit: Payer: Self-pay | Admitting: Cardiovascular Disease

## 2016-08-30 DIAGNOSIS — Z7901 Long term (current) use of anticoagulants: Secondary | ICD-10-CM | POA: Diagnosis not present

## 2016-08-30 DIAGNOSIS — I4891 Unspecified atrial fibrillation: Secondary | ICD-10-CM | POA: Diagnosis not present

## 2016-08-30 DIAGNOSIS — R972 Elevated prostate specific antigen [PSA]: Secondary | ICD-10-CM | POA: Diagnosis not present

## 2016-08-30 LAB — PT WITH INR/FINGERSTICK
INR, fingerstick: 2.4 — ABNORMAL HIGH (ref 0.9–1.1)
PT FINGERSTICK: 28.3 s — AB (ref 10.5–13.1)

## 2016-08-31 ENCOUNTER — Ambulatory Visit (INDEPENDENT_AMBULATORY_CARE_PROVIDER_SITE_OTHER): Payer: Medicare HMO | Admitting: Pharmacist Clinician (PhC)/ Clinical Pharmacy Specialist

## 2016-09-04 ENCOUNTER — Encounter (HOSPITAL_COMMUNITY): Payer: Self-pay | Admitting: Nurse Practitioner

## 2016-09-04 ENCOUNTER — Ambulatory Visit (HOSPITAL_COMMUNITY)
Admission: RE | Admit: 2016-09-04 | Discharge: 2016-09-04 | Disposition: A | Discharge status: Home / Self Care | Payer: Medicare HMO | Source: Ambulatory Visit | Attending: Nurse Practitioner | Admitting: Nurse Practitioner

## 2016-09-04 VITALS — BP 152/64 | HR 59 | Ht 75.0 in | Wt 225.0 lb

## 2016-09-04 DIAGNOSIS — I10 Essential (primary) hypertension: Secondary | ICD-10-CM | POA: Diagnosis not present

## 2016-09-04 DIAGNOSIS — F419 Anxiety disorder, unspecified: Secondary | ICD-10-CM | POA: Diagnosis not present

## 2016-09-04 DIAGNOSIS — E119 Type 2 diabetes mellitus without complications: Secondary | ICD-10-CM | POA: Diagnosis not present

## 2016-09-04 DIAGNOSIS — D693 Immune thrombocytopenic purpura: Secondary | ICD-10-CM | POA: Diagnosis not present

## 2016-09-04 DIAGNOSIS — Z7901 Long term (current) use of anticoagulants: Secondary | ICD-10-CM | POA: Diagnosis not present

## 2016-09-04 DIAGNOSIS — I480 Paroxysmal atrial fibrillation: Secondary | ICD-10-CM | POA: Diagnosis not present

## 2016-09-04 DIAGNOSIS — R69 Illness, unspecified: Secondary | ICD-10-CM | POA: Diagnosis not present

## 2016-09-04 LAB — BASIC METABOLIC PANEL
ANION GAP: 9 (ref 5–15)
BUN: 23 mg/dL — AB (ref 6–20)
CHLORIDE: 102 mmol/L (ref 101–111)
CO2: 28 mmol/L (ref 22–32)
Calcium: 9.3 mg/dL (ref 8.9–10.3)
Creatinine, Ser: 0.81 mg/dL (ref 0.61–1.24)
GFR calc Af Amer: >60 mL/min (ref >60)
Glucose, Bld: 143 mg/dL — ABNORMAL HIGH (ref 65–99)
POTASSIUM: 4 mmol/L (ref 3.5–5.1)
SODIUM: 139 mmol/L (ref 135–145)

## 2016-09-04 MED ORDER — FLECAINIDE ACETATE 100 MG PO TABS: 100.0000 mg | ORAL_TABLET | Freq: Two times a day (BID) | ORAL | 3 refills | Status: DC

## 2016-09-04 NOTE — Patient Instructions (Signed)
Your physician has recommended you make the following change in your medication:  1)Increase Flecainide to 100mg  twice a day

## 2016-09-04 NOTE — Progress Notes (Signed)
Patient ID: Jason Alvarez, male   DOB: 12-29-1940, 76 y.o.   MRN: [id number]     Primary Care Physician: Jason Alvarez., MD Referring Physician: Dr. Charlyne Alvarez is a 76 y.o. male with a h/o PAF on flecainide. He has noted increase in afib burden but states has been under more stress lately. Continues on warfarin.   Today, he denies symptoms of palpitations, chest pain, shortness of breath, orthopnea, PND, lower extremity edema, dizziness, presyncope, syncope, or neurologic sequela. The patient is tolerating medications without difficulties and is otherwise without complaint today.   Past Medical History:  Diagnosis Date  . A-fib ([place])    paf with NSVT on MCOT, on coumadin, echo 01/31/08-nl lv, mild aortic valve sclerosis   . Aneurysm ([place])   . Anxiety   . Atrial fibrillation ([place])   . Carotid bruit    doppler 05/17/12-mild plaque with no diameter reduction  . Chronic anticoagulation   . Chronic ITP (idiopathic thrombocytopenia) ([facility]) 04/10/2012   Low grade thrombocytopenia consistent with chronic ITP not in need of therapy.   . DM (diabetes mellitus) ([place])   . H/O cardiovascular stress test 01/31/08   no ishcemia, ef 61%  . HTN (hypertension)   . Hypertension   . ITP (idiopathic thrombocytopenic purpura)   . Thrombocytopenia ([place])    Past Surgical History:  Procedure Laterality Date  . CIRCUMCISION    . COLONOSCOPY  06/25/2012   Procedure: COLONOSCOPY;  Surgeon: Jason So, MD;  Location: [facility];  Service: Gastroenterology;  Laterality: N/A;  . KNEE ARTHROSCOPY     right and left knee/1992 and 2000  . VASECTOMY      Current Outpatient Prescriptions  Medication Sig Dispense Refill  . ALPRAZolam (XANAX) 1 MG tablet Take 1 mg by mouth 3 (three) times daily as needed. For anxiety    . amLODipine (NORVASC) 10 MG tablet Take 1 tablet (10 mg total) by mouth daily. 30 tablet 6  . flecainide (TAMBOCOR) 100 MG tablet Take 1 tablet (100 mg total) by mouth 2  (two) times daily. 60 tablet 3  . glipiZIDE-metformin (METAGLIP) 5-500 MG per tablet Take 1 tablet by mouth daily.     . hydrochlorothiazide (HYDRODIURIL) 25 MG tablet Take 25 mg by mouth daily.      Marland Kitchen HYDROcodone-acetaminophen (VICODIN) 5-500 MG per tablet Take 1 tablet by mouth every 8 (eight) hours as needed. For pain    . metoprolol succinate (TOPROL-XL) 50 MG 24 hr tablet Take 1/2 tablet (25mg ) once a day 45 tablet 0  . ramipril (ALTACE) 10 MG capsule Take 10 mg by mouth 2 (two) times daily.    Marland Kitchen warfarin (COUMADIN) 5 MG tablet Take 1-1.5 tablets by mouth daily as directed by coumadin clinic 40 tablet 5   No current facility-administered medications for this encounter.     Allergies  Allergen Reactions  . Penicillins Other (See Comments)    Childhood allergy    Social History   Social History  . Marital status: Married    Spouse name: N/A  . Number of children: N/A  . Years of education: N/A   Occupational History  . Not on file.   Social History Main Topics  . Smoking status: Never Smoker  . Smokeless tobacco: Never Used  . Alcohol use No  . Drug use: No  . Sexual activity: Not on file   Other Topics Concern  . Not on file   Social History Narrative  .  No narrative on file    Family History  Problem Relation Age of Onset  . Prostate cancer Father   . Cancer Sister     breast  . Cancer Brother     lung  . Melanoma Mother     ROS- All systems are reviewed and negative except as per the HPI above  Physical Exam: Vitals:   09/04/16 0834  BP: (!) 152/64  Pulse: (!) 59  Weight: 225 lb (102.1 kg)  Height: 6\' 3"  (1.905 m)    GEN- The patient is well appearing, alert and oriented x 3 today.   Head- normocephalic, atraumatic Eyes-  Sclera clear, conjunctiva pink Ears- hearing intact Oropharynx- clear Neck- supple, no JVP Lymph- no cervical lymphadenopathy Lungs- Clear to ausculation bilaterally, normal work of breathing Heart- Regular rate and  rhythm, no murmurs, rubs or gallops, PMI not laterally displaced GI- soft, NT, ND, + BS Extremities- no clubbing, cyanosis, or edema MS- no significant deformity or atrophy Skin- no rash or lesion Psych- euthymic mood, full affect Neuro- strength and sensation are intact  EKG- SB at 59 bpm, Pr int 208 ms,  qrs int 108 ms, qtc 453 ms Epic records reviewed  Assessment and Plan: 1. PAF Recent increase in burden  Will increase flecainide dose to 100 mg bid Continue metoprolol Continue warfarin, followed by [facility] today  2.HTN Not optimal Suspect salt  indiscretion   At home BP runs 140  Continue ramipril, hctz Continue  Amlodipine 10 mg a day  3. Chadsvasc score of at least 4 Continue warfarin  F/u in one week for EKG  Jason Alvarez, [facility] [address] [place], [place] [address] [phone]

## 2016-09-08 ENCOUNTER — Ambulatory Visit (INDEPENDENT_AMBULATORY_CARE_PROVIDER_SITE_OTHER): Payer: Medicare HMO | Admitting: Urology

## 2016-09-08 DIAGNOSIS — R3915 Urgency of urination: Secondary | ICD-10-CM | POA: Diagnosis not present

## 2016-09-08 DIAGNOSIS — R972 Elevated prostate specific antigen [PSA]: Secondary | ICD-10-CM

## 2016-09-08 DIAGNOSIS — N401 Enlarged prostate with lower urinary tract symptoms: Secondary | ICD-10-CM

## 2016-09-08 NOTE — Addendum Note (Signed)
Encounter addended by: Sherran Needs, NP on: 09/08/2016  1:43 PM<BR>    Actions taken: LOS modified

## 2016-09-11 ENCOUNTER — Encounter (HOSPITAL_COMMUNITY): Payer: Self-pay | Admitting: Nurse Practitioner

## 2016-09-11 ENCOUNTER — Ambulatory Visit (HOSPITAL_COMMUNITY)
Admission: RE | Admit: 2016-09-11 | Discharge: 2016-09-11 | Disposition: A | Discharge status: Home / Self Care | Payer: Medicare HMO | Source: Ambulatory Visit | Attending: Nurse Practitioner | Admitting: Nurse Practitioner

## 2016-09-11 DIAGNOSIS — I4891 Unspecified atrial fibrillation: Secondary | ICD-10-CM | POA: Diagnosis not present

## 2016-09-11 MED ORDER — FLECAINIDE ACETATE 50 MG PO TABS: 75.0000 mg | ORAL_TABLET | Freq: Two times a day (BID) | ORAL | 3 refills | Status: DC

## 2016-09-11 MED ORDER — METOPROLOL SUCCINATE ER 50 MG PO TB24: 50.0000 mg | ORAL_TABLET | Freq: Every day | ORAL | 6 refills | Status: DC

## 2016-09-11 NOTE — Patient Instructions (Signed)
Your physician has recommended you make the following change in your medication:   1)Metoprolol 50mg  once a day  2)Flecainide 75mg  twice a day

## 2016-09-11 NOTE — Progress Notes (Signed)
Pt in for repeat EKG today after increase of flecainide at last visit.  Pt HR today 118 BPM.  Pt reported "jumping in and out" in the last few days.  BP 152/74  Pt feels like he has had more afib with increase of flecainde 100 mg bid. He gets good response with taking extra metoprolol to get back in rhythm. He seems nervous re higher dose of flecainide. Qtc is prolonged but he is in afib today with v rate of 118 bpm, qrs int 118 ms, qtc 510 ms. Reduce flecainide back to 75 mg bid and increase BB to 50 mg of metoprolol succinate a day. F/u in 3 months, sooner if issues.

## 2016-09-27 ENCOUNTER — Ambulatory Visit: Payer: Self-pay | Admitting: Pharmacist Clinician (PhC)/ Clinical Pharmacy Specialist

## 2016-09-27 ENCOUNTER — Other Ambulatory Visit: Payer: Self-pay | Admitting: Cardiovascular Disease

## 2016-09-27 DIAGNOSIS — I4891 Unspecified atrial fibrillation: Secondary | ICD-10-CM | POA: Diagnosis not present

## 2016-09-27 DIAGNOSIS — Z7901 Long term (current) use of anticoagulants: Secondary | ICD-10-CM | POA: Diagnosis not present

## 2016-09-27 LAB — PT WITH INR/FINGERSTICK
INR FINGERSTICK: 3.8 — AB (ref 0.9–1.1)
PT FINGERSTICK: 45.1 s — AB (ref 10.5–13.1)

## 2016-09-28 ENCOUNTER — Encounter: Payer: Self-pay | Admitting: Vascular Surgery

## 2016-10-09 ENCOUNTER — Other Ambulatory Visit: Payer: Self-pay | Admitting: Cardiovascular Disease

## 2016-10-09 ENCOUNTER — Ambulatory Visit (INDEPENDENT_AMBULATORY_CARE_PROVIDER_SITE_OTHER): Payer: Medicare HMO | Admitting: Pharmacist Clinician (PhC)/ Clinical Pharmacy Specialist

## 2016-10-09 DIAGNOSIS — Z0000 Encounter for general adult medical examination without abnormal findings: Secondary | ICD-10-CM | POA: Diagnosis not present

## 2016-10-09 DIAGNOSIS — E663 Overweight: Secondary | ICD-10-CM | POA: Diagnosis not present

## 2016-10-09 DIAGNOSIS — N400 Enlarged prostate without lower urinary tract symptoms: Secondary | ICD-10-CM | POA: Diagnosis not present

## 2016-10-09 DIAGNOSIS — Z7901 Long term (current) use of anticoagulants: Secondary | ICD-10-CM | POA: Diagnosis not present

## 2016-10-09 DIAGNOSIS — I4891 Unspecified atrial fibrillation: Secondary | ICD-10-CM | POA: Diagnosis not present

## 2016-10-09 DIAGNOSIS — Z6827 Body mass index (BMI) 27.0-27.9, adult: Secondary | ICD-10-CM | POA: Diagnosis not present

## 2016-10-09 DIAGNOSIS — E1140 Type 2 diabetes mellitus with diabetic neuropathy, unspecified: Secondary | ICD-10-CM | POA: Diagnosis not present

## 2016-10-09 DIAGNOSIS — D693 Immune thrombocytopenic purpura: Secondary | ICD-10-CM | POA: Diagnosis not present

## 2016-10-09 DIAGNOSIS — G894 Chronic pain syndrome: Secondary | ICD-10-CM | POA: Diagnosis not present

## 2016-10-09 DIAGNOSIS — Z79899 Other long term (current) drug therapy: Secondary | ICD-10-CM | POA: Diagnosis not present

## 2016-10-09 DIAGNOSIS — Z1389 Encounter for screening for other disorder: Secondary | ICD-10-CM | POA: Diagnosis not present

## 2016-10-09 LAB — PT WITH INR/FINGERSTICK
INR, fingerstick: 3 — ABNORMAL HIGH (ref 0.9–1.1)
PT FINGERSTICK: 36.4 s — AB (ref 10.5–13.1)

## 2016-10-09 LAB — POCT INR: INR: 3

## 2016-10-10 ENCOUNTER — Encounter: Payer: Self-pay | Admitting: Vascular Surgery

## 2016-10-10 ENCOUNTER — Ambulatory Visit (INDEPENDENT_AMBULATORY_CARE_PROVIDER_SITE_OTHER): Payer: Self-pay | Admitting: Vascular Surgery

## 2016-10-10 VITALS — BP 136/65 | HR 55 | Ht 75.0 in | Wt 220.0 lb

## 2016-10-10 DIAGNOSIS — I83891 Varicose veins of right lower extremities with other complications: Secondary | ICD-10-CM

## 2016-10-10 NOTE — Progress Notes (Signed)
Subjective:     Patient ID: Jason Alvarez, male   DOB: 13-Dec-1940, 76 y.o.   MRN: [id number]  HPI [age] year old male returns for final follow-up regarding his stab phlebectomy of right lower extremity following laser ablation of great saphenous vein. He states his leg feels much better than it did prior to the procedure. He has had no distal edema. He's had no aching or throbbing discomfort in the medial thigh or calf. He has no complaints. He is not wearing elastic compression stocking any longer.  Past Medical History:  Diagnosis Date  . A-fib ([place])    paf with NSVT on MCOT, on coumadin, echo 01/31/08-nl lv, mild aortic valve sclerosis   . Aneurysm ([place])   . Anxiety   . Atrial fibrillation ([place])   . Carotid bruit    doppler 05/17/12-mild plaque with no diameter reduction  . Chronic anticoagulation   . Chronic ITP (idiopathic thrombocytopenia) ([facility]) 04/10/2012   Low grade thrombocytopenia consistent with chronic ITP not in need of therapy.   . DM (diabetes mellitus) ([place])   . H/O cardiovascular stress test 01/31/08   no ishcemia, ef 61%  . HTN (hypertension)   . Hypertension   . ITP (idiopathic thrombocytopenic purpura)   . Thrombocytopenia ([place])     Social History  Substance Use Topics  . Smoking status: Never Smoker  . Smokeless tobacco: Never Used  . Alcohol use No    Family History  Problem Relation Age of Onset  . Prostate cancer Father   . Cancer Sister     breast  . Cancer Brother     lung  . Melanoma Mother     Allergies  Allergen Reactions  . Penicillins Other (See Comments)    Childhood allergy     Current Outpatient Prescriptions:  .  ALPRAZolam (XANAX) 1 MG tablet, Take 1 mg by mouth 3 (three) times daily as needed. For anxiety, Disp: , Rfl:  .  amLODipine (NORVASC) 10 MG tablet, Take 1 tablet (10 mg total) by mouth daily., Disp: 30 tablet, Rfl: 6 .  flecainide (TAMBOCOR) 100 MG tablet, , Disp: , Rfl:  .  glipiZIDE-metformin (METAGLIP) 5-500 MG per  tablet, Take 1 tablet by mouth daily. , Disp: , Rfl:  .  hydrochlorothiazide (HYDRODIURIL) 25 MG tablet, Take 25 mg by mouth daily.  , Disp: , Rfl:  .  HYDROcodone-acetaminophen (VICODIN) 5-500 MG per tablet, Take 1 tablet by mouth every 8 (eight) hours as needed. For pain, Disp: , Rfl:  .  metoprolol succinate (TOPROL-XL) 50 MG 24 hr tablet, Take 1 tablet (50 mg total) by mouth daily., Disp: 30 tablet, Rfl: 6 .  ramipril (ALTACE) 10 MG capsule, Take 10 mg by mouth 2 (two) times daily., Disp: , Rfl:  .  warfarin (COUMADIN) 5 MG tablet, Take 1-1.5 tablets by mouth daily as directed by coumadin clinic, Disp: 40 tablet, Rfl: 5  Vitals:   10/10/16 1040  BP: 136/65  Pulse: (!) 55  Weight: 220 lb (99.8 kg)  Height: 6\' 3"  (1.905 m)    Body mass index is 27.5 kg/m.         Review of Systems Denies chest pain or dyspnea on exertion    Objective:   Physical Exam BP 136/65 Comment: Left arm sitting  Pulse (!) 55   Ht 6\' 3"  (1.905 m)   Wt 220 lb (99.8 kg)   BMI 27.50 kg/m   In a well-developed well-nourished male no apparent distress alert and oriented  3 Right leg with no distal edema noted. Chronic hyperpigmentation but no active ulcer. 3+ dorsalis pedis pulse palpable. No bulging varicosities noted.    Assessment:     Good result following laser ablation right great saphenous vein followed by stab phlebectomy of painful varicosities    Plan:     Return to see me on a when necessary basis

## 2016-11-06 ENCOUNTER — Other Ambulatory Visit: Payer: Self-pay | Admitting: Cardiovascular Disease

## 2016-11-06 ENCOUNTER — Ambulatory Visit (INDEPENDENT_AMBULATORY_CARE_PROVIDER_SITE_OTHER): Payer: Medicare HMO | Admitting: Pharmacist Clinician (PhC)/ Clinical Pharmacy Specialist

## 2016-11-06 DIAGNOSIS — I4891 Unspecified atrial fibrillation: Secondary | ICD-10-CM | POA: Diagnosis not present

## 2016-11-06 DIAGNOSIS — Z7901 Long term (current) use of anticoagulants: Secondary | ICD-10-CM | POA: Diagnosis not present

## 2016-11-06 LAB — PT WITH INR/FINGERSTICK
INR FINGERSTICK: 3.3 — AB (ref 0.9–1.1)
PT, fingerstick: 39.6 seconds — ABNORMAL HIGH (ref 10.5–13.1)

## 2016-11-06 LAB — POCT INR: INR: 3.3

## 2016-11-09 DIAGNOSIS — D225 Melanocytic nevi of trunk: Secondary | ICD-10-CM | POA: Diagnosis not present

## 2016-11-09 DIAGNOSIS — C44311 Basal cell carcinoma of skin of nose: Secondary | ICD-10-CM | POA: Diagnosis not present

## 2016-11-09 DIAGNOSIS — X32XXXD Exposure to sunlight, subsequent encounter: Secondary | ICD-10-CM | POA: Diagnosis not present

## 2016-11-09 DIAGNOSIS — L570 Actinic keratosis: Secondary | ICD-10-CM | POA: Diagnosis not present

## 2016-11-20 ENCOUNTER — Ambulatory Visit (INDEPENDENT_AMBULATORY_CARE_PROVIDER_SITE_OTHER): Payer: Self-pay | Admitting: Pharmacist

## 2016-11-20 ENCOUNTER — Other Ambulatory Visit: Payer: Self-pay | Admitting: Cardiovascular Disease

## 2016-11-20 DIAGNOSIS — Z7901 Long term (current) use of anticoagulants: Secondary | ICD-10-CM

## 2016-11-20 DIAGNOSIS — I4891 Unspecified atrial fibrillation: Secondary | ICD-10-CM | POA: Diagnosis not present

## 2016-11-20 LAB — PT WITH INR/FINGERSTICK
INR, fingerstick: 2.6 — ABNORMAL HIGH (ref 0.9–1.1)
PT FINGERSTICK: 31.7 s — AB (ref 10.5–13.1)

## 2016-12-05 ENCOUNTER — Encounter (HOSPITAL_COMMUNITY): Payer: Self-pay | Admitting: Nurse Practitioner

## 2016-12-05 ENCOUNTER — Ambulatory Visit (HOSPITAL_COMMUNITY)
Admission: RE | Admit: 2016-12-05 | Discharge: 2016-12-05 | Disposition: A | Discharge status: Home / Self Care | Payer: Medicare HMO | Source: Ambulatory Visit | Attending: Nurse Practitioner | Admitting: Nurse Practitioner

## 2016-12-05 VITALS — BP 186/82 | HR 53 | Ht 75.0 in | Wt 225.2 lb

## 2016-12-05 DIAGNOSIS — I440 Atrioventricular block, first degree: Secondary | ICD-10-CM | POA: Insufficient documentation

## 2016-12-05 DIAGNOSIS — F419 Anxiety disorder, unspecified: Secondary | ICD-10-CM | POA: Diagnosis not present

## 2016-12-05 DIAGNOSIS — I10 Essential (primary) hypertension: Secondary | ICD-10-CM | POA: Insufficient documentation

## 2016-12-05 DIAGNOSIS — D696 Thrombocytopenia, unspecified: Secondary | ICD-10-CM | POA: Diagnosis not present

## 2016-12-05 DIAGNOSIS — I480 Paroxysmal atrial fibrillation: Secondary | ICD-10-CM

## 2016-12-05 DIAGNOSIS — Z7901 Long term (current) use of anticoagulants: Secondary | ICD-10-CM | POA: Insufficient documentation

## 2016-12-05 DIAGNOSIS — R69 Illness, unspecified: Secondary | ICD-10-CM | POA: Diagnosis not present

## 2016-12-05 DIAGNOSIS — E119 Type 2 diabetes mellitus without complications: Secondary | ICD-10-CM | POA: Insufficient documentation

## 2016-12-05 NOTE — Progress Notes (Signed)
Patient ID: Jason Alvarez, male   DOB: 1940/11/13, 76 y.o.   MRN: [id number]     Primary Care Physician: Redmond School, MD Referring Physician: Dr. Charlyne Petrin is a 76 y.o. male with a h/o PAF on flecainide. He has noted increase in afib burden on occasion but is still happy with flecainide and does not want to change treatment at this time.  Continues on warfarin.   Today, he denies symptoms of palpitations, chest pain, shortness of breath, orthopnea, PND, lower extremity edema, dizziness, presyncope, syncope, or neurologic sequela. The patient is tolerating medications without difficulties and is otherwise without complaint today.   Past Medical History:  Diagnosis Date  . A-fib ([place])    paf with NSVT on MCOT, on coumadin, echo 01/31/08-nl lv, mild aortic valve sclerosis   . Aneurysm ([place])   . Anxiety   . Atrial fibrillation ([place])   . Carotid bruit    doppler 05/17/12-mild plaque with no diameter reduction  . Chronic anticoagulation   . Chronic ITP (idiopathic thrombocytopenia) (H[facility]) 04/10/2012   Low grade thrombocytopenia consistent with chronic ITP not in need of therapy.   . DM (diabetes mellitus) ([place])   . H/O cardiovascular stress test 01/31/08   no ishcemia, ef 61%  . HTN (hypertension)   . Hypertension   . ITP (idiopathic thrombocytopenic purpura)   . Thrombocytopenia ([place])    Past Surgical History:  Procedure Laterality Date  . CIRCUMCISION    . COLONOSCOPY  06/25/2012   Procedure: COLONOSCOPY;  Surgeon: Jamesetta So, MD;  Location: [facility];  Service: Gastroenterology;  Laterality: N/A;  . KNEE ARTHROSCOPY     right and left knee/1992 and 2000  . VASECTOMY      Current Outpatient Prescriptions  Medication Sig Dispense Refill  . ALPRAZolam (XANAX) 1 MG tablet Take 1 mg by mouth 3 (three) times daily as needed. For anxiety    . amLODipine (NORVASC) 10 MG tablet Take 1 tablet (10 mg total) by mouth daily. 30 tablet 6  . flecainide (TAMBOCOR) 100  MG tablet Take 100 mg by mouth 2 (two) times daily.     Marland Kitchen glipiZIDE-metformin (METAGLIP) 5-500 MG per tablet Take 1 tablet by mouth daily.     . hydrochlorothiazide (HYDRODIURIL) 25 MG tablet Take 25 mg by mouth daily.      Marland Kitchen HYDROcodone-acetaminophen (VICODIN) 5-500 MG per tablet Take 1 tablet by mouth every 8 (eight) hours as needed. For pain    . metoprolol succinate (TOPROL-XL) 50 MG 24 hr tablet Take 1 tablet (50 mg total) by mouth daily. (Patient taking differently: Take 25 mg by mouth 2 (two) times daily. ) 30 tablet 6  . ramipril (ALTACE) 10 MG capsule Take 10 mg by mouth 2 (two) times daily.    Marland Kitchen warfarin (COUMADIN) 5 MG tablet TAKE 1-1&1/2 TABLETS DAILY AS DIRECTED 120 tablet 0   No current facility-administered medications for this encounter.     Allergies  Allergen Reactions  . Penicillins Other (See Comments)    Childhood allergy    Social History   Social History  . Marital status: Married    Spouse name: N/A  . Number of children: N/A  . Years of education: N/A   Occupational History  . Not on file.   Social History Main Topics  . Smoking status: Never Smoker  . Smokeless tobacco: Never Used  . Alcohol use No  . Drug use: No  . Sexual activity: Not on  file   Other Topics Concern  . Not on file   Social History Narrative  . No narrative on file    Family History  Problem Relation Age of Onset  . Prostate cancer Father   . Cancer Sister        breast  . Cancer Brother        lung  . Melanoma Mother     ROS- All systems are reviewed and negative except as per the HPI above  Physical Exam: Vitals:   12/05/16 0824  BP: (!) 186/82  Pulse: (!) 53  Weight: 225 lb 3.2 oz (102.2 kg)  Height: 6\' 3"  (1.905 m)    GEN- The patient is well appearing, alert and oriented x 3 today.   Head- normocephalic, atraumatic Eyes-  Sclera clear, conjunctiva pink Ears- hearing intact Oropharynx- clear Neck- supple, no JVP Lymph- no cervical  lymphadenopathy Lungs- Clear to ausculation bilaterally, normal work of breathing Heart- Regular rate and rhythm, no murmurs, rubs or gallops, PMI not laterally displaced GI- soft, NT, ND, + BS Extremities- no clubbing, cyanosis, or edema MS- no significant deformity or atrophy Skin- no rash or lesion Psych- euthymic mood, full affect Neuro- strength and sensation are intact  EKG- Sinus brady with first degree AV block with Pr int 232 ms,qrs int 112 ms, qtc 442  Epic records reviewed  Assessment and Plan: 1. PAF Continue  Flecainide  100 mg bid Continue metoprolol Continue warfarin, followed by [facility]   2.HTN Not optimal, he states white coat hypertension States readings at home 155-208 systolic Suspect salt  indiscretion   Continue ramipril, hctz Continue  Amlodipine 10 mg a day  3. Chadsvasc score of at least 4 Continue warfarin  F/u 3 months  Butch Penny C. Janayla Marik, [facility] [address] [place], [place] [address] [phone]

## 2016-12-14 ENCOUNTER — Ambulatory Visit (INDEPENDENT_AMBULATORY_CARE_PROVIDER_SITE_OTHER): Payer: Self-pay | Admitting: Pharmacist Clinician (PhC)/ Clinical Pharmacy Specialist

## 2016-12-14 ENCOUNTER — Other Ambulatory Visit: Payer: Self-pay | Admitting: Cardiovascular Disease

## 2016-12-14 DIAGNOSIS — I4891 Unspecified atrial fibrillation: Secondary | ICD-10-CM | POA: Diagnosis not present

## 2016-12-14 DIAGNOSIS — C44311 Basal cell carcinoma of skin of nose: Secondary | ICD-10-CM | POA: Diagnosis not present

## 2016-12-14 DIAGNOSIS — Z7901 Long term (current) use of anticoagulants: Secondary | ICD-10-CM | POA: Diagnosis not present

## 2016-12-14 DIAGNOSIS — L508 Other urticaria: Secondary | ICD-10-CM | POA: Diagnosis not present

## 2016-12-14 LAB — PT WITH INR/FINGERSTICK
INR, fingerstick: 2.4 — ABNORMAL HIGH (ref 0.9–1.1)
PT, fingerstick: 28.8 seconds — ABNORMAL HIGH (ref 10.5–13.1)

## 2016-12-14 LAB — PROTIME-INR: INR: 2.4 — AB (ref <=1.1)

## 2016-12-20 ENCOUNTER — Other Ambulatory Visit (HOSPITAL_COMMUNITY): Payer: Self-pay | Admitting: Nurse Practitioner

## 2016-12-30 ENCOUNTER — Other Ambulatory Visit (HOSPITAL_COMMUNITY): Payer: Self-pay | Admitting: Nurse Practitioner

## 2017-01-12 ENCOUNTER — Ambulatory Visit (INDEPENDENT_AMBULATORY_CARE_PROVIDER_SITE_OTHER): Payer: Medicare HMO | Admitting: Pharmacist

## 2017-01-12 ENCOUNTER — Other Ambulatory Visit: Payer: Self-pay | Admitting: Cardiovascular Disease

## 2017-01-12 DIAGNOSIS — Z7901 Long term (current) use of anticoagulants: Secondary | ICD-10-CM

## 2017-01-12 DIAGNOSIS — I4891 Unspecified atrial fibrillation: Secondary | ICD-10-CM | POA: Diagnosis not present

## 2017-01-12 LAB — PT WITH INR/FINGERSTICK
INR, fingerstick: 3.2 — ABNORMAL HIGH (ref 0.9–1.1)
PT FINGERSTICK: 38.1 s — AB (ref 10.5–13.1)

## 2017-01-23 ENCOUNTER — Ambulatory Visit (INDEPENDENT_AMBULATORY_CARE_PROVIDER_SITE_OTHER): Payer: Medicare HMO | Admitting: General Surgery

## 2017-01-23 ENCOUNTER — Encounter: Payer: Self-pay | Admitting: General Surgery

## 2017-01-23 VITALS — BP 160/82 | HR 100 | Temp 97.8°F | Resp 18 | Ht 75.0 in | Wt 226.0 lb

## 2017-01-23 DIAGNOSIS — Z860100 Personal history of colon polyps, unspecified: Secondary | ICD-10-CM

## 2017-01-23 MED ORDER — PEG 3350-KCL-NABCB-NACL-NASULF 236 G PO SOLR: 4000.0000 mL | Freq: Once | ORAL | 0 refills | Status: AC

## 2017-01-23 NOTE — H&P (Signed)
Jason Alvarez; [id number]; 1940/07/22   HPI Patient is a 76 year old white male who returns for follow-up colonoscopy. In 2013, he had to tubulovillous adenomas removed from his sigmoid colon and rectum. He denies any significant GI complaints. He has had no blood in his stools. He denies any family history of colon cancer. He has no diarrhea or constipation. He currently has no pain. He is on Coumadin for chronic atrial fibrillation.     Past Medical History:  Diagnosis Date  . A-fib ([place])    paf with NSVT on MCOT, on coumadin, echo 01/31/08-nl lv, mild aortic valve sclerosis   . Aneurysm ([place])   . Anxiety   . Atrial fibrillation ([place])   . Carotid bruit    doppler 05/17/12-mild plaque with no diameter reduction  . Chronic anticoagulation   . Chronic ITP (idiopathic thrombocytopenia) ([facility]) 04/10/2012   Low grade thrombocytopenia consistent with chronic ITP not in need of therapy.   . DM (diabetes mellitus) ([place])   . H/O cardiovascular stress test 01/31/08   no ishcemia, ef 61%  . HTN (hypertension)   . Hypertension   . ITP (idiopathic thrombocytopenic purpura)   . Thrombocytopenia ([place])          Past Surgical History:  Procedure Laterality Date  . CIRCUMCISION    . COLONOSCOPY  06/25/2012   Procedure: COLONOSCOPY;  Surgeon: Jamesetta So, MD;  Location: [facility];  Service: Gastroenterology;  Laterality: N/A;  . KNEE ARTHROSCOPY     right and left knee/1992 and 2000  . VASECTOMY           Family History  Problem Relation Age of Onset  . Prostate cancer Father   . Cancer Sister        breast  . Cancer Brother        lung  . Melanoma Mother           Current Outpatient Prescriptions on File Prior to Visit  Medication Sig Dispense Refill  . ALPRAZolam (XANAX) 1 MG tablet Take 1 mg by mouth 3 (three) times daily as needed. For anxiety    . amLODipine (NORVASC) 10 MG tablet Take 1 tablet (10 mg total) by mouth daily. 30 tablet 6   . flecainide (TAMBOCOR) 100 MG tablet Take 100 mg by mouth 2 (two) times daily.     . flecainide (TAMBOCOR) 100 MG tablet TAKE ONE TABLET BY MOUTH TWICE DAILY 60 tablet 6  . glipiZIDE-metformin (METAGLIP) 5-500 MG per tablet Take 1 tablet by mouth daily.     . hydrochlorothiazide (HYDRODIURIL) 25 MG tablet Take 25 mg by mouth daily.      Marland Kitchen HYDROcodone-acetaminophen (VICODIN) 5-500 MG per tablet Take 1 tablet by mouth every 8 (eight) hours as needed. For pain    . metoprolol succinate (TOPROL-XL) 50 MG 24 hr tablet TAKE ONE (1) TABLET EACH DAY 90 tablet 2  . ramipril (ALTACE) 10 MG capsule Take 10 mg by mouth 2 (two) times daily.    Marland Kitchen warfarin (COUMADIN) 5 MG tablet TAKE 1-1&1/2 TABLETS DAILY AS DIRECTED 120 tablet 0   No current facility-administered medications on file prior to visit.          Allergies  Allergen Reactions  . Penicillins Other (See Comments)    Childhood allergy       History  Alcohol Use No       History  Smoking Status  . Never Smoker  Smokeless Tobacco  . Never Used  Review of Systems  Constitutional: Positive for malaise/fatigue.  HENT: Positive for sinus pain.   Eyes: Negative.   Respiratory: Negative.   Cardiovascular: Negative.   Gastrointestinal: Negative.   Genitourinary: Negative.   Musculoskeletal: Positive for back pain and neck pain.  Skin: Negative.   Neurological: Negative.   Endo/Heme/Allergies: Negative.   Psychiatric/Behavioral: Negative.     Objective      Vitals:   01/23/17 0929  BP: (!) 160/82  Pulse: 100  Resp: 18  Temp: 97.8 F (36.6 C)    Physical Exam  Constitutional: He is oriented to person, place, and time and well-developed, well-nourished, and in no distress.  HENT:  Head: Normocephalic and atraumatic.  Neck: Normal range of motion.  Cardiovascular:  No murmur heard. Irregular rate and rhythm.  Abdominal: Soft. Bowel sounds are normal. He exhibits no distension. There is  no tenderness. There is no guarding.  Neurological: He is alert and oriented to person, place, and time.  Skin: Skin is warm and dry.  Vitals reviewed.   Assessment   History of colon polyps, chronic atrial fibrillation with chronic anticoagulation Plan   Patient is scheduled for a surveillance colonoscopy on 01/30/2017. The risks and benefits of the procedure including bleeding and infection were fully explained to the patient, who gave informed consent. He should stop his Coumadin on 01/26/2017.

## 2017-01-23 NOTE — Progress Notes (Signed)
Jason Alvarez; [id number]; 1941/03/07   HPI Patient is a 76 year old white male who returns for follow-up colonoscopy. In 2013, he had to tubulovillous adenomas removed from his sigmoid colon and rectum. He denies any significant GI complaints. He has had no blood in his stools. He denies any family history of colon cancer. He has no diarrhea or constipation. He currently has no pain. He is on Coumadin for chronic atrial fibrillation. Past Medical History:  Diagnosis Date  . A-fib ([place])    paf with NSVT on MCOT, on coumadin, echo 01/31/08-nl lv, mild aortic valve sclerosis   . Aneurysm ([place])   . Anxiety   . Atrial fibrillation ([place])   . Carotid bruit    doppler 05/17/12-mild plaque with no diameter reduction  . Chronic anticoagulation   . Chronic ITP (idiopathic thrombocytopenia) ([facility]) 04/10/2012   Low grade thrombocytopenia consistent with chronic ITP not in need of therapy.   . DM (diabetes mellitus) ([place])   . H/O cardiovascular stress test 01/31/08   no ishcemia, ef 61%  . HTN (hypertension)   . Hypertension   . ITP (idiopathic thrombocytopenic purpura)   . Thrombocytopenia ([place])     Past Surgical History:  Procedure Laterality Date  . CIRCUMCISION    . COLONOSCOPY  06/25/2012   Procedure: COLONOSCOPY;  Surgeon: Jamesetta So, MD;  Location[facility];  Service: Gastroenterology;  Laterality: N/A;  . KNEE ARTHROSCOPY     right and left knee/1992 and 2000  . VASECTOMY      Family History  Problem Relation Age of Onset  . Prostate cancer Father   . Cancer Sister        breast  . Cancer Brother        lung  . Melanoma Mother     Current Outpatient Prescriptions on File Prior to Visit  Medication Sig Dispense Refill  . ALPRAZolam (XANAX) 1 MG tablet Take 1 mg by mouth 3 (three) times daily as needed. For anxiety    . amLODipine (NORVASC) 10 MG tablet Take 1 tablet (10 mg total) by mouth daily. 30 tablet 6  . flecainide (TAMBOCOR) 100 MG tablet Take 100 mg by mouth 2  (two) times daily.     . flecainide (TAMBOCOR) 100 MG tablet TAKE ONE TABLET BY MOUTH TWICE DAILY 60 tablet 6  . glipiZIDE-metformin (METAGLIP) 5-500 MG per tablet Take 1 tablet by mouth daily.     . hydrochlorothiazide (HYDRODIURIL) 25 MG tablet Take 25 mg by mouth daily.      Marland Kitchen HYDROcodone-acetaminophen (VICODIN) 5-500 MG per tablet Take 1 tablet by mouth every 8 (eight) hours as needed. For pain    . metoprolol succinate (TOPROL-XL) 50 MG 24 hr tablet TAKE ONE (1) TABLET EACH DAY 90 tablet 2  . ramipril (ALTACE) 10 MG capsule Take 10 mg by mouth 2 (two) times daily.    Marland Kitchen warfarin (COUMADIN) 5 MG tablet TAKE 1-1&1/2 TABLETS DAILY AS DIRECTED 120 tablet 0   No current facility-administered medications on file prior to visit.     Allergies  Allergen Reactions  . Penicillins Other (See Comments)    Childhood allergy    History  Alcohol Use No    History  Smoking Status  . Never Smoker  Smokeless Tobacco  . Never Used    Review of Systems  Constitutional: Positive for malaise/fatigue.  HENT: Positive for sinus pain.   Eyes: Negative.   Respiratory: Negative.   Cardiovascular: Negative.   Gastrointestinal: Negative.  Genitourinary: Negative.   Musculoskeletal: Positive for back pain and neck pain.  Skin: Negative.   Neurological: Negative.   Endo/Heme/Allergies: Negative.   Psychiatric/Behavioral: Negative.     Objective   Vitals:   01/23/17 0929  BP: (!) 160/82  Pulse: 100  Resp: 18  Temp: 97.8 F (36.6 C)    Physical Exam  Constitutional: He is oriented to person, place, and time and well-developed, well-nourished, and in no distress.  HENT:  Head: Normocephalic and atraumatic.  Neck: Normal range of motion.  Cardiovascular:  No murmur heard. Irregular rate and rhythm.  Abdominal: Soft. Bowel sounds are normal. He exhibits no distension. There is no tenderness. There is no guarding.  Neurological: He is alert and oriented to person, place, and time.   Skin: Skin is warm and dry.  Vitals reviewed.   Assessment   History of colon polyps, chronic atrial fibrillation with chronic anticoagulation Plan   Patient is scheduled for a surveillance colonoscopy on 01/30/2017. The risks and benefits of the procedure including bleeding and infection were fully explained to the patient, who gave informed consent. He should stop his Coumadin on 01/26/2017.

## 2017-01-23 NOTE — Patient Instructions (Signed)
Stop coumadin on Friday, 7/13   Colonoscopy, Adult A colonoscopy is an exam to look at the entire large intestine. During the exam, a lubricated, bendable tube is inserted into the anus and then passed into the rectum, colon, and other parts of the large intestine. A colonoscopy is often done as a part of normal colorectal screening or in response to certain symptoms, such as anemia, persistent diarrhea, abdominal pain, and blood in the stool. The exam can help screen for and diagnose medical problems, including:  Tumors.  Polyps.  Inflammation.  Areas of bleeding.  Tell a health care provider about:  Any allergies you have.  All medicines you are taking, including vitamins, herbs, eye drops, creams, and over-the-counter medicines.  Any problems you or family members have had with anesthetic medicines.  Any blood disorders you have.  Any surgeries you have had.  Any medical conditions you have.  Any problems you have had passing stool. What are the risks? Generally, this is a safe procedure. However, problems may occur, including:  Bleeding.  A tear in the intestine.  A reaction to medicines given during the exam.  Infection (rare).  What happens before the procedure? Eating and drinking restrictions Follow instructions from your health care provider about eating and drinking, which may include:  A few days before the procedure - follow a low-fiber diet. Avoid nuts, seeds, dried fruit, raw fruits, and vegetables.  1-3 days before the procedure - follow a clear liquid diet. Drink only clear liquids, such as clear broth or bouillon, black coffee or tea, clear juice, clear soft drinks or sports drinks, gelatin dessert, and popsicles. Avoid any liquids that contain red or purple dye.  On the day of the procedure - do not eat or drink anything during the 2 hours before the procedure, or within the time period that your health care provider recommends.  Bowel prep If  you were prescribed an oral bowel prep to clean out your colon:  Take it as told by your health care provider. Starting the day before your procedure, you will need to drink a large amount of medicated liquid. The liquid will cause you to have multiple loose stools until your stool is almost clear or light green.  If your skin or anus gets irritated from diarrhea, you may use these to relieve the irritation: ? Medicated wipes, such as adult wet wipes with aloe and vitamin E. ? A skin soothing-product like petroleum jelly.  If you vomit while drinking the bowel prep, take a break for up to 60 minutes and then begin the bowel prep again. If vomiting continues and you cannot take the bowel prep without vomiting, call your health care provider.  General instructions  Ask your health care provider about changing or stopping your regular medicines. This is especially important if you are taking diabetes medicines or blood thinners.  Plan to have someone take you home from the hospital or clinic. What happens during the procedure?  An IV tube may be inserted into one of your veins.  You will be given medicine to help you relax (sedative).  To reduce your risk of infection: ? Your health care team will wash or sanitize their hands. ? Your anal area will be washed with soap.  You will be asked to lie on your side with your knees bent.  Your health care provider will lubricate a long, thin, flexible tube. The tube will have a camera and a light on the end.  The tube will be inserted into your anus.  The tube will be gently eased through your rectum and colon.  Air will be delivered into your colon to keep it open. You may feel some pressure or cramping.  The camera will be used to take images during the procedure.  A small tissue sample may be removed from your body to be examined under a microscope (biopsy). If any potential problems are found, the tissue will be sent to a lab for  testing.  If small polyps are found, your health care provider may remove them and have them checked for cancer cells.  The tube that was inserted into your anus will be slowly removed. The procedure may vary among health care providers and hospitals. What happens after the procedure?  Your blood pressure, heart rate, breathing rate, and blood oxygen level will be monitored until the medicines you were given have worn off.  Do not drive for 24 hours after the exam.  You may have a small amount of blood in your stool.  You may pass gas and have mild abdominal cramping or bloating due to the air that was used to inflate your colon during the exam.  It is up to you to get the results of your procedure. Ask your health care provider, or the department performing the procedure, when your results will be ready. This information is not intended to replace advice given to you by your health care provider. Make sure you discuss any questions you have with your health care provider. Document Released: 06/30/2000 Document Revised: 05/03/2016 Document Reviewed: 09/14/2015 Elsevier Interactive Patient Education  2018 [employer].

## 2017-01-30 ENCOUNTER — Ambulatory Visit (HOSPITAL_COMMUNITY)
Admission: RE | Admit: 2017-01-30 | Discharge: 2017-01-30 | Disposition: A | Discharge status: Home / Self Care | Payer: Medicare HMO | Source: Ambulatory Visit | Attending: General Surgery | Admitting: General Surgery

## 2017-01-30 ENCOUNTER — Encounter (HOSPITAL_COMMUNITY): Payer: Self-pay | Admitting: *Deleted

## 2017-01-30 ENCOUNTER — Encounter (HOSPITAL_COMMUNITY)
Admission: RE | Disposition: A | Discharge status: Home / Self Care | Payer: Self-pay | Source: Ambulatory Visit | Attending: General Surgery

## 2017-01-30 DIAGNOSIS — I10 Essential (primary) hypertension: Secondary | ICD-10-CM | POA: Diagnosis not present

## 2017-01-30 DIAGNOSIS — Z79899 Other long term (current) drug therapy: Secondary | ICD-10-CM | POA: Diagnosis not present

## 2017-01-30 DIAGNOSIS — I729 Aneurysm of unspecified site: Secondary | ICD-10-CM | POA: Diagnosis not present

## 2017-01-30 DIAGNOSIS — E119 Type 2 diabetes mellitus without complications: Secondary | ICD-10-CM | POA: Insufficient documentation

## 2017-01-30 DIAGNOSIS — R0989 Other specified symptoms and signs involving the circulatory and respiratory systems: Secondary | ICD-10-CM | POA: Diagnosis not present

## 2017-01-30 DIAGNOSIS — F419 Anxiety disorder, unspecified: Secondary | ICD-10-CM | POA: Diagnosis not present

## 2017-01-30 DIAGNOSIS — K635 Polyp of colon: Secondary | ICD-10-CM

## 2017-01-30 DIAGNOSIS — Z1211 Encounter for screening for malignant neoplasm of colon: Secondary | ICD-10-CM | POA: Diagnosis not present

## 2017-01-30 DIAGNOSIS — I480 Paroxysmal atrial fibrillation: Secondary | ICD-10-CM | POA: Diagnosis not present

## 2017-01-30 DIAGNOSIS — Z7984 Long term (current) use of oral hypoglycemic drugs: Secondary | ICD-10-CM | POA: Diagnosis not present

## 2017-01-30 DIAGNOSIS — D693 Immune thrombocytopenic purpura: Secondary | ICD-10-CM | POA: Insufficient documentation

## 2017-01-30 DIAGNOSIS — Z791 Long term (current) use of non-steroidal anti-inflammatories (NSAID): Secondary | ICD-10-CM | POA: Diagnosis not present

## 2017-01-30 DIAGNOSIS — D696 Thrombocytopenia, unspecified: Secondary | ICD-10-CM | POA: Diagnosis not present

## 2017-01-30 DIAGNOSIS — Z880 Allergy status to penicillin: Secondary | ICD-10-CM | POA: Insufficient documentation

## 2017-01-30 DIAGNOSIS — Z86010 Personal history of colonic polyps: Secondary | ICD-10-CM | POA: Insufficient documentation

## 2017-01-30 DIAGNOSIS — D120 Benign neoplasm of cecum: Secondary | ICD-10-CM | POA: Insufficient documentation

## 2017-01-30 DIAGNOSIS — R69 Illness, unspecified: Secondary | ICD-10-CM | POA: Diagnosis not present

## 2017-01-30 HISTORY — DX: Personal history of urinary calculi: Z87.442

## 2017-01-30 HISTORY — DX: Cardiac arrhythmia, unspecified: I49.9

## 2017-01-30 HISTORY — PX: POLYPECTOMY: SHX5525

## 2017-01-30 HISTORY — PX: COLONOSCOPY: SHX5424

## 2017-01-30 LAB — GLUCOSE, CAPILLARY: GLUCOSE-CAPILLARY: 232 mg/dL — AB (ref 65–99)

## 2017-01-30 SURGERY — COLONOSCOPY
Anesthesia: Moderate Sedation

## 2017-01-30 MED ORDER — SODIUM CHLORIDE 0.9 % IV SOLN
INTRAVENOUS | Status: DC
  Administered 2017-01-30: 1000 mL via INTRAVENOUS

## 2017-01-30 MED ORDER — MIDAZOLAM HCL 5 MG/5ML IJ SOLN
INTRAMUSCULAR | Status: AC
  Filled 2017-01-30: qty 5

## 2017-01-30 MED ORDER — SIMETHICONE 40 MG/0.6ML PO SUSP
ORAL | Status: DC | PRN
  Administered 2017-01-30: 08:00:00

## 2017-01-30 MED ORDER — MEPERIDINE HCL 50 MG/ML IJ SOLN
INTRAMUSCULAR | Status: AC
  Filled 2017-01-30: qty 1

## 2017-01-30 MED ORDER — MIDAZOLAM HCL 5 MG/5ML IJ SOLN
INTRAMUSCULAR | Status: DC | PRN
  Administered 2017-01-30: 1 mg via INTRAVENOUS
  Administered 2017-01-30: 3 mg via INTRAVENOUS
  Administered 2017-01-30: 1 mg via INTRAVENOUS

## 2017-01-30 MED ORDER — MEPERIDINE HCL 50 MG/ML IJ SOLN
INTRAMUSCULAR | Status: DC | PRN
  Administered 2017-01-30: 50 mg via INTRAVENOUS

## 2017-01-30 NOTE — Discharge Instructions (Signed)
Colonoscopy, Adult, Care After °This sheet gives you information about how to care for yourself after your procedure. Your health care provider may also give you more specific instructions. If you have problems or questions, contact your health care provider.  Dr Jenkins [phone] °What can I expect after the procedure? °After the procedure, it is common to have: °· A small amount of blood in your stool for 24 hours after the procedure. °· Some gas. °· Mild abdominal cramping or bloating. ° °Follow these instructions at home: °General instructions ° °· For the first 24 hours after the procedure: °? Do not drive or use machinery. °? Do not sign important documents. °? Do not drink alcohol. °? Do your regular daily activities at a slower pace than normal. °? Eat soft, easy-to-digest foods. °? Rest often. °· Take over-the-counter or prescription medicines only as told by your health care provider. °· It is up to you to get the results of your procedure. Ask your health care provider, or the department performing the procedure, when your results will be ready. °Relieving cramping and bloating °· Try walking around when you have cramps or feel bloated. °· Apply heat to your abdomen as told by your health care provider. Use a heat source that your health care provider recommends, such as a moist heat pack or a heating pad. °? Place a towel between your skin and the heat source. °? Leave the heat on for 20-30 minutes. °? Remove the heat if your skin turns bright red. This is especially important if you are unable to feel pain, heat, or cold. You may have a greater risk of getting burned. °Eating and drinking °· Drink enough fluid to keep your urine clear or pale yellow. °· Resume your normal diet as instructed by your health care provider. Avoid heavy or fried foods that are hard to digest. °· Avoid drinking alcohol for as long as instructed by your health care provider. °Contact a health care provider if: °· You have  blood in your stool 2-3 days after the procedure. °Get help right away if: °· You have more than a small spotting of blood in your stool. °· You pass large blood clots in your stool. °· Your abdomen is swollen. °· You have nausea or vomiting. °· You have a fever. °· You have increasing abdominal pain that is not relieved with medicine. °This information is not intended to replace advice given to you by your health care provider. Make sure you discuss any questions you have with your health care provider. °Document Released: 02/15/2004 Document Revised: 03/27/2016 Document Reviewed: 09/14/2015 °Elsevier Interactive Patient Education © 2018 [employer] ° °

## 2017-01-30 NOTE — Op Note (Signed)
[facility] Patient Name: Jason Alvarez Procedure Date: 01/30/2017 7:06 AM MRN: [id number] Date of Birth: February 28, 1941 Attending MD: Aviva Signs , MD CSN: [id number] Age: 76 Admit Type: Outpatient Procedure:                Colonoscopy Indications:              High risk colon cancer surveillance: Personal                            history of colonic polyps Providers:                Aviva Signs, MD, Otis Peak B. Gwenlyn Perking RN, RN, Aram Candela Referring MD:              Medicines:                Midazolam 5 mg IV, Meperidine 50 mg IV Complications:            No immediate complications. Estimated blood loss:                            None. Estimated Blood Loss:     Estimated blood loss: none. Procedure:                Pre-Anesthesia Assessment:                           - Prior to the procedure, a History and Physical                            was performed, and patient medications and                            allergies were reviewed. The patient is competent.                            The risks and benefits of the procedure and the                            sedation options and risks were discussed with the                            patient. All questions were answered and informed                            consent was obtained. Patient identification and                            proposed procedure were verified by the physician,                            the nurse and the technician in the endoscopy  suite. Mental Status Examination: alert and                            oriented. Airway Examination: normal oropharyngeal                            airway and neck mobility. Respiratory Examination:                            clear to auscultation. CV Examination: irregularly                            irregular rate and rhythm. Prophylactic                            Antibiotics: The patient does not require              prophylactic antibiotics. Prior Anticoagulants: The                            patient has taken Coumadin (warfarin), last dose                            was 4 days prior to procedure. ASA Grade                            Assessment: III - A patient with severe systemic                            disease. After reviewing the risks and benefits,                            the patient was deemed in satisfactory condition to                            undergo the procedure. The anesthesia plan was to                            use moderate sedation / analgesia (conscious                            sedation). Immediately prior to administration of                            medications, the patient was re-assessed for                            adequacy to receive sedatives. The heart rate,                            respiratory rate, oxygen saturations, blood                            pressure, adequacy of pulmonary ventilation, and  response to care were monitored throughout the                            procedure. The physical status of the patient was                            re-assessed after the procedure.                           After obtaining informed consent, the colonoscope                            was passed under direct vision. Throughout the                            procedure, the patient's blood pressure, pulse, and                            oxygen saturations were monitored continuously. The                            [id number]) was introduced through the anus                            and advanced to the the cecum, identified by the                            appendiceal orifice, ileocecal valve and palpation.                            No anatomical landmarks were photographed. The                            entire colon was well visualized. The colonoscopy                            was performed without difficulty. The  patient                            tolerated the procedure well. The quality of the                            bowel preparation was adequate. Scope In: 7:35:53 AM Scope Out: 7:46:23 AM Scope Withdrawal Time: 0 hours 7 minutes 14 seconds  Total Procedure Duration: 0 hours 10 minutes 30 seconds  Findings:      The perianal and digital rectal examinations were normal.      A 3 mm polyp was found in the cecum. The polyp was sessile. The polyp       was removed with a hot snare. Resection and retrieval were complete.       Estimated blood loss: none.      The entire examined colon appeared normal. Impression:               - One 3 mm polyp in the cecum, removed with  a hot                            snare. Resected and retrieved.                           - The entire examined colon is normal. Moderate Sedation:      Moderate (conscious) sedation was administered by the endoscopy nurse       and supervised by the endoscopist. The following parameters were       monitored: oxygen saturation, heart rate, blood pressure, and response       to care. Total physician intraservice time was 11 minutes. Recommendation:           - Patient has a contact number available for                            emergencies. The signs and symptoms of potential                            delayed complications were discussed with the                            patient. Return to normal activities tomorrow.                            Written discharge instructions were provided to the                            patient.                           - Written discharge instructions were provided to                            the patient.                           - The signs and symptoms of potential delayed                            complications were discussed with the patient.                           - Patient has a contact number available for                            emergencies.                           -  Return to normal activities tomorrow.                           - Resume previous diet.                           - Continue present medications.                           -  Post-Procedure Resumption of Anticoagulants:                            Restart warfarin today 5 mg PO.                           - Repeat colonoscopy in 3 years for surveillance. Procedure Code(s):        --- Professional ---                           [phone], Colonoscopy, flexible; with removal of                            tumor(s), polyp(s), or other lesion(s) by snare                            technique                           G0500, Moderate sedation services provided by the                            same physician or other qualified health care                            professional performing a gastrointestinal                            endoscopic service that sedation supports,                            requiring the presence of an independent trained                            observer to assist in the monitoring of the                            patient's level of consciousness and physiological                            status; initial 15 minutes of intra-service time;                            patient age 66 years or older (additional time may                            be reported with [phone], as appropriate) Diagnosis Code(s):        --- Professional ---                           Z86.010, Personal history of colonic polyps                           D12.0, Benign neoplasm of cecum CPT copyright 2016 American Medical Association. All rights reserved. The codes documented in  this report are preliminary and upon coder review may  be revised to meet current compliance requirements. Aviva Signs, MD Aviva Signs, MD 01/30/2017 7:53:27 AM This report has been signed electronically. Number of Addenda: 0

## 2017-01-30 NOTE — Interval H&P Note (Signed)
History and Physical Interval Note:  01/30/2017 7:29 AM  Jason Alvarez  has presented today for surgery, with the diagnosis of history of colon polyps  The various methods of treatment have been discussed with the patient and family. After consideration of risks, benefits and other options for treatment, the patient has consented to  Procedure(s): COLONOSCOPY (N/A) as a surgical intervention .  The patient's history has been reviewed, patient examined, no change in status, stable for surgery.  I have reviewed the patient's chart and labs.  Questions were answered to the patient's satisfaction.     Aviva Signs

## 2017-02-01 ENCOUNTER — Telehealth: Payer: Self-pay | Admitting: General Surgery

## 2017-02-01 ENCOUNTER — Encounter (HOSPITAL_COMMUNITY): Payer: Self-pay | Admitting: General Surgery

## 2017-02-01 NOTE — Telephone Encounter (Signed)
Patient was notified of pathology results. May have a follow-up colonoscopy at the age of 74 should he have symptoms. No malignancy was seen.

## 2017-02-08 ENCOUNTER — Ambulatory Visit (INDEPENDENT_AMBULATORY_CARE_PROVIDER_SITE_OTHER): Payer: Medicare HMO | Admitting: Pharmacist Clinician (PhC)/ Clinical Pharmacy Specialist

## 2017-02-08 ENCOUNTER — Other Ambulatory Visit: Payer: Self-pay | Admitting: Cardiovascular Disease

## 2017-02-08 DIAGNOSIS — I4891 Unspecified atrial fibrillation: Secondary | ICD-10-CM | POA: Diagnosis not present

## 2017-02-08 DIAGNOSIS — Z7901 Long term (current) use of anticoagulants: Secondary | ICD-10-CM

## 2017-02-08 LAB — PT WITH INR/FINGERSTICK
INR, fingerstick: 1.5 — ABNORMAL HIGH (ref 0.9–1.1)
PT FINGERSTICK: 18.6 s — AB (ref 10.5–13.1)

## 2017-02-08 LAB — PROTIME-INR: INR: 1.5 — AB (ref <=1.1)

## 2017-02-22 ENCOUNTER — Other Ambulatory Visit: Payer: Self-pay | Admitting: Cardiovascular Disease

## 2017-02-23 ENCOUNTER — Other Ambulatory Visit: Payer: Self-pay | Admitting: Cardiovascular Disease

## 2017-02-23 LAB — PT WITH INR/FINGERSTICK
INR FINGERSTICK: 3.1 — AB (ref 0.9–1.1)
PT, fingerstick: 37.1 seconds — ABNORMAL HIGH (ref 10.5–13.1)

## 2017-02-26 ENCOUNTER — Ambulatory Visit (INDEPENDENT_AMBULATORY_CARE_PROVIDER_SITE_OTHER): Payer: Medicare HMO | Admitting: Pharmacist Clinician (PhC)/ Clinical Pharmacy Specialist

## 2017-02-26 DIAGNOSIS — Z7901 Long term (current) use of anticoagulants: Secondary | ICD-10-CM

## 2017-02-27 ENCOUNTER — Telehealth: Payer: Self-pay | Admitting: Vascular Surgery

## 2017-02-27 NOTE — Telephone Encounter (Signed)
Patient called at 2:50 pm.  Patient reports he had the "veins stripped" in his right leg in March by Dr. Kellie Simmering.  Now, his right foot where his toes start, through his arch has turned brown (like he has a deep suntan).  It doesn't hurt, but he wonders if he should have Dr. Kellie Simmering examine it to make sure that no problem is developing.  I called the patient at 5:10 pm to tell him that his message would be passed on to Dr. Evelena Leyden nurse for advice and that he might get a call tomorrow to ask more questions.  Jason Alvarez

## 2017-03-01 ENCOUNTER — Telehealth: Payer: Self-pay | Admitting: *Deleted

## 2017-03-01 NOTE — Telephone Encounter (Signed)
Returning Mr. Jason Alvarez earlier telephone message regarding discoloration of skin on left foot.  Mr. Jason Alvarez has history of EVLA Right  GSV 01-17-2016 by Dr. Tinnie Gens.  Mr. Jason Alvarez now c/o left foot discoloration (on top of left foot).  Mr. Jason Alvarez denies left ankle or foot swelling.  Mr. Jason Alvarez denies left leg or foot pain.  Mr. Jason Alvarez states he can walk without difficulty or pain.  Mr. Jason Alvarez states his feet and toes are warm to touch (not cold) and "normal color." Mr. Jason Alvarez states he fell "about a month or month and half ago from a small ladder."  Mr. Jason Alvarez states he is applying Arnicare ointment to top of left foot and that has lessened the discoloration.  Advised him to call if he has left foot or ankle swelling or if he experiences pain in left foot.  Mr. Jason Alvarez verbalized understanding.

## 2017-03-06 ENCOUNTER — Ambulatory Visit (HOSPITAL_COMMUNITY)
Admission: RE | Admit: 2017-03-06 | Discharge: 2017-03-06 | Disposition: A | Discharge status: Home / Self Care | Payer: Medicare HMO | Source: Ambulatory Visit | Attending: Nurse Practitioner | Admitting: Nurse Practitioner

## 2017-03-06 ENCOUNTER — Encounter (HOSPITAL_COMMUNITY): Payer: Self-pay | Admitting: Nurse Practitioner

## 2017-03-06 VITALS — BP 166/62 | HR 55 | Ht 75.0 in | Wt 227.6 lb

## 2017-03-06 DIAGNOSIS — I480 Paroxysmal atrial fibrillation: Secondary | ICD-10-CM | POA: Diagnosis not present

## 2017-03-06 DIAGNOSIS — I729 Aneurysm of unspecified site: Secondary | ICD-10-CM | POA: Diagnosis not present

## 2017-03-06 DIAGNOSIS — R69 Illness, unspecified: Secondary | ICD-10-CM | POA: Diagnosis not present

## 2017-03-06 DIAGNOSIS — Z87442 Personal history of urinary calculi: Secondary | ICD-10-CM | POA: Insufficient documentation

## 2017-03-06 DIAGNOSIS — D693 Immune thrombocytopenic purpura: Secondary | ICD-10-CM | POA: Diagnosis not present

## 2017-03-06 DIAGNOSIS — F419 Anxiety disorder, unspecified: Secondary | ICD-10-CM | POA: Diagnosis not present

## 2017-03-06 DIAGNOSIS — Z7901 Long term (current) use of anticoagulants: Secondary | ICD-10-CM | POA: Diagnosis not present

## 2017-03-06 DIAGNOSIS — E119 Type 2 diabetes mellitus without complications: Secondary | ICD-10-CM | POA: Insufficient documentation

## 2017-03-06 DIAGNOSIS — Z7984 Long term (current) use of oral hypoglycemic drugs: Secondary | ICD-10-CM | POA: Insufficient documentation

## 2017-03-06 DIAGNOSIS — D696 Thrombocytopenia, unspecified: Secondary | ICD-10-CM | POA: Diagnosis not present

## 2017-03-06 DIAGNOSIS — I10 Essential (primary) hypertension: Secondary | ICD-10-CM | POA: Insufficient documentation

## 2017-03-06 NOTE — Progress Notes (Signed)
Patient ID: Jason Alvarez, male   DOB: 1941-04-07, 76 y.o.   MRN: [id number]     Primary Care Physician: Redmond School, MD Referring Physician: Dr. Charlyne Petrin is a 76 y.o. male with a h/o PAF on flecainide. He has noted increase in afib burden on occasion but is still happy with flecainide and does not want to change treatment at this time.  Continues on warfarin.   F/u in afib clinic, 8/21. His afib burden is about the same but is still acceptable. He is happy to continues with flecainide. He continues on warfarin without bleeding issues, followed at [facility].  Today, he denies symptoms of palpitations, chest pain, shortness of breath, orthopnea, PND, lower extremity edema, dizziness, presyncope, syncope, or neurologic sequela. The patient is tolerating medications without difficulties and is otherwise without complaint today.   Past Medical History:  Diagnosis Date  . A-fib ([place])    paf with NSVT on MCOT, on coumadin, echo 01/31/08-nl lv, mild aortic valve sclerosis   . Aneurysm ([place])   . Anxiety   . Atrial fibrillation ([place])   . Carotid bruit    doppler 05/17/12-mild plaque with no diameter reduction  . Chronic anticoagulation   . Chronic ITP (idiopathic thrombocytopenia) ([facility]) 04/10/2012   Low grade thrombocytopenia consistent with chronic ITP not in need of therapy.   . DM (diabetes mellitus) ([place])   . Dysrhythmia    afib  . H/O cardiovascular stress test 01/31/08   no ishcemia, ef 61%  . History of kidney stones   . HTN (hypertension)   . Hypertension   . ITP (idiopathic thrombocytopenic purpura)   . Thrombocytopenia ([place])    Past Surgical History:  Procedure Laterality Date  . CIRCUMCISION    . COLONOSCOPY  06/25/2012   Procedure: COLONOSCOPY;  Surgeon: Jamesetta So, MD;  [facility];  Service: Gastroenterology;  Laterality: N/A;  . COLONOSCOPY N/A 01/30/2017   Procedure: COLONOSCOPY;  Surgeon: Aviva Signs, MD;  [facility];   Service: Gastroenterology;  Laterality: N/A;  . KNEE ARTHROSCOPY     right and left knee/1992 and 2000  . POLYPECTOMY  01/30/2017   Procedure: POLYPECTOMY;  Surgeon: Aviva Signs, MD;  [facility];  Service: Gastroenterology;;  colon  . VASECTOMY      Current Outpatient Prescriptions  Medication Sig Dispense Refill  . acetaminophen (TYLENOL) 500 MG tablet Take 500 mg by mouth daily as needed for moderate pain or headache.    . ALPRAZolam (XANAX) 1 MG tablet Take 1 mg by mouth 3 (three) times daily as needed for anxiety.     Marland Kitchen amLODipine (NORVASC) 10 MG tablet Take 1 tablet (10 mg total) by mouth daily. 30 tablet 6  . diphenoxylate-atropine (LOMOTIL) 2.5-0.025 MG tablet Take 1 tablet by mouth as needed for diarrhea or loose stools.    . flecainide (TAMBOCOR) 100 MG tablet TAKE ONE TABLET BY MOUTH TWICE DAILY 60 tablet 6  . glipiZIDE-metformin (METAGLIP) 5-500 MG per tablet Take 1 tablet by mouth daily.     . hydrochlorothiazide (HYDRODIURIL) 25 MG tablet Take 25 mg by mouth daily.      Marland Kitchen HYDROcodone-acetaminophen (NORCO) 10-325 MG tablet Take 1 tablet by mouth daily as needed for severe pain.    . metoprolol succinate (TOPROL-XL) 50 MG 24 hr tablet TAKE ONE (1) TABLET EACH DAY (Patient taking differently: Take 25mg  by mouth twice daily) 90 tablet 2  . ramipril (ALTACE) 10 MG capsule Take  10 mg by mouth 2 (two) times daily.    Marland Kitchen triamcinolone cream (KENALOG) 0.1 % Apply 1 application topically daily as needed for rash.    . warfarin (COUMADIN) 5 MG tablet Take 1 to 1 and 1/2 tablets every day as directed by coumadin clinic 120 tablet 0   No current facility-administered medications for this encounter.     Allergies  Allergen Reactions  . Penicillins Other (See Comments)    Childhood allergy Has patient had a PCN reaction causing immediate rash, facial/tongue/throat swelling, SOB or lightheadedness with hypotension: Unknown Has patient had a PCN reaction causing severe rash  involving mucus membranes or skin necrosis: Unknown Has patient had a PCN reaction that required hospitalization: Unknown Has patient had a PCN reaction occurring within the last 10 years: Unknown If all of the above answers are "NO", then may proceed with Cephalosporin use.    Social History   Social History  . Marital status: Married    Spouse name: N/A  . Number of children: N/A  . Years of education: N/A   Occupational History  . Not on file.   Social History Main Topics  . Smoking status: Never Smoker  . Smokeless tobacco: Never Used  . Alcohol use No  . Drug use: No  . Sexual activity: Not on file   Other Topics Concern  . Not on file   Social History Narrative  . No narrative on file    Family History  Problem Relation Age of Onset  . Prostate cancer Father   . Cancer Sister        breast  . Cancer Brother        lung  . Melanoma Mother     ROS- All systems are reviewed and negative except as per the HPI above  Physical Exam: Vitals:   03/06/17 0840  BP: (!) 166/62  Pulse: (!) 55  Weight: 227 lb 9.6 oz (103.2 kg)  Height: 6\' 3"  (1.905 m)    GEN- The patient is well appearing, alert and oriented x 3 today.   Head- normocephalic, atraumatic Eyes-  Sclera clear, conjunctiva pink Ears- hearing intact Oropharynx- clear Neck- supple, no JVP Lymph- no cervical lymphadenopathy Lungs- Clear to ausculation bilaterally, normal work of breathing Heart- Regular rate and rhythm, no murmurs, rubs or gallops, PMI not laterally displaced GI- soft, NT, ND, + BS Extremities- no clubbing, cyanosis, or edema MS- no significant deformity or atrophy Skin- no rash or lesion Psych- euthymic mood, full affect Neuro- strength and sensation are intact  EKG- Sinus brady at 55 bpm with first degree AV block with Pr int 232 ms,qrs int 114 ms, qtc 451 Epic records reviewed  Assessment and Plan: 1. PAF Continue  Flecainide  100 mg bid Continue metoprolol Continue  warfarin, followed by [facility]   2.HTN Not optimal, he states white coat hypertension States readings at home 505-397 systolic Suspect salt  indiscretion   Continue ramipril, hctz Continue  Amlodipine 10 mg a day  3. Chadsvasc score of at least 4 Continue warfarin  F/u 4 months  Butch Penny C. Lily Velasquez, [facility] [address] [place],  [address] [phone]

## 2017-03-08 DIAGNOSIS — R972 Elevated prostate specific antigen [PSA]: Secondary | ICD-10-CM | POA: Diagnosis not present

## 2017-03-15 ENCOUNTER — Telehealth: Payer: Self-pay | Admitting: Pharmacist Clinician (PhC)/ Clinical Pharmacy Specialist

## 2017-03-15 NOTE — Telephone Encounter (Signed)
Coumadin letter 

## 2017-03-16 ENCOUNTER — Other Ambulatory Visit: Payer: Self-pay | Admitting: Cardiovascular Disease

## 2017-03-16 ENCOUNTER — Ambulatory Visit (INDEPENDENT_AMBULATORY_CARE_PROVIDER_SITE_OTHER): Payer: Medicare HMO | Admitting: Urology

## 2017-03-16 DIAGNOSIS — R972 Elevated prostate specific antigen [PSA]: Secondary | ICD-10-CM | POA: Diagnosis not present

## 2017-03-16 DIAGNOSIS — R3915 Urgency of urination: Secondary | ICD-10-CM | POA: Diagnosis not present

## 2017-03-16 DIAGNOSIS — I4891 Unspecified atrial fibrillation: Secondary | ICD-10-CM | POA: Diagnosis not present

## 2017-03-16 DIAGNOSIS — Z7901 Long term (current) use of anticoagulants: Secondary | ICD-10-CM | POA: Diagnosis not present

## 2017-03-16 DIAGNOSIS — N401 Enlarged prostate with lower urinary tract symptoms: Secondary | ICD-10-CM

## 2017-03-16 LAB — PT WITH INR/FINGERSTICK
INR, fingerstick: 2.5 — ABNORMAL HIGH (ref 0.9–1.1)
PT, fingerstick: 30 seconds — ABNORMAL HIGH (ref 10.5–13.1)

## 2017-03-20 ENCOUNTER — Ambulatory Visit (INDEPENDENT_AMBULATORY_CARE_PROVIDER_SITE_OTHER): Payer: Medicare HMO | Admitting: Pharmacist

## 2017-03-20 DIAGNOSIS — Z7901 Long term (current) use of anticoagulants: Secondary | ICD-10-CM

## 2017-03-20 DIAGNOSIS — I480 Paroxysmal atrial fibrillation: Secondary | ICD-10-CM | POA: Diagnosis not present

## 2017-04-13 ENCOUNTER — Other Ambulatory Visit: Payer: Self-pay | Admitting: Cardiovascular Disease

## 2017-04-13 DIAGNOSIS — Z7901 Long term (current) use of anticoagulants: Secondary | ICD-10-CM | POA: Diagnosis not present

## 2017-04-13 DIAGNOSIS — I4891 Unspecified atrial fibrillation: Secondary | ICD-10-CM | POA: Diagnosis not present

## 2017-04-13 LAB — PROTIME-INR: INR: 3.3 — AB (ref <=1.1)

## 2017-04-16 ENCOUNTER — Ambulatory Visit (INDEPENDENT_AMBULATORY_CARE_PROVIDER_SITE_OTHER): Payer: Medicare HMO | Admitting: Pharmacist

## 2017-04-16 DIAGNOSIS — Z7901 Long term (current) use of anticoagulants: Secondary | ICD-10-CM

## 2017-04-18 LAB — PT WITH INR/FINGERSTICK
INR, fingerstick: 3.3 ratio — ABNORMAL HIGH
PT, fingerstick: 39.4 s — ABNORMAL HIGH (ref 10.5–13.1)

## 2017-04-20 ENCOUNTER — Other Ambulatory Visit (HOSPITAL_COMMUNITY): Payer: Self-pay | Admitting: Nurse Practitioner

## 2017-05-07 ENCOUNTER — Other Ambulatory Visit: Payer: Self-pay | Admitting: Cardiovascular Disease

## 2017-05-07 DIAGNOSIS — R69 Illness, unspecified: Secondary | ICD-10-CM | POA: Diagnosis not present

## 2017-05-07 DIAGNOSIS — E1165 Type 2 diabetes mellitus with hyperglycemia: Secondary | ICD-10-CM | POA: Diagnosis not present

## 2017-05-07 DIAGNOSIS — Z1389 Encounter for screening for other disorder: Secondary | ICD-10-CM | POA: Diagnosis not present

## 2017-05-07 DIAGNOSIS — R42 Dizziness and giddiness: Secondary | ICD-10-CM | POA: Diagnosis not present

## 2017-05-07 DIAGNOSIS — E663 Overweight: Secondary | ICD-10-CM | POA: Diagnosis not present

## 2017-05-07 DIAGNOSIS — Z7901 Long term (current) use of anticoagulants: Secondary | ICD-10-CM | POA: Diagnosis not present

## 2017-05-07 DIAGNOSIS — Z6827 Body mass index (BMI) 27.0-27.9, adult: Secondary | ICD-10-CM | POA: Diagnosis not present

## 2017-05-07 DIAGNOSIS — I4891 Unspecified atrial fibrillation: Secondary | ICD-10-CM | POA: Diagnosis not present

## 2017-05-07 DIAGNOSIS — G894 Chronic pain syndrome: Secondary | ICD-10-CM | POA: Diagnosis not present

## 2017-05-07 LAB — PROTIME-INR: INR: 2.6 — AB (ref <=1.1)

## 2017-05-08 LAB — PT WITH INR/FINGERSTICK
INR, fingerstick: 2.6 ratio — ABNORMAL HIGH
PT FINGERSTICK: 31.5 s — AB (ref 10.5–13.1)

## 2017-05-09 ENCOUNTER — Ambulatory Visit (INDEPENDENT_AMBULATORY_CARE_PROVIDER_SITE_OTHER): Payer: Medicare HMO | Admitting: Pharmacist Clinician (PhC)/ Clinical Pharmacy Specialist

## 2017-05-09 DIAGNOSIS — I480 Paroxysmal atrial fibrillation: Secondary | ICD-10-CM

## 2017-05-09 DIAGNOSIS — Z7901 Long term (current) use of anticoagulants: Secondary | ICD-10-CM

## 2017-05-10 DIAGNOSIS — Z23 Encounter for immunization: Secondary | ICD-10-CM | POA: Diagnosis not present

## 2017-05-15 DIAGNOSIS — I10 Essential (primary) hypertension: Secondary | ICD-10-CM | POA: Diagnosis not present

## 2017-05-15 DIAGNOSIS — Z0000 Encounter for general adult medical examination without abnormal findings: Secondary | ICD-10-CM | POA: Diagnosis not present

## 2017-05-15 DIAGNOSIS — Z23 Encounter for immunization: Secondary | ICD-10-CM | POA: Diagnosis not present

## 2017-05-15 DIAGNOSIS — Z125 Encounter for screening for malignant neoplasm of prostate: Secondary | ICD-10-CM | POA: Diagnosis not present

## 2017-06-04 ENCOUNTER — Other Ambulatory Visit: Payer: Self-pay | Admitting: Cardiovascular Disease

## 2017-06-04 DIAGNOSIS — I4891 Unspecified atrial fibrillation: Secondary | ICD-10-CM | POA: Diagnosis not present

## 2017-06-04 DIAGNOSIS — Z7901 Long term (current) use of anticoagulants: Secondary | ICD-10-CM | POA: Diagnosis not present

## 2017-06-04 LAB — PROTIME-INR
INR: 1.9 — AB
PROTHROMBIN TIME: 20.5 s — AB (ref 9.0–11.5)

## 2017-06-06 ENCOUNTER — Ambulatory Visit (INDEPENDENT_AMBULATORY_CARE_PROVIDER_SITE_OTHER): Payer: Medicare HMO | Admitting: Pharmacist Clinician (PhC)/ Clinical Pharmacy Specialist

## 2017-06-06 DIAGNOSIS — I480 Paroxysmal atrial fibrillation: Secondary | ICD-10-CM | POA: Diagnosis not present

## 2017-06-06 DIAGNOSIS — Z7901 Long term (current) use of anticoagulants: Secondary | ICD-10-CM

## 2017-06-19 ENCOUNTER — Other Ambulatory Visit: Payer: Self-pay | Admitting: Cardiovascular Disease

## 2017-07-02 ENCOUNTER — Other Ambulatory Visit: Payer: Self-pay | Admitting: Cardiovascular Disease

## 2017-07-02 DIAGNOSIS — I4891 Unspecified atrial fibrillation: Secondary | ICD-10-CM | POA: Diagnosis not present

## 2017-07-02 DIAGNOSIS — Z7901 Long term (current) use of anticoagulants: Secondary | ICD-10-CM | POA: Diagnosis not present

## 2017-07-02 LAB — PROTIME-INR
INR: 3 — AB
PROTHROMBIN TIME: 31.1 s — AB (ref 9.0–11.5)

## 2017-07-03 ENCOUNTER — Ambulatory Visit (INDEPENDENT_AMBULATORY_CARE_PROVIDER_SITE_OTHER): Payer: Medicare HMO | Admitting: Pharmacist Clinician (PhC)/ Clinical Pharmacy Specialist

## 2017-07-03 DIAGNOSIS — Z7901 Long term (current) use of anticoagulants: Secondary | ICD-10-CM

## 2017-07-03 DIAGNOSIS — I480 Paroxysmal atrial fibrillation: Secondary | ICD-10-CM

## 2017-07-04 ENCOUNTER — Encounter (HOSPITAL_COMMUNITY): Payer: Self-pay | Admitting: Nurse Practitioner

## 2017-07-04 ENCOUNTER — Ambulatory Visit (HOSPITAL_COMMUNITY)
Admission: RE | Admit: 2017-07-04 | Discharge: 2017-07-04 | Disposition: A | Discharge status: Home / Self Care | Payer: Medicare HMO | Source: Ambulatory Visit | Attending: Nurse Practitioner | Admitting: Nurse Practitioner

## 2017-07-04 VITALS — BP 166/68 | HR 53 | Ht 75.0 in | Wt 223.0 lb

## 2017-07-04 DIAGNOSIS — I480 Paroxysmal atrial fibrillation: Secondary | ICD-10-CM

## 2017-07-04 DIAGNOSIS — E119 Type 2 diabetes mellitus without complications: Secondary | ICD-10-CM | POA: Diagnosis not present

## 2017-07-04 DIAGNOSIS — D696 Thrombocytopenia, unspecified: Secondary | ICD-10-CM | POA: Insufficient documentation

## 2017-07-04 DIAGNOSIS — Z7901 Long term (current) use of anticoagulants: Secondary | ICD-10-CM | POA: Diagnosis not present

## 2017-07-04 DIAGNOSIS — I440 Atrioventricular block, first degree: Secondary | ICD-10-CM | POA: Diagnosis not present

## 2017-07-04 DIAGNOSIS — Z79899 Other long term (current) drug therapy: Secondary | ICD-10-CM | POA: Insufficient documentation

## 2017-07-04 DIAGNOSIS — Z87442 Personal history of urinary calculi: Secondary | ICD-10-CM | POA: Insufficient documentation

## 2017-07-04 DIAGNOSIS — R001 Bradycardia, unspecified: Secondary | ICD-10-CM | POA: Insufficient documentation

## 2017-07-04 DIAGNOSIS — I10 Essential (primary) hypertension: Secondary | ICD-10-CM | POA: Insufficient documentation

## 2017-07-04 DIAGNOSIS — Z880 Allergy status to penicillin: Secondary | ICD-10-CM | POA: Diagnosis not present

## 2017-07-04 DIAGNOSIS — I4891 Unspecified atrial fibrillation: Secondary | ICD-10-CM | POA: Diagnosis present

## 2017-07-04 DIAGNOSIS — F419 Anxiety disorder, unspecified: Secondary | ICD-10-CM | POA: Diagnosis not present

## 2017-07-04 DIAGNOSIS — R69 Illness, unspecified: Secondary | ICD-10-CM | POA: Diagnosis not present

## 2017-07-04 NOTE — Progress Notes (Addendum)
Patient ID: Jason Alvarez, male   DOB: 09/05/1940, 76 y.o.   MRN: [id number]     Primary Care Physician: Redmond School, MD Referring Physician: Dr. Charlyne Petrin is a 76 y.o. male with a h/o PAF on flecainide. He has noted increase in afib burden on occasion in the past, but recently has not noted much afib. He is still happy with flecainide and does not want to change treatment at this time.  Continues on warfarin. BP is elevated in office but brings in BP's from home and is well managed at home.   Today, he denies symptoms of palpitations, chest pain, shortness of breath, orthopnea, PND, lower extremity edema, dizziness, presyncope, syncope, or neurologic sequela. The patient is tolerating medications without difficulties and is otherwise without complaint today.   Past Medical History:  Diagnosis Date  . A-fib ([place])    paf with NSVT on MCOT, on coumadin, echo 01/31/08-nl lv, mild aortic valve sclerosis   . Aneurysm ([place])   . Anxiety   . Atrial fibrillation ([place])   . Carotid bruit    doppler 05/17/12-mild plaque with no diameter reduction  . Chronic anticoagulation   . Chronic ITP (idiopathic thrombocytopenia) ([facility]) 04/10/2012   Low grade thrombocytopenia consistent with chronic ITP not in need of therapy.   . DM (diabetes mellitus) ([place])   . Dysrhythmia    afib  . H/O cardiovascular stress test 01/31/08   no ishcemia, ef 61%  . History of kidney stones   . HTN (hypertension)   . Hypertension   . ITP (idiopathic thrombocytopenic purpura)   . Thrombocytopenia ([place])    Past Surgical History:  Procedure Laterality Date  . CIRCUMCISION    . COLONOSCOPY  06/25/2012   Procedure: COLONOSCOPY;  Surgeon: Jamesetta So, MD;  [facility];  Service: Gastroenterology;  Laterality: N/A;  . COLONOSCOPY N/A 01/30/2017   Procedure: COLONOSCOPY;  Surgeon: Aviva Signs, MD;  [facility];  Service: Gastroenterology;  Laterality: N/A;  . KNEE ARTHROSCOPY     right and left knee/1992 and 2000  . POLYPECTOMY  01/30/2017   Procedure: POLYPECTOMY;  Surgeon: Aviva Signs, MD;  [facility];  Service: Gastroenterology;;  colon  . VASECTOMY      Current Outpatient Medications  Medication Sig Dispense Refill  . acetaminophen (TYLENOL) 500 MG tablet Take 500 mg by mouth daily as needed for moderate pain or headache.    . ALPRAZolam (XANAX) 1 MG tablet Take 1 mg by mouth 3 (three) times daily as needed for anxiety.     Marland Kitchen amLODipine (NORVASC) 10 MG tablet Take 1 tablet (10 mg total) by mouth daily. 30 tablet 6  . diphenoxylate-atropine (LOMOTIL) 2.5-0.025 MG tablet Take 1 tablet by mouth as needed for diarrhea or loose stools.    . flecainide (TAMBOCOR) 100 MG tablet TAKE ONE TABLET BY MOUTH TWICE DAILY 180 tablet 1  . glipiZIDE-metformin (METAGLIP) 5-500 MG per tablet Take 1 tablet by mouth daily.     . hydrochlorothiazide (HYDRODIURIL) 25 MG tablet Take 25 mg by mouth daily.      Marland Kitchen HYDROcodone-acetaminophen (NORCO) 10-325 MG tablet Take 1 tablet by mouth daily as needed for severe pain.    . metoprolol succinate (TOPROL-XL) 50 MG 24 hr tablet TAKE ONE (1) TABLET EACH DAY (Patient taking differently: Take 25mg  by mouth twice daily) 90 tablet 2  . ramipril (ALTACE) 10 MG capsule Take 10 mg by mouth 2 (two) times daily.    Marland Kitchen  triamcinolone cream (KENALOG) 0.1 % Apply 1 application topically daily as needed for rash.    . warfarin (COUMADIN) 5 MG tablet TAKE 1 TO 1&1/2 TABLET EVERY DAY AS DIRECTED 120 tablet 0   No current facility-administered medications for this encounter.     Allergies  Allergen Reactions  . Penicillins Other (See Comments)    Childhood allergy Has patient had a PCN reaction causing immediate rash, facial/tongue/throat swelling, SOB or lightheadedness with hypotension: Unknown Has patient had a PCN reaction causing severe rash involving mucus membranes or skin necrosis: Unknown Has patient had a PCN reaction that required  hospitalization: Unknown Has patient had a PCN reaction occurring within the last 10 years: Unknown If all of the above answers are "NO", then may proceed with Cephalosporin use.    Social History   Socioeconomic History  . Marital status: Married    Spouse name: Not on file  . Number of children: Not on file  . Years of education: Not on file  . Highest education level: Not on file  Social Needs  . Financial resource strain: Not on file  . Food insecurity - worry: Not on file  . Food insecurity - inability: Not on file  . Transportation needs - medical: Not on file  . Transportation needs - non-medical: Not on file  Occupational History  . Not on file  Tobacco Use  . Smoking status: Never Smoker  . Smokeless tobacco: Never Used  Substance and Sexual Activity  . Alcohol use: No  . Drug use: No  . Sexual activity: Not on file  Other Topics Concern  . Not on file  Social History Narrative  . Not on file    Family History  Problem Relation Age of Onset  . Prostate cancer Father   . Cancer Sister        breast  . Cancer Brother        lung  . Melanoma Mother     ROS- All systems are reviewed and negative except as per the HPI above  Physical Exam: Vitals:   07/04/17 0922  BP: (!) 166/68  Pulse: (!) 53  Weight: 223 lb (101.2 kg)  Height: 6\' 3"  (1.905 m)    GEN- The patient is well appearing, alert and oriented x 3 today.   Head- normocephalic, atraumatic Eyes-  Sclera clear, conjunctiva pink Ears- hearing intact Oropharynx- clear Neck- supple, no JVP Lymph- no cervical lymphadenopathy Lungs- Clear to ausculation bilaterally, normal work of breathing Heart- Regular rate and rhythm, no murmurs, rubs or gallops, PMI not laterally displaced GI- soft, NT, ND, + BS Extremities- no clubbing, cyanosis, or edema MS- no significant deformity or atrophy Skin- no rash or lesion Psych- euthymic mood, full affect Neuro- strength and sensation are intact  EKG- Sinus  brady at 53 bpm with first degree AV block with Pr int 230 ms,qrs int 112 ms, qtc 452 Epic records reviewed  Assessment and Plan: 1. PAF Continue  Flecainide 100 mg bid Continue metoprolol Continue warfarin, followed by [facility]  2.HTN Not optimal, in office, he states white coat hypertension Readings at home 193-790 systolic Suspect salt  indiscretion   Continue ramipril, hctz Continue  Amlodipine 10 mg a day  3. Chadsvasc score of at least 4 Continue warfarin  F/u 6 months  Geroge Baseman. Calle Schader, [facility] [address] [place], [place] [address] [phone]

## 2017-07-04 NOTE — Patient Instructions (Signed)
If you go into Afib -- take an extra 1/2 tablet of metoprolol if after 2 hours you are still in afib can take an extra 1/2 tablet of flecainide.

## 2017-07-04 NOTE — Addendum Note (Signed)
Encounter addended by: Sherran Needs, NP on: 07/04/2017 10:26 AM  Actions taken: Sign clinical note

## 2017-07-05 ENCOUNTER — Telehealth: Payer: Self-pay | Admitting: Cardiology

## 2017-07-05 NOTE — Telephone Encounter (Signed)
Received page from patient.  He normally takes Coumadin 5 mg daily except for Wednesdays, when he takes 7.5 mg.  This evening (Thursday), he took 7.5 mg by mistake.  He denies any overt symptoms of bleeding.  I advised him to continue taking Coumadin on his usual schedule.  Additionally, we discussed watching out for any bleeding symptoms, including alarm symptoms that would prompt ED evaluation (i.e GI bleeding, lightheadedness, synco) pe.  The patient will remain vigilant for these, and will also plan to call the office to get an earlier INR check if he notices an increasing burden of nuisance bleeding.

## 2017-07-18 ENCOUNTER — Other Ambulatory Visit: Payer: Self-pay | Admitting: Cardiovascular Disease

## 2017-07-18 DIAGNOSIS — Z7901 Long term (current) use of anticoagulants: Secondary | ICD-10-CM | POA: Diagnosis not present

## 2017-07-18 DIAGNOSIS — I4891 Unspecified atrial fibrillation: Secondary | ICD-10-CM | POA: Diagnosis not present

## 2017-07-18 LAB — PROTIME-INR
INR: 1.9 — ABNORMAL HIGH
Prothrombin Time: 20.3 s — ABNORMAL HIGH (ref 9.0–11.5)

## 2017-07-19 ENCOUNTER — Ambulatory Visit (INDEPENDENT_AMBULATORY_CARE_PROVIDER_SITE_OTHER): Payer: Medicare HMO | Admitting: Pharmacist Clinician (PhC)/ Clinical Pharmacy Specialist

## 2017-07-19 DIAGNOSIS — I480 Paroxysmal atrial fibrillation: Secondary | ICD-10-CM | POA: Diagnosis not present

## 2017-07-19 DIAGNOSIS — Z7901 Long term (current) use of anticoagulants: Secondary | ICD-10-CM | POA: Diagnosis not present

## 2017-08-07 DIAGNOSIS — H524 Presbyopia: Secondary | ICD-10-CM | POA: Diagnosis not present

## 2017-08-07 DIAGNOSIS — H52203 Unspecified astigmatism, bilateral: Secondary | ICD-10-CM | POA: Diagnosis not present

## 2017-08-07 DIAGNOSIS — Z7984 Long term (current) use of oral hypoglycemic drugs: Secondary | ICD-10-CM | POA: Diagnosis not present

## 2017-08-07 DIAGNOSIS — Z961 Presence of intraocular lens: Secondary | ICD-10-CM | POA: Diagnosis not present

## 2017-08-07 DIAGNOSIS — E119 Type 2 diabetes mellitus without complications: Secondary | ICD-10-CM | POA: Diagnosis not present

## 2017-08-18 ENCOUNTER — Other Ambulatory Visit: Payer: Self-pay | Admitting: Cardiovascular Disease

## 2017-08-18 DIAGNOSIS — Z7901 Long term (current) use of anticoagulants: Secondary | ICD-10-CM | POA: Diagnosis not present

## 2017-08-18 DIAGNOSIS — I4891 Unspecified atrial fibrillation: Secondary | ICD-10-CM | POA: Diagnosis not present

## 2017-08-18 LAB — PT WITH INR/FINGERSTICK
INR FINGERSTICK: 3.3 ratio — AB
PT FINGERSTICK: 39.7 s — AB (ref 10.5–13.1)

## 2017-08-18 LAB — POCT INR: INR: 3.3

## 2017-08-22 ENCOUNTER — Ambulatory Visit (INDEPENDENT_AMBULATORY_CARE_PROVIDER_SITE_OTHER): Payer: Medicare HMO | Admitting: Pharmacist

## 2017-08-22 DIAGNOSIS — Z7901 Long term (current) use of anticoagulants: Secondary | ICD-10-CM | POA: Diagnosis not present

## 2017-08-23 DIAGNOSIS — R69 Illness, unspecified: Secondary | ICD-10-CM | POA: Diagnosis not present

## 2017-08-23 DIAGNOSIS — D693 Immune thrombocytopenic purpura: Secondary | ICD-10-CM | POA: Diagnosis not present

## 2017-08-23 DIAGNOSIS — E1165 Type 2 diabetes mellitus with hyperglycemia: Secondary | ICD-10-CM | POA: Diagnosis not present

## 2017-08-23 DIAGNOSIS — E1142 Type 2 diabetes mellitus with diabetic polyneuropathy: Secondary | ICD-10-CM | POA: Diagnosis not present

## 2017-08-23 DIAGNOSIS — Z6827 Body mass index (BMI) 27.0-27.9, adult: Secondary | ICD-10-CM | POA: Diagnosis not present

## 2017-08-23 DIAGNOSIS — N401 Enlarged prostate with lower urinary tract symptoms: Secondary | ICD-10-CM | POA: Diagnosis not present

## 2017-08-23 DIAGNOSIS — I10 Essential (primary) hypertension: Secondary | ICD-10-CM | POA: Diagnosis not present

## 2017-08-23 DIAGNOSIS — L98499 Non-pressure chronic ulcer of skin of other sites with unspecified severity: Secondary | ICD-10-CM | POA: Diagnosis not present

## 2017-08-23 DIAGNOSIS — G894 Chronic pain syndrome: Secondary | ICD-10-CM | POA: Diagnosis not present

## 2017-08-23 DIAGNOSIS — B351 Tinea unguium: Secondary | ICD-10-CM | POA: Diagnosis not present

## 2017-08-28 DIAGNOSIS — R69 Illness, unspecified: Secondary | ICD-10-CM | POA: Diagnosis not present

## 2017-09-13 ENCOUNTER — Other Ambulatory Visit (HOSPITAL_COMMUNITY): Payer: Self-pay | Admitting: Nurse Practitioner

## 2017-09-13 ENCOUNTER — Other Ambulatory Visit: Payer: Self-pay | Admitting: Cardiovascular Disease

## 2017-09-17 ENCOUNTER — Ambulatory Visit (INDEPENDENT_AMBULATORY_CARE_PROVIDER_SITE_OTHER): Payer: Medicare HMO | Admitting: Pharmacist

## 2017-09-17 ENCOUNTER — Other Ambulatory Visit: Payer: Self-pay | Admitting: Cardiovascular Disease

## 2017-09-17 DIAGNOSIS — Z7901 Long term (current) use of anticoagulants: Secondary | ICD-10-CM | POA: Diagnosis not present

## 2017-09-17 DIAGNOSIS — I4891 Unspecified atrial fibrillation: Secondary | ICD-10-CM | POA: Diagnosis not present

## 2017-09-17 LAB — PROTIME-INR
INR: 2 — AB (ref <=1.1)
INR: 2 — ABNORMAL HIGH
PROTHROMBIN TIME: 20.7 s — AB (ref 9.0–11.5)

## 2017-09-17 LAB — ILLEGIBLE FAX NUMB

## 2017-10-12 ENCOUNTER — Other Ambulatory Visit (HOSPITAL_COMMUNITY)
Admission: RE | Admit: 2017-10-12 | Discharge: 2017-10-12 | Disposition: A | Discharge status: Home / Self Care | Payer: Medicare HMO | Source: Other Acute Inpatient Hospital | Attending: Urology | Admitting: Urology

## 2017-10-12 ENCOUNTER — Ambulatory Visit: Payer: Medicare HMO | Admitting: Urology

## 2017-10-12 DIAGNOSIS — N481 Balanitis: Secondary | ICD-10-CM

## 2017-10-12 DIAGNOSIS — R3915 Urgency of urination: Secondary | ICD-10-CM | POA: Insufficient documentation

## 2017-10-12 LAB — URINALYSIS, COMPLETE (UACMP) WITH MICROSCOPIC
Bacteria, UA: NONE SEEN
Bilirubin Urine: NEGATIVE
Glucose, UA: >=500 mg/dL — AB
Hgb urine dipstick: NEGATIVE
Ketones, ur: NEGATIVE mg/dL
Leukocytes, UA: NEGATIVE
Nitrite: NEGATIVE
PROTEIN: NEGATIVE mg/dL
SPECIFIC GRAVITY, URINE: 1.027 (ref 1.005–1.030)
SQUAMOUS EPITHELIAL / LPF: NONE SEEN
pH: 5 (ref 5.0–8.0)

## 2017-10-13 ENCOUNTER — Other Ambulatory Visit (HOSPITAL_COMMUNITY): Payer: Self-pay | Admitting: Nurse Practitioner

## 2017-10-13 LAB — URINE CULTURE: CULTURE: NO GROWTH

## 2017-10-16 ENCOUNTER — Encounter (HOSPITAL_COMMUNITY): Payer: Self-pay | Admitting: Nurse Practitioner

## 2017-10-16 ENCOUNTER — Ambulatory Visit (INDEPENDENT_AMBULATORY_CARE_PROVIDER_SITE_OTHER): Payer: Medicare HMO | Admitting: *Deleted

## 2017-10-16 DIAGNOSIS — I4891 Unspecified atrial fibrillation: Secondary | ICD-10-CM

## 2017-10-16 DIAGNOSIS — Z5181 Encounter for therapeutic drug level monitoring: Secondary | ICD-10-CM | POA: Diagnosis not present

## 2017-10-16 LAB — POCT INR: INR: 2

## 2017-10-16 NOTE — Patient Instructions (Signed)
Continue 1 tablet daily except 1.5 tablets each Wednesday.   Repeat INR in 4 weeks.

## 2017-11-13 ENCOUNTER — Ambulatory Visit (INDEPENDENT_AMBULATORY_CARE_PROVIDER_SITE_OTHER): Payer: Medicare HMO | Admitting: *Deleted

## 2017-11-13 DIAGNOSIS — Z7901 Long term (current) use of anticoagulants: Secondary | ICD-10-CM

## 2017-11-13 DIAGNOSIS — I4891 Unspecified atrial fibrillation: Secondary | ICD-10-CM | POA: Diagnosis not present

## 2017-11-13 DIAGNOSIS — Z5181 Encounter for therapeutic drug level monitoring: Secondary | ICD-10-CM

## 2017-11-13 LAB — POCT INR: INR: 2.9

## 2017-11-13 NOTE — Patient Instructions (Signed)
Continue 1 tablet daily except 1.5 tablets each Wednesday.   Repeat INR in 6 weeks.

## 2017-12-11 DIAGNOSIS — R972 Elevated prostate specific antigen [PSA]: Secondary | ICD-10-CM | POA: Diagnosis not present

## 2017-12-14 ENCOUNTER — Ambulatory Visit: Payer: Medicare HMO | Admitting: Urology

## 2017-12-14 DIAGNOSIS — R972 Elevated prostate specific antigen [PSA]: Secondary | ICD-10-CM | POA: Diagnosis not present

## 2017-12-14 DIAGNOSIS — R3915 Urgency of urination: Secondary | ICD-10-CM | POA: Diagnosis not present

## 2017-12-14 DIAGNOSIS — N481 Balanitis: Secondary | ICD-10-CM

## 2017-12-14 DIAGNOSIS — N401 Enlarged prostate with lower urinary tract symptoms: Secondary | ICD-10-CM | POA: Diagnosis not present

## 2017-12-19 ENCOUNTER — Telehealth: Payer: Self-pay | Admitting: *Deleted

## 2017-12-19 NOTE — Telephone Encounter (Signed)
Jason Alvarez is established patient of Dr. Kellie Simmering who is s/p endovenous laser ablation R GSV and stab phlebectomy R leg by Dr. Kellie Simmering in 2017.  [name] states he experienced "dull,aching pain in both legs from the knee down" today.  States he took a hydrocodone tablet and "the pain went away."  Denies foot/ankle swelling, skin discoloration, or severe pain.  [name] states since pain has resolved, he will "wait and call back" if leg pain returns or gets worse. Advised him to keep legs elevated when sitting, wear knee high compression hose, and take Ibuprofen instead of hydrocodone for leg discomfort. [name] verbalized understanding.

## 2017-12-25 ENCOUNTER — Ambulatory Visit (INDEPENDENT_AMBULATORY_CARE_PROVIDER_SITE_OTHER): Payer: Medicare HMO | Admitting: *Deleted

## 2017-12-25 DIAGNOSIS — Z5181 Encounter for therapeutic drug level monitoring: Secondary | ICD-10-CM

## 2017-12-25 DIAGNOSIS — I4891 Unspecified atrial fibrillation: Secondary | ICD-10-CM

## 2017-12-25 LAB — POCT INR: INR: 2 (ref 2.0–3.0)

## 2017-12-25 NOTE — Patient Instructions (Signed)
Take coumadin 1 1/2 tablets tonight then resume 1 tablet daily except 1.5 tablets each Wednesday.   Repeat INR in 6 weeks.

## 2018-01-01 DIAGNOSIS — Z0000 Encounter for general adult medical examination without abnormal findings: Secondary | ICD-10-CM | POA: Diagnosis not present

## 2018-01-01 DIAGNOSIS — Z1389 Encounter for screening for other disorder: Secondary | ICD-10-CM | POA: Diagnosis not present

## 2018-01-01 DIAGNOSIS — G894 Chronic pain syndrome: Secondary | ICD-10-CM | POA: Diagnosis not present

## 2018-01-01 DIAGNOSIS — Z0001 Encounter for general adult medical examination with abnormal findings: Secondary | ICD-10-CM | POA: Diagnosis not present

## 2018-01-01 DIAGNOSIS — Z6827 Body mass index (BMI) 27.0-27.9, adult: Secondary | ICD-10-CM | POA: Diagnosis not present

## 2018-01-01 DIAGNOSIS — R69 Illness, unspecified: Secondary | ICD-10-CM | POA: Diagnosis not present

## 2018-01-01 DIAGNOSIS — E1140 Type 2 diabetes mellitus with diabetic neuropathy, unspecified: Secondary | ICD-10-CM | POA: Diagnosis not present

## 2018-01-01 DIAGNOSIS — K219 Gastro-esophageal reflux disease without esophagitis: Secondary | ICD-10-CM | POA: Diagnosis not present

## 2018-01-02 ENCOUNTER — Encounter (HOSPITAL_COMMUNITY): Payer: Self-pay | Admitting: Nurse Practitioner

## 2018-01-02 ENCOUNTER — Ambulatory Visit (HOSPITAL_COMMUNITY)
Admission: RE | Admit: 2018-01-02 | Discharge: 2018-01-02 | Disposition: A | Discharge status: Home / Self Care | Payer: Medicare HMO | Source: Ambulatory Visit | Attending: Nurse Practitioner | Admitting: Nurse Practitioner

## 2018-01-02 VITALS — BP 152/66 | HR 52 | Ht 75.0 in | Wt 220.8 lb

## 2018-01-02 DIAGNOSIS — R001 Bradycardia, unspecified: Secondary | ICD-10-CM | POA: Diagnosis not present

## 2018-01-02 DIAGNOSIS — Z7901 Long term (current) use of anticoagulants: Secondary | ICD-10-CM | POA: Insufficient documentation

## 2018-01-02 DIAGNOSIS — Z79899 Other long term (current) drug therapy: Secondary | ICD-10-CM | POA: Diagnosis not present

## 2018-01-02 DIAGNOSIS — I480 Paroxysmal atrial fibrillation: Secondary | ICD-10-CM | POA: Diagnosis not present

## 2018-01-02 DIAGNOSIS — Z880 Allergy status to penicillin: Secondary | ICD-10-CM | POA: Insufficient documentation

## 2018-01-02 DIAGNOSIS — R69 Illness, unspecified: Secondary | ICD-10-CM | POA: Diagnosis not present

## 2018-01-02 DIAGNOSIS — Z87442 Personal history of urinary calculi: Secondary | ICD-10-CM | POA: Diagnosis not present

## 2018-01-02 DIAGNOSIS — F419 Anxiety disorder, unspecified: Secondary | ICD-10-CM | POA: Diagnosis not present

## 2018-01-02 DIAGNOSIS — I10 Essential (primary) hypertension: Secondary | ICD-10-CM | POA: Insufficient documentation

## 2018-01-02 DIAGNOSIS — E119 Type 2 diabetes mellitus without complications: Secondary | ICD-10-CM | POA: Insufficient documentation

## 2018-01-02 DIAGNOSIS — D696 Thrombocytopenia, unspecified: Secondary | ICD-10-CM | POA: Insufficient documentation

## 2018-01-02 NOTE — Progress Notes (Signed)
Primary Care Physician: Redmond School, MD Primary Cardiologist: Gwenlyn Found Primary Electrophysiologist: Charlyne Petrin is a 77 y.o. male with a history of paroxysmal atrial fibrillation who presents for follow up in the [facility].  Since last being seen in clinic, the patient reports doing very well.  He has not had significant recurrence of AF on Flecainide. No bleeding issues with Warfarin (followed in [facility]). Today, he  denies symptoms of palpitations, chest pain, shortness of breath, orthopnea, PND, lower extremity edema, dizziness, presyncope, syncope, snoring, daytime somnolence, bleeding, or neurologic sequela. The patient is tolerating medications without difficulties and is otherwise without complaint today.    Atrial Fibrillation Risk Factors:  he does not have symptoms or diagnosis of sleep apnea.  he does not have a history of rheumatic fever.  he has a BMI of Body mass index is 27.6 kg/m.Marland Kitchen Filed Weights   01/02/18 0829  Weight: 220 lb 12.8 oz (100.2 kg)    LA size: 41   Atrial Fibrillation Management history:  Previous antiarrhythmic drugs: Flecainide  Previous cardioversions: none  Previous ablations: none  CHADS2VASC score: 4  Anticoagulation history: Warfarin   Past Medical History:  Diagnosis Date  . A-fib ([place])    paf with NSVT on MCOT, on coumadin, echo 01/31/08-nl lv, mild aortic valve sclerosis   . Aneurysm ([place])   . Anxiety   . Atrial fibrillation ([place])   . Carotid bruit    doppler 05/17/12-mild plaque with no diameter reduction  . Chronic anticoagulation   . Chronic ITP (idiopathic thrombocytopenia) ([facility]) 04/10/2012   Low grade thrombocytopenia consistent with chronic ITP not in need of therapy.   . DM (diabetes mellitus) ([place])   . Dysrhythmia    afib  . H/O cardiovascular stress test 01/31/08   no ishcemia, ef 61%  . History of kidney stones   . HTN (hypertension)   . Hypertension   .  ITP (idiopathic thrombocytopenic purpura)   . Thrombocytopenia ([place])    Past Surgical History:  Procedure Laterality Date  . CIRCUMCISION    . COLONOSCOPY  06/25/2012   Procedure: COLONOSCOPY;  Surgeon: Jamesetta So, MD;  [facility];  Service: Gastroenterology;  Laterality: N/A;  . COLONOSCOPY N/A 01/30/2017   Procedure: COLONOSCOPY;  Surgeon: Aviva Signs, MD;  [facility];  Service: Gastroenterology;  Laterality: N/A;  . KNEE ARTHROSCOPY     right and left knee/1992 and 2000  . POLYPECTOMY  01/30/2017   Procedure: POLYPECTOMY;  Surgeon: Aviva Signs, MD;  [facility];  Service: Gastroenterology;;  colon  . VASECTOMY      Current Outpatient Medications  Medication Sig Dispense Refill  . acetaminophen (TYLENOL) 500 MG tablet Take 500 mg by mouth daily as needed for moderate pain or headache.    . ALPRAZolam (XANAX) 1 MG tablet Take 1 mg by mouth 3 (three) times daily as needed for anxiety.     Marland Kitchen amLODipine (NORVASC) 10 MG tablet Take 1 tablet (10 mg total) by mouth daily. 30 tablet 6  . flecainide (TAMBOCOR) 100 MG tablet TAKE ONE TABLET BY MOUTH TWICE DAILY 180 tablet 1  . glipiZIDE-metformin (METAGLIP) 5-500 MG per tablet Take 1 tablet by mouth daily.     . hydrochlorothiazide (HYDRODIURIL) 25 MG tablet Take 25 mg by mouth daily.      Marland Kitchen HYDROcodone-acetaminophen (NORCO) 10-325 MG tablet Take 1 tablet by mouth daily as needed for severe pain.    Marland Kitchen  metoprolol succinate (TOPROL-XL) 50 MG 24 hr tablet TAKE ONE (1) TABLET EACH DAY 90 tablet 2  . ramipril (ALTACE) 10 MG capsule Take 10 mg by mouth 2 (two) times daily.    Marland Kitchen triamcinolone cream (KENALOG) 0.1 % Apply 1 application topically daily as needed for rash.    . warfarin (COUMADIN) 5 MG tablet TAKE 1 TO 1&1/2 TABLET EVERY DAY AS DIRECTED 135 tablet 0   No current facility-administered medications for this encounter.     Allergies  Allergen Reactions  . Penicillins Other (See Comments)     Childhood allergy Has patient had a PCN reaction causing immediate rash, facial/tongue/throat swelling, SOB or lightheadedness with hypotension: Unknown Has patient had a PCN reaction causing severe rash involving mucus membranes or skin necrosis: Unknown Has patient had a PCN reaction that required hospitalization: Unknown Has patient had a PCN reaction occurring within the last 10 years: Unknown If all of the above answers are "NO", then may proceed with Cephalosporin use.    Social History   Socioeconomic History  . Marital status: Married    Spouse name: Not on file  . Number of children: Not on file  . Years of education: Not on file  . Highest education level: Not on file  Occupational History  . Not on file  Social Needs  . Financial resource strain: Not on file  . Food insecurity:    Worry: Not on file    Inability: Not on file  . Transportation needs:    Medical: Not on file    Non-medical: Not on file  Tobacco Use  . Smoking status: Never Smoker  . Smokeless tobacco: Never Used  Substance and Sexual Activity  . Alcohol use: No  . Drug use: No  . Sexual activity: Not on file  Lifestyle  . Physical activity:    Days per week: Not on file    Minutes per session: Not on file  . Stress: Not on file  Relationships  . Social connections:    Talks on phone: Not on file    Gets together: Not on file    Attends religious service: Not on file    Active member of club or organization: Not on file    Attends meetings of clubs or organizations: Not on file    Relationship status: Not on file  . Intimate partner violence:    Fear of current or ex partner: Not on file    Emotionally abused: Not on file    Physically abused: Not on file    Forced sexual activity: Not on file  Other Topics Concern  . Not on file  Social History Narrative  . Not on file    Family History  Problem Relation Age of Onset  . Prostate cancer Father   . Cancer Sister        breast  .  Cancer Brother        lung  . Melanoma Mother     ROS- All systems are reviewed and negative except as per the HPI above.  Physical Exam: Vitals:   01/02/18 0829  BP: (!) 152/66  Pulse: (!) 52  Weight: 220 lb 12.8 oz (100.2 kg)  Height: 6\' 3"  (1.905 m)    GEN- The patient is well appearing, alert and oriented x 3 today.   Head- normocephalic, atraumatic Eyes-  Sclera clear, conjunctiva pink Ears- hearing intact Oropharynx- clear Neck- supple  Lungs- Clear to ausculation bilaterally, normal work of breathing Heart-  Regular rate and rhythm  GI- soft, NT, ND, + BS Extremities- no clubbing, cyanosis, or edema MS- no significant deformity or atrophy Skin- no rash or lesion Psych- euthymic mood, full affect Neuro- strength and sensation are intact  Wt Readings from Last 3 Encounters:  01/02/18 220 lb 12.8 oz (100.2 kg)  07/04/17 223 lb (101.2 kg)  03/06/17 227 lb 9.6 oz (103.2 kg)    EKG today demonstrates sinus bradycardia, rate 52, PR 267msec, QRS 174msec, QTc 420msec Echo 03/2013 demonstrated EF 55-65%, mild MR.   Epic records are reviewed at length today  Assessment and Plan:  1. Paroxysmal atrial fibrillation Doing well on Flecainide Continue warfarin for CHADS2VASC of 4 EKG stable  2.  HTN Stable No change required today  Follow up with AF clinic 6 months. PCP recently checked BMET and CBC, records requested.   Chanetta Marshall, NP 01/02/2018 8:46 AM

## 2018-01-28 ENCOUNTER — Other Ambulatory Visit: Payer: Self-pay | Admitting: Cardiovascular Disease

## 2018-02-05 ENCOUNTER — Ambulatory Visit (INDEPENDENT_AMBULATORY_CARE_PROVIDER_SITE_OTHER): Payer: Medicare HMO | Admitting: *Deleted

## 2018-02-05 DIAGNOSIS — I4891 Unspecified atrial fibrillation: Secondary | ICD-10-CM | POA: Diagnosis not present

## 2018-02-05 DIAGNOSIS — Z5181 Encounter for therapeutic drug level monitoring: Secondary | ICD-10-CM | POA: Diagnosis not present

## 2018-02-05 LAB — POCT INR: INR: 1.9 — AB (ref 2.0–3.0)

## 2018-02-05 NOTE — Patient Instructions (Signed)
Take coumadin 1 1/2 tablets tonight then increase dose to 1 tablet daily except 1.5 tablets on Sundays and Wednesdays   Repeat INR in 4 weeks.

## 2018-03-05 ENCOUNTER — Ambulatory Visit (INDEPENDENT_AMBULATORY_CARE_PROVIDER_SITE_OTHER): Payer: Medicare HMO | Admitting: Pharmacist

## 2018-03-05 DIAGNOSIS — Z5181 Encounter for therapeutic drug level monitoring: Secondary | ICD-10-CM | POA: Diagnosis not present

## 2018-03-05 DIAGNOSIS — I4891 Unspecified atrial fibrillation: Secondary | ICD-10-CM

## 2018-03-05 DIAGNOSIS — Z7901 Long term (current) use of anticoagulants: Secondary | ICD-10-CM | POA: Diagnosis not present

## 2018-03-05 LAB — POCT INR: INR: 2.4 (ref 2.0–3.0)

## 2018-03-05 NOTE — Patient Instructions (Signed)
Description   Continue dose to 1 tablet daily except 1.5 tablets on Sundays and Wednesdays   Repeat INR in 4 weeks.

## 2018-03-11 DIAGNOSIS — R972 Elevated prostate specific antigen [PSA]: Secondary | ICD-10-CM | POA: Diagnosis not present

## 2018-03-22 ENCOUNTER — Ambulatory Visit: Payer: Medicare HMO | Admitting: Urology

## 2018-03-22 DIAGNOSIS — N401 Enlarged prostate with lower urinary tract symptoms: Secondary | ICD-10-CM | POA: Diagnosis not present

## 2018-03-22 DIAGNOSIS — N481 Balanitis: Secondary | ICD-10-CM

## 2018-03-22 DIAGNOSIS — R3915 Urgency of urination: Secondary | ICD-10-CM | POA: Diagnosis not present

## 2018-03-22 DIAGNOSIS — R972 Elevated prostate specific antigen [PSA]: Secondary | ICD-10-CM | POA: Diagnosis not present

## 2018-04-02 ENCOUNTER — Ambulatory Visit (INDEPENDENT_AMBULATORY_CARE_PROVIDER_SITE_OTHER): Payer: Medicare HMO | Admitting: *Deleted

## 2018-04-02 DIAGNOSIS — Z5181 Encounter for therapeutic drug level monitoring: Secondary | ICD-10-CM | POA: Diagnosis not present

## 2018-04-02 DIAGNOSIS — I4891 Unspecified atrial fibrillation: Secondary | ICD-10-CM | POA: Diagnosis not present

## 2018-04-02 DIAGNOSIS — I471 Supraventricular tachycardia: Secondary | ICD-10-CM

## 2018-04-02 LAB — POCT INR: INR: 2 (ref 2.0–3.0)

## 2018-04-02 NOTE — Patient Instructions (Signed)
Take coumadin 1 1/2 tablets tonight then resume 1 tablet daily except 1.5 tablets on Sundays and Wednesdays   Repeat INR in 4 weeks.

## 2018-04-09 DIAGNOSIS — R201 Hypoesthesia of skin: Secondary | ICD-10-CM | POA: Diagnosis not present

## 2018-04-09 DIAGNOSIS — E1140 Type 2 diabetes mellitus with diabetic neuropathy, unspecified: Secondary | ICD-10-CM | POA: Diagnosis not present

## 2018-04-09 DIAGNOSIS — B351 Tinea unguium: Secondary | ICD-10-CM | POA: Diagnosis not present

## 2018-04-09 DIAGNOSIS — Z6826 Body mass index (BMI) 26.0-26.9, adult: Secondary | ICD-10-CM | POA: Diagnosis not present

## 2018-04-09 DIAGNOSIS — K219 Gastro-esophageal reflux disease without esophagitis: Secondary | ICD-10-CM | POA: Diagnosis not present

## 2018-04-09 DIAGNOSIS — H8113 Benign paroxysmal vertigo, bilateral: Secondary | ICD-10-CM | POA: Diagnosis not present

## 2018-04-09 DIAGNOSIS — Z1389 Encounter for screening for other disorder: Secondary | ICD-10-CM | POA: Diagnosis not present

## 2018-04-09 DIAGNOSIS — H9313 Tinnitus, bilateral: Secondary | ICD-10-CM | POA: Diagnosis not present

## 2018-04-09 DIAGNOSIS — E663 Overweight: Secondary | ICD-10-CM | POA: Diagnosis not present

## 2018-04-09 DIAGNOSIS — N400 Enlarged prostate without lower urinary tract symptoms: Secondary | ICD-10-CM | POA: Diagnosis not present

## 2018-04-16 DIAGNOSIS — R69 Illness, unspecified: Secondary | ICD-10-CM | POA: Diagnosis not present

## 2018-04-25 DIAGNOSIS — L570 Actinic keratosis: Secondary | ICD-10-CM | POA: Diagnosis not present

## 2018-04-25 DIAGNOSIS — D225 Melanocytic nevi of trunk: Secondary | ICD-10-CM | POA: Diagnosis not present

## 2018-04-25 DIAGNOSIS — X32XXXD Exposure to sunlight, subsequent encounter: Secondary | ICD-10-CM | POA: Diagnosis not present

## 2018-04-25 DIAGNOSIS — B078 Other viral warts: Secondary | ICD-10-CM | POA: Diagnosis not present

## 2018-04-30 ENCOUNTER — Ambulatory Visit (INDEPENDENT_AMBULATORY_CARE_PROVIDER_SITE_OTHER): Payer: Medicare HMO | Admitting: *Deleted

## 2018-04-30 DIAGNOSIS — Z5181 Encounter for therapeutic drug level monitoring: Secondary | ICD-10-CM | POA: Diagnosis not present

## 2018-04-30 DIAGNOSIS — I4891 Unspecified atrial fibrillation: Secondary | ICD-10-CM

## 2018-04-30 LAB — POCT INR: INR: 2.2 (ref 2.0–3.0)

## 2018-04-30 NOTE — Patient Instructions (Signed)
Continue coumadin 1 tablet daily except 1.5 tablets on Sundays and Wednesdays   Repeat INR in 6 weeks.  

## 2018-05-08 DIAGNOSIS — Z23 Encounter for immunization: Secondary | ICD-10-CM | POA: Diagnosis not present

## 2018-05-13 ENCOUNTER — Other Ambulatory Visit: Payer: Self-pay | Admitting: Internal Medicine

## 2018-05-29 ENCOUNTER — Telehealth (HOSPITAL_COMMUNITY): Payer: Self-pay | Admitting: *Deleted

## 2018-05-29 MED ORDER — AMLODIPINE BESYLATE 10 MG PO TABS: 5.0000 mg | ORAL_TABLET | Freq: Every day | ORAL | 6 refills | Status: DC

## 2018-05-29 NOTE — Telephone Encounter (Signed)
Patient called in stating for the last couple of weeks he has been having dizziness with going from sitting to standing and his BP is running 120/50s which is lower than normal for him. He has been watching his salt intake with diet recently. Discussed with Jason Palau NP will decrease amlodipine to 5mg  a day. Follow up as scheduled. Pt agrees to plan.

## 2018-06-11 ENCOUNTER — Ambulatory Visit (INDEPENDENT_AMBULATORY_CARE_PROVIDER_SITE_OTHER): Payer: Medicare HMO | Admitting: *Deleted

## 2018-06-11 DIAGNOSIS — I4891 Unspecified atrial fibrillation: Secondary | ICD-10-CM | POA: Diagnosis not present

## 2018-06-11 DIAGNOSIS — Z5181 Encounter for therapeutic drug level monitoring: Secondary | ICD-10-CM

## 2018-06-11 LAB — POCT INR: INR: 2.5 (ref 2.0–3.0)

## 2018-06-11 NOTE — Patient Instructions (Signed)
Continue coumadin 1 tablet daily except 1.5 tablets on Sundays and Wednesdays   Repeat INR in 6 weeks.

## 2018-06-26 DIAGNOSIS — R972 Elevated prostate specific antigen [PSA]: Secondary | ICD-10-CM | POA: Diagnosis not present

## 2018-06-28 ENCOUNTER — Ambulatory Visit: Payer: Medicare HMO | Admitting: Urology

## 2018-07-04 ENCOUNTER — Ambulatory Visit (HOSPITAL_COMMUNITY)
Admission: RE | Admit: 2018-07-04 | Discharge: 2018-07-04 | Disposition: A | Discharge status: Home / Self Care | Payer: Medicare HMO | Source: Ambulatory Visit | Attending: Nurse Practitioner | Admitting: Nurse Practitioner

## 2018-07-04 ENCOUNTER — Encounter (HOSPITAL_COMMUNITY): Payer: Self-pay | Admitting: Nurse Practitioner

## 2018-07-04 VITALS — BP 180/70 | HR 58 | Ht 75.0 in | Wt 212.4 lb

## 2018-07-04 DIAGNOSIS — Z87442 Personal history of urinary calculi: Secondary | ICD-10-CM | POA: Insufficient documentation

## 2018-07-04 DIAGNOSIS — F419 Anxiety disorder, unspecified: Secondary | ICD-10-CM | POA: Diagnosis not present

## 2018-07-04 DIAGNOSIS — Z79899 Other long term (current) drug therapy: Secondary | ICD-10-CM | POA: Insufficient documentation

## 2018-07-04 DIAGNOSIS — R001 Bradycardia, unspecified: Secondary | ICD-10-CM | POA: Insufficient documentation

## 2018-07-04 DIAGNOSIS — I440 Atrioventricular block, first degree: Secondary | ICD-10-CM | POA: Diagnosis not present

## 2018-07-04 DIAGNOSIS — Z7901 Long term (current) use of anticoagulants: Secondary | ICD-10-CM | POA: Diagnosis not present

## 2018-07-04 DIAGNOSIS — I10 Essential (primary) hypertension: Secondary | ICD-10-CM | POA: Diagnosis not present

## 2018-07-04 DIAGNOSIS — E119 Type 2 diabetes mellitus without complications: Secondary | ICD-10-CM | POA: Insufficient documentation

## 2018-07-04 DIAGNOSIS — R69 Illness, unspecified: Secondary | ICD-10-CM | POA: Diagnosis not present

## 2018-07-04 DIAGNOSIS — I480 Paroxysmal atrial fibrillation: Secondary | ICD-10-CM | POA: Diagnosis not present

## 2018-07-04 MED ORDER — FLECAINIDE ACETATE 100 MG PO TABS: 100.0000 mg | ORAL_TABLET | Freq: Two times a day (BID) | ORAL | 2 refills | Status: DC

## 2018-07-04 MED ORDER — RAMIPRIL 10 MG PO CAPS: 10.0000 mg | ORAL_CAPSULE | Freq: Two times a day (BID) | ORAL | 2 refills | Status: DC

## 2018-07-04 NOTE — Progress Notes (Addendum)
Patient ID: Jason Alvarez, male   DOB: 08-May-1941, 77 y.o.   MRN: [id number]     Primary Care Physician: Redmond School, MD Referring Physician: Dr. Charlyne Petrin is a 77 y.o. male with a h/o PAF on flecainide. He reports that he may have had one episode of afib since we saw him last but it resolved quickly. He is happy with his current regimen. Continues on warfarin. BP is elevated in office again today but brings in BP's from home and is well managed at home. He had some dizziness on 10mg  of amlodipine and is now on 5mg . He also admits that his blood glucose is not well controlled.   Today, he denies symptoms of palpitations, chest pain, shortness of breath, orthopnea, PND, lower extremity edema, dizziness, presyncope, syncope, or neurologic sequela. The patient is tolerating medications without difficulties and is otherwise without complaint today.   Past Medical History:  Diagnosis Date  . A-fib ([place])    paf with NSVT on MCOT, on coumadin, echo 01/31/08-nl lv, mild aortic valve sclerosis   . Aneurysm ([place])   . Anxiety   . Atrial fibrillation ([place])   . Carotid bruit    doppler 05/17/12-mild plaque with no diameter reduction  . Chronic anticoagulation   . Chronic ITP (idiopathic thrombocytopenia) ([facility]) 04/10/2012   Low grade thrombocytopenia consistent with chronic ITP not in need of therapy.   . DM (diabetes mellitus) ([place])   . Dysrhythmia    afib  . H/O cardiovascular stress test 01/31/08   no ishcemia, ef 61%  . History of kidney stones   . HTN (hypertension)   . Hypertension   . ITP (idiopathic thrombocytopenic purpura)   . Thrombocytopenia ([place])    Past Surgical History:  Procedure Laterality Date  . CIRCUMCISION    . COLONOSCOPY  06/25/2012   Procedure: COLONOSCOPY;  Surgeon: Jamesetta So, MD;  [facility];  Service: Gastroenterology;  Laterality: N/A;  . COLONOSCOPY N/A 01/30/2017   Procedure: COLONOSCOPY;  Surgeon: Aviva Signs, MD;  [facility];  Service: Gastroenterology;  Laterality: N/A;  . KNEE ARTHROSCOPY     right and left knee/1992 and 2000  . POLYPECTOMY  01/30/2017   Procedure: POLYPECTOMY;  Surgeon: Aviva Signs, MD;  [facility];  Service: Gastroenterology;;  colon  . VASECTOMY      Current Outpatient Medications  Medication Sig Dispense Refill  . ALPRAZolam (XANAX) 1 MG tablet Take 1 mg by mouth 3 (three) times daily as needed for anxiety.     Marland Kitchen amLODipine (NORVASC) 10 MG tablet Take 0.5 tablets (5 mg total) by mouth daily. 30 tablet 6  . flecainide (TAMBOCOR) 100 MG tablet Take 1 tablet (100 mg total) by mouth 2 (two) times daily. 180 tablet 2  . glipiZIDE-metformin (METAGLIP) 5-500 MG per tablet Take 1 tablet by mouth daily.     . hydrochlorothiazide (HYDRODIURIL) 25 MG tablet Take 25 mg by mouth daily.      Marland Kitchen HYDROcodone-acetaminophen (NORCO) 10-325 MG tablet Take 1 tablet by mouth daily as needed for severe pain.    . metoprolol succinate (TOPROL-XL) 50 MG 24 hr tablet TAKE ONE (1) TABLET EACH DAY 90 tablet 2  . ramipril (ALTACE) 10 MG capsule Take 1 capsule (10 mg total) by mouth 2 (two) times daily. 180 capsule 2  . warfarin (COUMADIN) 5 MG tablet TAKE 1 TO 1&1/2 TABLET DAILY AS DIRECTED 135 tablet 3  . acetaminophen (TYLENOL) 500  MG tablet Take 500 mg by mouth daily as needed for moderate pain or headache.    . finasteride (PROSCAR) 5 MG tablet Take 5 mg by mouth daily.    Marland Kitchen triamcinolone cream (KENALOG) 0.1 % Apply 1 application topically daily as needed for rash.     No current facility-administered medications for this encounter.     Allergies  Allergen Reactions  . Penicillins Other (See Comments)    Childhood allergy Has patient had a PCN reaction causing immediate rash, facial/tongue/throat swelling, SOB or lightheadedness with hypotension: Unknown Has patient had a PCN reaction causing severe rash involving mucus membranes or skin necrosis: Unknown Has patient had a PCN  reaction that required hospitalization: Unknown Has patient had a PCN reaction occurring within the last 10 years: Unknown If all of the above answers are "NO", then may proceed with Cephalosporin use.    Social History   Socioeconomic History  . Marital status: Married    Spouse name: Not on file  . Number of children: Not on file  . Years of education: Not on file  . Highest education level: Not on file  Occupational History  . Not on file  Social Needs  . Financial resource strain: Not on file  . Food insecurity:    Worry: Not on file    Inability: Not on file  . Transportation needs:    Medical: Not on file    Non-medical: Not on file  Tobacco Use  . Smoking status: Never Smoker  . Smokeless tobacco: Never Used  Substance and Sexual Activity  . Alcohol use: No  . Drug use: No  . Sexual activity: Not on file  Lifestyle  . Physical activity:    Days per week: Not on file    Minutes per session: Not on file  . Stress: Not on file  Relationships  . Social connections:    Talks on phone: Not on file    Gets together: Not on file    Attends religious service: Not on file    Active member of club or organization: Not on file    Attends meetings of clubs or organizations: Not on file    Relationship status: Not on file  . Intimate partner violence:    Fear of current or ex partner: Not on file    Emotionally abused: Not on file    Physically abused: Not on file    Forced sexual activity: Not on file  Other Topics Concern  . Not on file  Social History Narrative  . Not on file    Family History  Problem Relation Age of Onset  . Prostate cancer Father   . Cancer Sister        breast  . Cancer Brother        lung  . Melanoma Mother     ROS- All systems are reviewed and negative except as per the HPI above  Physical Exam: Vitals:   07/04/18 0838  BP: (!) 180/70  Pulse: (!) 58  Weight: 96.3 kg  Height: 6\' 3"  (1.905 m)    GEN- The patient is well  appearing, alert and oriented x 3 today.   Head- normocephalic, atraumatic Eyes-  Sclera clear, conjunctiva pink Ears- hearing intact Oropharynx- clear Neck- supple, no JVP Lymph- no cervical lymphadenopathy Lungs- Clear to ausculation bilaterally, normal work of breathing Heart- Regular rate and rhythm, no murmurs, rubs or gallops, PMI not laterally displaced Extremities- no clubbing, cyanosis, or edema MS- no  significant deformity or atrophy Skin- no rash or lesion Psych- euthymic mood, full affect Neuro- strength and sensation are intact  EKG- Sinus brady at 58 bpm qrs int 112 ms, qtc 455 Epic records reviewed  Assessment and Plan: 1. Paroxysmal atrial fibrillation Continue  Flecainide 100 mg bid Continue metoprolol 50 mg daily Continue warfarin, followed by [facility] Patient recently had blood work at PCP office. Will follow up on labs for review.  2.HTN BP is elevated today. Patient admits that he has not taken his medication yet this AM as well as dietary indiscretions. He brings in a BP log from home which shows readings within normal limits. Continue ramipril, hctz Continue  Amlodipine 5 mg a day. May need to consider increasing back to 10 mg. Recheck at next visit. We also discussed lifestyle modifications.  3. Chadsvasc score of at least 4 Continue warfarin  F/u 6 months  Addendum: Labs from 12/2017 reviewed from PCP and show creatinine 0.92, BUN at 30, K+ at 4.7, CBC unremarkable except for plts at 125,000  [place]. Danali Marinos, [facility] [address] [place], [place] [address] [phone]

## 2018-07-05 NOTE — Addendum Note (Signed)
Encounter addended by: Sherran Needs, NP on: 07/05/2018 8:59 AM  Actions taken: Clinical Note Signed

## 2018-07-23 ENCOUNTER — Ambulatory Visit (INDEPENDENT_AMBULATORY_CARE_PROVIDER_SITE_OTHER): Payer: Medicare HMO | Admitting: Pharmacist

## 2018-07-23 DIAGNOSIS — I4891 Unspecified atrial fibrillation: Secondary | ICD-10-CM

## 2018-07-23 DIAGNOSIS — Z7901 Long term (current) use of anticoagulants: Secondary | ICD-10-CM

## 2018-07-23 DIAGNOSIS — Z5181 Encounter for therapeutic drug level monitoring: Secondary | ICD-10-CM | POA: Diagnosis not present

## 2018-07-23 LAB — POCT INR: INR: 2.1 (ref 2.0–3.0)

## 2018-07-23 NOTE — Patient Instructions (Signed)
Description   Continue coumadin 1 tablet daily except 1.5 tablets on Sundays and Wednesdays   Repeat INR in 6 weeks.

## 2018-07-26 ENCOUNTER — Ambulatory Visit: Payer: Medicare HMO | Admitting: Urology

## 2018-07-26 DIAGNOSIS — R972 Elevated prostate specific antigen [PSA]: Secondary | ICD-10-CM

## 2018-07-26 DIAGNOSIS — N401 Enlarged prostate with lower urinary tract symptoms: Secondary | ICD-10-CM

## 2018-07-26 DIAGNOSIS — N481 Balanitis: Secondary | ICD-10-CM | POA: Diagnosis not present

## 2018-07-26 DIAGNOSIS — R3915 Urgency of urination: Secondary | ICD-10-CM

## 2018-08-08 DIAGNOSIS — Z7984 Long term (current) use of oral hypoglycemic drugs: Secondary | ICD-10-CM | POA: Diagnosis not present

## 2018-08-08 DIAGNOSIS — E119 Type 2 diabetes mellitus without complications: Secondary | ICD-10-CM | POA: Diagnosis not present

## 2018-08-08 DIAGNOSIS — H52203 Unspecified astigmatism, bilateral: Secondary | ICD-10-CM | POA: Diagnosis not present

## 2018-08-08 DIAGNOSIS — H524 Presbyopia: Secondary | ICD-10-CM | POA: Diagnosis not present

## 2018-08-08 DIAGNOSIS — H26491 Other secondary cataract, right eye: Secondary | ICD-10-CM | POA: Diagnosis not present

## 2018-08-08 DIAGNOSIS — Z961 Presence of intraocular lens: Secondary | ICD-10-CM | POA: Diagnosis not present

## 2018-08-21 DIAGNOSIS — H26491 Other secondary cataract, right eye: Secondary | ICD-10-CM | POA: Diagnosis not present

## 2018-09-03 ENCOUNTER — Ambulatory Visit (INDEPENDENT_AMBULATORY_CARE_PROVIDER_SITE_OTHER): Payer: Medicare HMO | Admitting: Pharmacist

## 2018-09-03 DIAGNOSIS — Z5181 Encounter for therapeutic drug level monitoring: Secondary | ICD-10-CM | POA: Diagnosis not present

## 2018-09-03 DIAGNOSIS — I4891 Unspecified atrial fibrillation: Secondary | ICD-10-CM

## 2018-09-03 DIAGNOSIS — Z7901 Long term (current) use of anticoagulants: Secondary | ICD-10-CM

## 2018-09-03 LAB — POCT INR: INR: 2.4 (ref 2.0–3.0)

## 2018-09-03 NOTE — Patient Instructions (Signed)
Description   Continue coumadin 1 tablet daily except 1.5 tablets on Sundays and Wednesdays   Repeat INR in 6 weeks.

## 2018-09-06 ENCOUNTER — Other Ambulatory Visit (HOSPITAL_COMMUNITY): Payer: Self-pay | Admitting: Nurse Practitioner

## 2018-09-07 ENCOUNTER — Telehealth: Payer: Self-pay | Admitting: Cardiology

## 2018-09-07 NOTE — Telephone Encounter (Signed)
Pt called saying he thought he was in AF. His HR is 90- B/P 118/80.  I suggested he take an extra 1/2 of Flecainide.  Kerin Ransom PA-C 09/07/2018 12:55 PM

## 2018-09-23 DIAGNOSIS — E1165 Type 2 diabetes mellitus with hyperglycemia: Secondary | ICD-10-CM | POA: Diagnosis not present

## 2018-09-23 DIAGNOSIS — I671 Cerebral aneurysm, nonruptured: Secondary | ICD-10-CM | POA: Diagnosis not present

## 2018-09-23 DIAGNOSIS — D693 Immune thrombocytopenic purpura: Secondary | ICD-10-CM | POA: Diagnosis not present

## 2018-09-23 DIAGNOSIS — G5623 Lesion of ulnar nerve, bilateral upper limbs: Secondary | ICD-10-CM | POA: Diagnosis not present

## 2018-09-23 DIAGNOSIS — Z6826 Body mass index (BMI) 26.0-26.9, adult: Secondary | ICD-10-CM | POA: Diagnosis not present

## 2018-09-23 DIAGNOSIS — H9313 Tinnitus, bilateral: Secondary | ICD-10-CM | POA: Diagnosis not present

## 2018-09-23 DIAGNOSIS — R69 Illness, unspecified: Secondary | ICD-10-CM | POA: Diagnosis not present

## 2018-09-23 DIAGNOSIS — E669 Obesity, unspecified: Secondary | ICD-10-CM | POA: Diagnosis not present

## 2018-09-23 DIAGNOSIS — R42 Dizziness and giddiness: Secondary | ICD-10-CM | POA: Diagnosis not present

## 2018-09-23 DIAGNOSIS — M47812 Spondylosis without myelopathy or radiculopathy, cervical region: Secondary | ICD-10-CM | POA: Diagnosis not present

## 2018-10-02 ENCOUNTER — Other Ambulatory Visit: Payer: Self-pay | Admitting: Internal Medicine

## 2018-10-02 ENCOUNTER — Other Ambulatory Visit (HOSPITAL_COMMUNITY): Payer: Self-pay | Admitting: Internal Medicine

## 2018-10-02 DIAGNOSIS — R42 Dizziness and giddiness: Secondary | ICD-10-CM

## 2018-10-02 DIAGNOSIS — I671 Cerebral aneurysm, nonruptured: Secondary | ICD-10-CM

## 2018-10-03 ENCOUNTER — Other Ambulatory Visit (HOSPITAL_COMMUNITY): Payer: Self-pay | Admitting: Internal Medicine

## 2018-10-03 ENCOUNTER — Other Ambulatory Visit: Payer: Self-pay | Admitting: Internal Medicine

## 2018-10-03 DIAGNOSIS — I671 Cerebral aneurysm, nonruptured: Secondary | ICD-10-CM

## 2018-10-03 DIAGNOSIS — R42 Dizziness and giddiness: Secondary | ICD-10-CM

## 2018-10-14 ENCOUNTER — Other Ambulatory Visit: Payer: Self-pay

## 2018-10-15 ENCOUNTER — Ambulatory Visit (INDEPENDENT_AMBULATORY_CARE_PROVIDER_SITE_OTHER): Payer: Medicare HMO | Admitting: *Deleted

## 2018-10-15 DIAGNOSIS — I4891 Unspecified atrial fibrillation: Secondary | ICD-10-CM | POA: Diagnosis not present

## 2018-10-15 DIAGNOSIS — Z5181 Encounter for therapeutic drug level monitoring: Secondary | ICD-10-CM | POA: Diagnosis not present

## 2018-10-15 LAB — POCT INR: INR: 3.4 — AB (ref 2.0–3.0)

## 2018-10-15 NOTE — Patient Instructions (Signed)
Hold coumadin tonight then resume 1 tablet daily except 1.5 tablets on Sundays and Wednesdays   Repeat INR in 8 weeks.

## 2018-11-01 DIAGNOSIS — R69 Illness, unspecified: Secondary | ICD-10-CM | POA: Diagnosis not present

## 2018-11-04 ENCOUNTER — Other Ambulatory Visit (HOSPITAL_COMMUNITY): Payer: Self-pay | Admitting: Internal Medicine

## 2018-11-04 DIAGNOSIS — M542 Cervicalgia: Secondary | ICD-10-CM

## 2018-11-05 ENCOUNTER — Ambulatory Visit (HOSPITAL_COMMUNITY)
Admission: RE | Admit: 2018-11-05 | Discharge: 2018-11-05 | Disposition: A | Discharge status: Home / Self Care | Payer: Medicare HMO | Source: Ambulatory Visit | Attending: Internal Medicine | Admitting: Internal Medicine

## 2018-11-05 ENCOUNTER — Other Ambulatory Visit: Payer: Self-pay

## 2018-11-05 ENCOUNTER — Other Ambulatory Visit (HOSPITAL_COMMUNITY): Payer: Self-pay | Admitting: Internal Medicine

## 2018-11-05 DIAGNOSIS — M542 Cervicalgia: Secondary | ICD-10-CM | POA: Insufficient documentation

## 2018-11-05 DIAGNOSIS — M50321 Other cervical disc degeneration at C4-C5 level: Secondary | ICD-10-CM | POA: Diagnosis not present

## 2018-11-05 DIAGNOSIS — I671 Cerebral aneurysm, nonruptured: Secondary | ICD-10-CM

## 2018-11-05 DIAGNOSIS — R42 Dizziness and giddiness: Secondary | ICD-10-CM | POA: Diagnosis not present

## 2018-11-05 DIAGNOSIS — M50322 Other cervical disc degeneration at C5-C6 level: Secondary | ICD-10-CM | POA: Diagnosis not present

## 2018-11-06 ENCOUNTER — Telehealth: Payer: Self-pay | Admitting: *Deleted

## 2018-11-06 NOTE — Telephone Encounter (Signed)
Patient called wanting to know if he can take an ASA.

## 2018-11-06 NOTE — Telephone Encounter (Signed)
Spoke with pt and wife.  Has had some dixxiness.  Was sent for CT of Head.  Was told by MD to start taking a Baby ASA along with coumadin.  Told pt that would be fine in this situation.  Has F/U with neurologist at [facility].  Pt will take ASA in am and Warfarin in pm.

## 2018-11-14 DIAGNOSIS — C44311 Basal cell carcinoma of skin of nose: Secondary | ICD-10-CM | POA: Diagnosis not present

## 2018-11-14 DIAGNOSIS — R42 Dizziness and giddiness: Secondary | ICD-10-CM | POA: Insufficient documentation

## 2018-11-14 DIAGNOSIS — I6503 Occlusion and stenosis of bilateral vertebral arteries: Secondary | ICD-10-CM | POA: Diagnosis not present

## 2018-11-14 DIAGNOSIS — Z7982 Long term (current) use of aspirin: Secondary | ICD-10-CM | POA: Diagnosis not present

## 2018-11-14 DIAGNOSIS — I10 Essential (primary) hypertension: Secondary | ICD-10-CM | POA: Diagnosis not present

## 2018-11-14 DIAGNOSIS — Z7984 Long term (current) use of oral hypoglycemic drugs: Secondary | ICD-10-CM | POA: Diagnosis not present

## 2018-11-14 DIAGNOSIS — L308 Other specified dermatitis: Secondary | ICD-10-CM | POA: Diagnosis not present

## 2018-11-14 DIAGNOSIS — E1165 Type 2 diabetes mellitus with hyperglycemia: Secondary | ICD-10-CM | POA: Diagnosis not present

## 2018-11-14 DIAGNOSIS — T148XXA Other injury of unspecified body region, initial encounter: Secondary | ICD-10-CM | POA: Diagnosis not present

## 2018-11-26 DIAGNOSIS — Z13220 Encounter for screening for lipoid disorders: Secondary | ICD-10-CM | POA: Diagnosis not present

## 2018-11-26 DIAGNOSIS — Z79899 Other long term (current) drug therapy: Secondary | ICD-10-CM | POA: Diagnosis not present

## 2018-11-26 DIAGNOSIS — E785 Hyperlipidemia, unspecified: Secondary | ICD-10-CM | POA: Diagnosis not present

## 2018-11-26 DIAGNOSIS — E1140 Type 2 diabetes mellitus with diabetic neuropathy, unspecified: Secondary | ICD-10-CM | POA: Diagnosis not present

## 2018-11-26 DIAGNOSIS — N400 Enlarged prostate without lower urinary tract symptoms: Secondary | ICD-10-CM | POA: Diagnosis not present

## 2018-11-26 LAB — TSH: TSH: 2.02 (ref 0.41–5.90)

## 2018-11-26 LAB — HEMOGLOBIN A1C: Hemoglobin A1C: 15.5

## 2018-11-26 LAB — LIPID PANEL
Cholesterol: 193 (ref 0–200)
HDL: 47 (ref 35–70)
LDL Cholesterol: 111
Triglycerides: 175 — AB (ref 40–160)

## 2018-11-26 LAB — PSA: PSA: 6.1

## 2018-11-26 LAB — BASIC METABOLIC PANEL
BUN: 25 — AB (ref 4–21)
Creatinine: 1 (ref 0.6–1.3)

## 2018-12-05 DIAGNOSIS — D0439 Carcinoma in situ of skin of other parts of face: Secondary | ICD-10-CM | POA: Diagnosis not present

## 2018-12-05 DIAGNOSIS — X32XXXD Exposure to sunlight, subsequent encounter: Secondary | ICD-10-CM | POA: Diagnosis not present

## 2018-12-05 DIAGNOSIS — Z85828 Personal history of other malignant neoplasm of skin: Secondary | ICD-10-CM | POA: Diagnosis not present

## 2018-12-05 DIAGNOSIS — Z08 Encounter for follow-up examination after completed treatment for malignant neoplasm: Secondary | ICD-10-CM | POA: Diagnosis not present

## 2018-12-05 DIAGNOSIS — L570 Actinic keratosis: Secondary | ICD-10-CM | POA: Diagnosis not present

## 2018-12-05 DIAGNOSIS — B078 Other viral warts: Secondary | ICD-10-CM | POA: Diagnosis not present

## 2018-12-11 ENCOUNTER — Ambulatory Visit (INDEPENDENT_AMBULATORY_CARE_PROVIDER_SITE_OTHER): Payer: Medicare HMO | Admitting: *Deleted

## 2018-12-11 DIAGNOSIS — I4891 Unspecified atrial fibrillation: Secondary | ICD-10-CM

## 2018-12-11 DIAGNOSIS — Z5181 Encounter for therapeutic drug level monitoring: Secondary | ICD-10-CM

## 2018-12-11 LAB — POCT INR: INR: 2.6 (ref 2.0–3.0)

## 2018-12-11 NOTE — Patient Instructions (Signed)
Continue coumadin 1 tablet daily except 1.5 tablets on Sundays and Wednesdays   Repeat INR in 8 weeks.  

## 2018-12-12 DIAGNOSIS — E1165 Type 2 diabetes mellitus with hyperglycemia: Secondary | ICD-10-CM | POA: Diagnosis not present

## 2018-12-12 DIAGNOSIS — I7389 Other specified peripheral vascular diseases: Secondary | ICD-10-CM | POA: Diagnosis not present

## 2018-12-12 DIAGNOSIS — Z7982 Long term (current) use of aspirin: Secondary | ICD-10-CM | POA: Diagnosis not present

## 2018-12-12 DIAGNOSIS — Z7901 Long term (current) use of anticoagulants: Secondary | ICD-10-CM | POA: Diagnosis not present

## 2018-12-12 DIAGNOSIS — I6503 Occlusion and stenosis of bilateral vertebral arteries: Secondary | ICD-10-CM | POA: Diagnosis not present

## 2018-12-12 DIAGNOSIS — I10 Essential (primary) hypertension: Secondary | ICD-10-CM | POA: Diagnosis not present

## 2018-12-13 ENCOUNTER — Ambulatory Visit (INDEPENDENT_AMBULATORY_CARE_PROVIDER_SITE_OTHER): Payer: Medicare HMO | Admitting: Urology

## 2018-12-13 DIAGNOSIS — R350 Frequency of micturition: Secondary | ICD-10-CM | POA: Diagnosis not present

## 2018-12-13 DIAGNOSIS — N401 Enlarged prostate with lower urinary tract symptoms: Secondary | ICD-10-CM

## 2018-12-13 DIAGNOSIS — R972 Elevated prostate specific antigen [PSA]: Secondary | ICD-10-CM

## 2018-12-13 DIAGNOSIS — N481 Balanitis: Secondary | ICD-10-CM | POA: Diagnosis not present

## 2019-01-01 NOTE — Progress Notes (Signed)
Patient ID: Jason Alvarez, male   DOB: 01-08-1941, 78 y.o.   MRN: [id number]     Primary Care Physician: Redmond School, MD Referring Physician: Dr. Rayann Heman Primary Cardiologist: Dr Gwenlyn Found Primary EP: Dr Charlyne Petrin is a 78 y.o. male with a h/o DM, HTN, and PAF on flecainide. Patient reports that he has done very well from an afib standpoint with no heart racing symptoms. He does state that he rarely has palpitations which last 1-2 seconds. He also reports that he has an appt with endocrinology for his uncontrolled DM.  Today, he denies symptoms of chest pain, shortness of breath, orthopnea, PND, lower extremity edema, dizziness, presyncope, syncope, or neurologic sequela. The patient is tolerating medications without difficulties and is otherwise without complaint today.   Past Medical History:  Diagnosis Date  . A-fib ([place])    paf with NSVT on MCOT, on coumadin, echo 01/31/08-nl lv, mild aortic valve sclerosis   . Aneurysm ([place])   . Anxiety   . Atrial fibrillation ([place])   . Carotid bruit    doppler 05/17/12-mild plaque with no diameter reduction  . Chronic anticoagulation   . Chronic ITP (idiopathic thrombocytopenia) ([facility]) 04/10/2012   Low grade thrombocytopenia consistent with chronic ITP not in need of therapy.   . DM (diabetes mellitus) ([place])   . Dysrhythmia    afib  . H/O cardiovascular stress test 01/31/08   no ishcemia, ef 61%  . History of kidney stones   . HTN (hypertension)   . Hypertension   . ITP (idiopathic thrombocytopenic purpura)   . Thrombocytopenia ([place])    Past Surgical History:  Procedure Laterality Date  . CIRCUMCISION    . COLONOSCOPY  06/25/2012   Procedure: COLONOSCOPY;  Surgeon: Jamesetta So, MD;  [facility];  Service: Gastroenterology;  Laterality: N/A;  . COLONOSCOPY N/A 01/30/2017   Procedure: COLONOSCOPY;  Surgeon: Aviva Signs, MD;  [facility];  Service: Gastroenterology;  Laterality: N/A;  . KNEE  ARTHROSCOPY     right and left knee/1992 and 2000  . POLYPECTOMY  01/30/2017   Procedure: POLYPECTOMY;  Surgeon: Aviva Signs, MD;  [facility];  Service: Gastroenterology;;  colon  . VASECTOMY      Current Outpatient Medications  Medication Sig Dispense Refill  . acetaminophen (TYLENOL) 500 MG tablet Take 500 mg by mouth daily as needed for moderate pain or headache.    . ALPRAZolam (XANAX) 1 MG tablet Take 1 mg by mouth 3 (three) times daily as needed for anxiety.     Marland Kitchen amLODipine (NORVASC) 10 MG tablet Take 0.5 tablets (5 mg total) by mouth daily. 30 tablet 6  . finasteride (PROSCAR) 5 MG tablet Take 5 mg by mouth daily.    . flecainide (TAMBOCOR) 100 MG tablet Take 1 tablet (100 mg total) by mouth 2 (two) times daily. 180 tablet 2  . glipiZIDE-metformin (METAGLIP) 5-500 MG per tablet Take 1 tablet by mouth daily.     . hydrochlorothiazide (HYDRODIURIL) 25 MG tablet Take 25 mg by mouth daily.      Marland Kitchen HYDROcodone-acetaminophen (NORCO) 10-325 MG tablet Take 1 tablet by mouth daily as needed for severe pain.    . metoprolol succinate (TOPROL-XL) 50 MG 24 hr tablet TAKE ONE (1) TABLET EACH DAY 90 tablet 2  . ramipril (ALTACE) 10 MG capsule Take 1 capsule (10 mg total) by mouth 2 (two) times daily. 180 capsule 2  . triamcinolone cream (KENALOG) 0.1 %  Apply 1 application topically daily as needed for rash.    . warfarin (COUMADIN) 5 MG tablet TAKE 1 TO 1&1/2 TABLET DAILY AS DIRECTED 135 tablet 3   No current facility-administered medications for this visit.     Allergies  Allergen Reactions  . Penicillins Other (See Comments)    Childhood allergy Has patient had a PCN reaction causing immediate rash, facial/tongue/throat swelling, SOB or lightheadedness with hypotension: Unknown Has patient had a PCN reaction causing severe rash involving mucus membranes or skin necrosis: Unknown Has patient had a PCN reaction that required hospitalization: Unknown Has patient had a PCN  reaction occurring within the last 10 years: Unknown If all of the above answers are "NO", then may proceed with Cephalosporin use.    Social History   Socioeconomic History  . Marital status: Married    Spouse name: Not on file  . Number of children: Not on file  . Years of education: Not on file  . Highest education level: Not on file  Occupational History  . Not on file  Social Needs  . Financial resource strain: Not on file  . Food insecurity    Worry: Not on file    Inability: Not on file  . Transportation needs    Medical: Not on file    Non-medical: Not on file  Tobacco Use  . Smoking status: Never Smoker  . Smokeless tobacco: Never Used  Substance and Sexual Activity  . Alcohol use: No  . Drug use: No  . Sexual activity: Not on file  Lifestyle  . Physical activity    Days per week: Not on file    Minutes per session: Not on file  . Stress: Not on file  Relationships  . Social Herbalist on phone: Not on file    Gets together: Not on file    Attends religious service: Not on file    Active member of club or organization: Not on file    Attends meetings of clubs or organizations: Not on file    Relationship status: Not on file  . Intimate partner violence    Fear of current or ex partner: Not on file    Emotionally abused: Not on file    Physically abused: Not on file    Forced sexual activity: Not on file  Other Topics Concern  . Not on file  Social History Narrative  . Not on file    Family History  Problem Relation Age of Onset  . Prostate cancer Father   . Cancer Sister        breast  . Cancer Brother        lung  . Melanoma Mother     ROS- All systems are reviewed and negative except as per the HPI above  Physical Exam: There were no vitals filed for this visit.  GEN- The patient is well appearing, alert and oriented x 3 today.   HEENT-head normocephalic, atraumatic, sclera clear, conjunctiva pink, hearing intact, trachea  midline. Lungs- Clear to ausculation bilaterally, normal work of breathing Heart- Regular rate and rhythm, no murmurs, rubs or gallops  GI- soft, NT, ND, + BS Extremities- no clubbing, cyanosis, or edema MS- no significant deformity or atrophy Skin- no rash or lesion Psych- euthymic mood, full affect Neuro- strength and sensation are intact   EKG- SR HR 59, PVC, 1st degree AV block, QRS 122, QTc 469.  Epic records reviewed  Assessment and Plan: 1. Paroxysmal atrial  fibrillation Patient appears to be maintaining SR. Continue  Flecainide 100 mg bid Continue metoprolol 50 mg daily Continue warfarin, followed by [facility]  This patients CHA2DS2-VASc Score and unadjusted Ischemic Stroke Rate (% per year) is equal to 4.8 % stroke rate/year from a score of 4  Above score calculated as 1 point each if present [CHF, HTN, DM, Vascular=MI/PAD/Aortic Plaque, Age if 65-74, or Male] Above score calculated as 2 points each if present [Age > 75, or Stroke/TIA/TE]   2.HTN Stable, no changes today.  3. DM Stressed importance of good DM control for long term maintenance of SR. Patient states he has an appt with endocrinology on 6/28.   F/u in AF clinic in 6 months.   [facility] [address] [place], [place] [address] [phone]

## 2019-01-02 ENCOUNTER — Other Ambulatory Visit: Payer: Self-pay

## 2019-01-02 ENCOUNTER — Encounter (HOSPITAL_COMMUNITY): Payer: Self-pay | Admitting: Physician Assistant

## 2019-01-02 ENCOUNTER — Ambulatory Visit (HOSPITAL_COMMUNITY)
Admission: RE | Admit: 2019-01-02 | Discharge: 2019-01-02 | Disposition: A | Discharge status: Home / Self Care | Payer: Medicare HMO | Source: Ambulatory Visit | Attending: Physician Assistant | Admitting: Physician Assistant

## 2019-01-02 VITALS — BP 126/58 | HR 59 | Ht 75.0 in | Wt 206.0 lb

## 2019-01-02 DIAGNOSIS — Z79899 Other long term (current) drug therapy: Secondary | ICD-10-CM | POA: Insufficient documentation

## 2019-01-02 DIAGNOSIS — I480 Paroxysmal atrial fibrillation: Secondary | ICD-10-CM

## 2019-01-02 DIAGNOSIS — Z7984 Long term (current) use of oral hypoglycemic drugs: Secondary | ICD-10-CM | POA: Insufficient documentation

## 2019-01-02 DIAGNOSIS — F419 Anxiety disorder, unspecified: Secondary | ICD-10-CM | POA: Insufficient documentation

## 2019-01-02 DIAGNOSIS — D693 Immune thrombocytopenic purpura: Secondary | ICD-10-CM | POA: Diagnosis not present

## 2019-01-02 DIAGNOSIS — Z808 Family history of malignant neoplasm of other organs or systems: Secondary | ICD-10-CM | POA: Diagnosis not present

## 2019-01-02 DIAGNOSIS — E119 Type 2 diabetes mellitus without complications: Secondary | ICD-10-CM | POA: Diagnosis not present

## 2019-01-02 DIAGNOSIS — Z87442 Personal history of urinary calculi: Secondary | ICD-10-CM | POA: Insufficient documentation

## 2019-01-02 DIAGNOSIS — Z7901 Long term (current) use of anticoagulants: Secondary | ICD-10-CM | POA: Insufficient documentation

## 2019-01-02 DIAGNOSIS — Z880 Allergy status to penicillin: Secondary | ICD-10-CM | POA: Diagnosis not present

## 2019-01-02 DIAGNOSIS — Z803 Family history of malignant neoplasm of breast: Secondary | ICD-10-CM | POA: Insufficient documentation

## 2019-01-02 DIAGNOSIS — I10 Essential (primary) hypertension: Secondary | ICD-10-CM | POA: Insufficient documentation

## 2019-01-02 DIAGNOSIS — Z8042 Family history of malignant neoplasm of prostate: Secondary | ICD-10-CM | POA: Insufficient documentation

## 2019-01-02 DIAGNOSIS — Z801 Family history of malignant neoplasm of trachea, bronchus and lung: Secondary | ICD-10-CM | POA: Insufficient documentation

## 2019-01-10 DIAGNOSIS — G894 Chronic pain syndrome: Secondary | ICD-10-CM | POA: Diagnosis not present

## 2019-01-10 DIAGNOSIS — I10 Essential (primary) hypertension: Secondary | ICD-10-CM | POA: Diagnosis not present

## 2019-01-10 DIAGNOSIS — M79669 Pain in unspecified lower leg: Secondary | ICD-10-CM | POA: Diagnosis not present

## 2019-01-10 DIAGNOSIS — R69 Illness, unspecified: Secondary | ICD-10-CM | POA: Diagnosis not present

## 2019-01-10 DIAGNOSIS — Z1389 Encounter for screening for other disorder: Secondary | ICD-10-CM | POA: Diagnosis not present

## 2019-01-10 DIAGNOSIS — I4891 Unspecified atrial fibrillation: Secondary | ICD-10-CM | POA: Diagnosis not present

## 2019-01-10 DIAGNOSIS — Z0000 Encounter for general adult medical examination without abnormal findings: Secondary | ICD-10-CM | POA: Diagnosis not present

## 2019-01-10 DIAGNOSIS — Z6825 Body mass index (BMI) 25.0-25.9, adult: Secondary | ICD-10-CM | POA: Diagnosis not present

## 2019-01-13 ENCOUNTER — Other Ambulatory Visit: Payer: Self-pay

## 2019-01-13 ENCOUNTER — Encounter: Payer: Self-pay | Admitting: "Endocrinology

## 2019-01-13 ENCOUNTER — Ambulatory Visit (INDEPENDENT_AMBULATORY_CARE_PROVIDER_SITE_OTHER): Payer: Medicare HMO | Admitting: "Endocrinology

## 2019-01-13 VITALS — BP 152/66 | HR 53 | Ht 75.0 in | Wt 206.0 lb

## 2019-01-13 DIAGNOSIS — Z5320 Procedure and treatment not carried out because of patient's decision for unspecified reasons: Secondary | ICD-10-CM | POA: Diagnosis not present

## 2019-01-13 DIAGNOSIS — E1159 Type 2 diabetes mellitus with other circulatory complications: Secondary | ICD-10-CM

## 2019-01-13 DIAGNOSIS — E782 Mixed hyperlipidemia: Secondary | ICD-10-CM

## 2019-01-13 DIAGNOSIS — I10 Essential (primary) hypertension: Secondary | ICD-10-CM

## 2019-01-13 NOTE — Progress Notes (Signed)
Endocrinology Consult Note       01/13/2019, 8:58 AM   Subjective:    Patient ID: Jason Alvarez, male    DOB: 09/05/1940.  Jason Alvarez is being seen in consultation for management of currently uncontrolled symptomatic diabetes requested by  Redmond School, MD.   Past Medical History:  Diagnosis Date  . A-fib ([place])    paf with NSVT on MCOT, on coumadin, echo 01/31/08-nl lv, mild aortic valve sclerosis   . Aneurysm ([place])   . Anxiety   . Atrial fibrillation ([place])   . Carotid bruit    doppler 05/17/12-mild plaque with no diameter reduction  . Chronic anticoagulation   . Chronic ITP (idiopathic thrombocytopenia) ([facility]) 04/10/2012   Low grade thrombocytopenia consistent with chronic ITP not in need of therapy.   . DM (diabetes mellitus) ([place])   . Dysrhythmia    afib  . H/O cardiovascular stress test 01/31/08   no ishcemia, ef 61%  . History of kidney stones   . HTN (hypertension)   . Hypertension   . ITP (idiopathic thrombocytopenic purpura)   . Thrombocytopenia ([place])     Past Surgical History:  Procedure Laterality Date  . CIRCUMCISION    . COLONOSCOPY  06/25/2012   Procedure: COLONOSCOPY;  Surgeon: Jamesetta So, MD;  [facility];  Service: Gastroenterology;  Laterality: N/A;  . COLONOSCOPY N/A 01/30/2017   Procedure: COLONOSCOPY;  Surgeon: Aviva Signs, MD;  [facility];  Service: Gastroenterology;  Laterality: N/A;  . KNEE ARTHROSCOPY     right and left knee/1992 and 2000  . POLYPECTOMY  01/30/2017   Procedure: POLYPECTOMY;  Surgeon: Aviva Signs, MD;  [facility];  Service: Gastroenterology;;  colon  . VASECTOMY      Social History   Socioeconomic History  . Marital status: Married    Spouse name: Not on file  . Number of children: Not on file  . Years of education: Not on file  . Highest education level: Not on file  Occupational History  .  Not on file  Social Needs  . Financial resource strain: Not on file  . Food insecurity    Worry: Not on file    Inability: Not on file  . Transportation needs    Medical: Not on file    Non-medical: Not on file  Tobacco Use  . Smoking status: Never Smoker  . Smokeless tobacco: Never Used  Substance and Sexual Activity  . Alcohol use: No  . Drug use: No  . Sexual activity: Not on file  Lifestyle  . Physical activity    Days per week: Not on file    Minutes per session: Not on file  . Stress: Not on file  Relationships  . Social Herbalist on phone: Not on file    Gets together: Not on file    Attends religious service: Not on file    Active member of club or organization: Not on file    Attends meetings of clubs or organizations: Not on file    Relationship status: Not on file  Other Topics  Concern  . Not on file  Social History Narrative  . Not on file    Family History  Problem Relation Age of Onset  . Prostate cancer Father   . Cancer Sister        breast  . Cancer Brother        lung  . Melanoma Mother     Outpatient Encounter Medications as of 01/13/2019  Medication Sig  . amLODipine (NORVASC) 10 MG tablet Take 0.5 tablets (5 mg total) by mouth daily.  . flecainide (TAMBOCOR) 100 MG tablet Take 1 tablet (100 mg total) by mouth 2 (two) times daily.  Marland Kitchen glipiZIDE-metformin (METAGLIP) 5-500 MG per tablet Take 1 tablet by mouth 2 (two) times daily after a meal.  . hydrochlorothiazide (HYDRODIURIL) 25 MG tablet Take 25 mg by mouth daily.    . metoprolol succinate (TOPROL-XL) 50 MG 24 hr tablet TAKE ONE (1) TABLET EACH DAY  . ramipril (ALTACE) 10 MG capsule Take 1 capsule (10 mg total) by mouth 2 (two) times daily.  Marland Kitchen warfarin (COUMADIN) 5 MG tablet TAKE 1 TO 1&1/2 TABLET DAILY AS DIRECTED  . [DISCONTINUED] acetaminophen (TYLENOL) 500 MG tablet Take 500 mg by mouth daily as needed for moderate pain or headache.  . [DISCONTINUED] ALPRAZolam (XANAX) 1 MG  tablet Take 1 mg by mouth 3 (three) times daily as needed for anxiety.   . [DISCONTINUED] aspirin EC 81 MG tablet Take 81 mg by mouth daily.  . [DISCONTINUED] finasteride (PROSCAR) 5 MG tablet Take 5 mg by mouth daily.  . [DISCONTINUED] HYDROcodone-acetaminophen (NORCO) 10-325 MG tablet Take by mouth daily as needed for severe pain.   . [DISCONTINUED] triamcinolone cream (KENALOG) 0.1 % Apply 1 application topically daily as needed for rash.   No facility-administered encounter medications on file as of 01/13/2019.     ALLERGIES: Allergies  Allergen Reactions  . Penicillins Other (See Comments)    Childhood allergy Has patient had a PCN reaction causing immediate rash, facial/tongue/throat swelling, SOB or lightheadedness with hypotension: Unknown Has patient had a PCN reaction causing severe rash involving mucus membranes or skin necrosis: Unknown Has patient had a PCN reaction that required hospitalization: Unknown Has patient had a PCN reaction occurring within the last 10 years: Unknown If all of the above answers are "NO", then may proceed with Cephalosporin use.    VACCINATION STATUS:  There is no immunization history on file for this patient.  Diabetes He presents for his initial diabetic visit. He has type 2 diabetes mellitus. Onset time: He was diagnosed at approximate age of 38 years. His disease course has been worsening. There are no hypoglycemic associated symptoms. Pertinent negatives for hypoglycemia include no confusion, headaches, pallor or seizures. Associated symptoms include foot paresthesias, polydipsia and polyuria. Pertinent negatives for diabetes include no chest pain, no fatigue, no polyphagia and no weakness. There are no hypoglycemic complications. Symptoms are worsening. Diabetic complications include heart disease. (Noncompliance.) Risk factors for coronary artery disease include diabetes mellitus, dyslipidemia, hypertension, male sex and sedentary lifestyle.  Current diabetic treatment includes oral agent (monotherapy) (He is taking glipizide 5/500 mg once a day). His weight is fluctuating minimally. He is following a generally unhealthy diet. When asked about meal planning, he reported none. He has not had a previous visit with a dietitian. He never participates in exercise. His overall blood glucose range is >200 mg/dl. (He brings in a piece of paper which has random blood glucose monitorings between 200-300 mg nightly) An ACE inhibitor/angiotensin II  receptor blocker is being taken. Eye exam is not current.  Hyperlipidemia This is a chronic problem. The current episode started more than 1 year ago. The problem is uncontrolled. Exacerbating diseases include diabetes. Pertinent negatives include no chest pain, myalgias or shortness of breath. He is currently on no antihyperlipidemic treatment. Risk factors for coronary artery disease include dyslipidemia, diabetes mellitus, hypertension, male sex and a sedentary lifestyle.  Hypertension This is a chronic problem. The current episode started more than 1 year ago. The problem is uncontrolled. Pertinent negatives include no chest pain, headaches, neck pain, palpitations or shortness of breath. Risk factors for coronary artery disease include dyslipidemia, diabetes mellitus, male gender and sedentary lifestyle. Past treatments include ACE inhibitors.     Review of Systems  Constitutional: Negative for chills, fatigue, fever and unexpected weight change.  HENT: Negative for dental problem, mouth sores and trouble swallowing.   Eyes: Negative for visual disturbance.  Respiratory: Negative for cough, choking, chest tightness, shortness of breath and wheezing.   Cardiovascular: Negative for chest pain, palpitations and leg swelling.  Gastrointestinal: Negative for abdominal distention, abdominal pain, constipation, diarrhea, nausea and vomiting.  Endocrine: Positive for polydipsia and polyuria. Negative for  polyphagia.  Genitourinary: Negative for dysuria, flank pain, hematuria and urgency.  Musculoskeletal: Negative for back pain, gait problem, myalgias and neck pain.  Skin: Negative for pallor, rash and wound.  Neurological: Negative for seizures, syncope, weakness, numbness and headaches.  Psychiatric/Behavioral: Negative for confusion and dysphoric mood.    Objective:    BP (!) 152/66   Pulse (!) 53   Ht 6\' 3"  (1.905 m)   Wt 206 lb (93.4 kg)   BMI 25.75 kg/m   Wt Readings from Last 3 Encounters:  01/13/19 206 lb (93.4 kg)  01/02/19 206 lb (93.4 kg)  07/04/18 212 lb 6.4 oz (96.3 kg)     Physical Exam Constitutional:      General: He is not in acute distress.    Appearance: He is well-developed.  HENT:     Head: Normocephalic and atraumatic.  Neck:     Musculoskeletal: Normal range of motion and neck supple.     Thyroid: No thyromegaly.     Trachea: No tracheal deviation.  Cardiovascular:     Rate and Rhythm: Normal rate.     Pulses:          Dorsalis pedis pulses are 1+ on the right side and 1+ on the left side.       Posterior tibial pulses are 1+ on the right side and 1+ on the left side.     Heart sounds: Normal heart sounds, S1 normal and S2 normal. No murmur. No gallop.   Pulmonary:     Effort: Pulmonary effort is normal. No respiratory distress.     Breath sounds: No wheezing.  Abdominal:     General: There is no distension.     Tenderness: There is no abdominal tenderness. There is no guarding.  Musculoskeletal:     Right shoulder: He exhibits no swelling and no deformity.  Skin:    General: Skin is warm and dry.     Findings: No rash.     Nails: There is no clubbing.   Neurological:     Mental Status: He is alert and oriented to person, place, and time.     Cranial Nerves: No cranial nerve deficit.     Sensory: No sensory deficit.     Gait: Gait normal.     Deep  Tendon Reflexes: Reflexes are normal and symmetric.  Psychiatric:        Speech: Speech  normal.        Behavior: Behavior normal. Behavior is cooperative.        Thought Content: Thought content normal.        Judgment: Judgment normal.       CMP     Component Value Date/Time   NA 139 09/04/2016 0900   K 4.0 09/04/2016 0900   CL 102 09/04/2016 0900   CO2 28 09/04/2016 0900   GLUCOSE 143 (H) 09/04/2016 0900   BUN 25 (A) 11/26/2018   CREATININE 1.0 11/26/2018   CREATININE 0.81 09/04/2016 0900   CALCIUM 9.3 09/04/2016 0900   PROT 7.3 05/31/2015 0900   ALBUMIN 3.8 05/31/2015 0900   AST 19 05/31/2015 0900   ALT 17 05/31/2015 0900   ALKPHOS 54 05/31/2015 0900   BILITOT 1.1 05/31/2015 0900   GFRNONAA >60 09/04/2016 0900   GFRAA >60 09/04/2016 0900     Diabetic Labs (most recent): Lab Results  Component Value Date   HGBA1C 15.5 11/26/2018     Lipid Panel ( most recent) Lipid Panel     Component Value Date/Time   CHOL 193 11/26/2018   TRIG 175 (A) 11/26/2018   HDL 47 11/26/2018   LDLCALC 111 11/26/2018      Lab Results  Component Value Date   TSH 2.02 11/26/2018   TSH 0.80 03/31/2013   FREET4 0.78 03/31/2013      Assessment & Plan:   1. DM type 2 causing vascular disease ([place]) - Jason Alvarez has currently uncontrolled symptomatic type 2 DM since  78 years of age,  with most recent A1c of 15.5 %. Recent labs reviewed. - I had a long discussion with him about the progressive nature of diabetes and the pathology behind its complications. -his diabetes is complicated by atrial fibrillation, sedentary life and he remains at a high risk for more acute and chronic complications which include CAD, CVA, CKD, retinopathy, and neuropathy. These are all discussed in detail with him.  - I have counseled him on diet management  by adopting a carbohydrate restricted/protein rich diet. - he admits that there is a room for improvement in his food and drink choices. - Suggestion is made for him to avoid simple carbohydrates  from his diet including Cakes, Sweet  Desserts, Ice Cream, Soda (diet and regular), Sweet Tea, Candies, Chips, Cookies, Store Bought Juices, Alcohol in Excess of  1-2 drinks a day, Artificial Sweeteners,  Coffee Creamer, and "Sugar-free" Products. This will help patient to have more stable blood glucose profile and potentially avoid unintended weight gain.  - I encouraged him to switch to  unprocessed or minimally processed complex starch and increased protein intake (animal or plant source), fruits, and vegetables.  - he is advised to stick to a routine mealtimes to eat 3 meals  a day and avoid unnecessary snacks ( to snack only to correct hypoglycemia).   - he will be scheduled with Jearld Fenton, RDN, CDE for individualized diabetes education.  - I have approached him with the following individualized plan to manage diabetes and patient agrees:    Given his current and prevailing glycemic burden with significantly above target glycemic profile and A1c of 15.5%, he is an immediate candidate for insulin treatment.  Only insulin treatment will give him the control he needs.   however tragically patient refused insulin treatment at this time. -He reluctantly accepts  to start strict monitoring of blood glucose 4 times a day-before meals and at bedtime until his next visit in 2 weeks.  - he is encouraged to call clinic for blood glucose levels less than 70 or above 300 mg /dl. -He is advised to increase his glipizide/metformin 5 mg / 500 mg to twice a day after breakfast and after supper.  - Patient specific target  A1c;  LDL, HDL, Triglycerides, and  Waist Circumference were discussed in detail.  2) Blood Pressure /Hypertension:  his blood pressure is uncontrolled to target.   he is advised to continue his current medications including ramipril 10 mg p.o. twice daily, metoprolol 50 mg p.o. daily, hydrochlorothiazide 25 mg p.o. daily, amlodipine 10 mg p.o. daily.    3) Lipids/Hyperlipidemia:   Review of his recent lipid panel  showed uncontrolled  LDL at 111.  Patient is reluctant to go on additional treatment, will be re-approached  for statin treatment on subsequent visits.    4)  Weight/Diet:  Body mass index is 25.75 kg/m.  Is progressively losing weight, likely in catabolic phase.-He is not a candidate for weight loss.  CDE Consult will be initiated . Exercise, and detailed carbohydrates information provided  -  detailed on discharge instructions.  5) Chronic Care/Health Maintenance:  -he  is on ACEI/ARB  medications and  is encouraged to initiate and continue to follow up with Ophthalmology, Dentist,  Podiatrist at least yearly or according to recommendations, and advised to   stay away from smoking. I have recommended yearly flu vaccine and pneumonia vaccine at least every 5 years; moderate intensity exercise for up to 150 minutes weekly; and  sleep for at least 7 hours a day.  - he is  advised to maintain close follow up with Redmond School, MD for primary care needs, as well as his other providers for optimal and coordinated care.  - Time spent with the patient: 45 minutes, of which >50% was spent in obtaining information about his symptoms, reviewing his previous labs/studies, evaluations, and treatments, counseling him about his currently uncontrolled, complicated type 2 diabetes, hypertension, hyperlipidemia, and developing plans for long term treatment based on the latest standards of care/guidelines.  Please refer to " Patient Self Inventory" in the Media  tab for reviewed elements of pertinent patient history.  Jason Alvarez participated in the discussions, expressed understanding, and voiced agreement with the above plans (except that he did not accept insulin treatment which he needs badly).  All questions were answered to his satisfaction. he is encouraged to contact clinic should he have any questions or concerns prior to his return visit.  Follow up plan: - Return in about 2 weeks (around  01/27/2019) for Follow up with [facility] and Logs Only - no Labs.  Glade Lloyd, MD [facility] [address] [place], [place] [address] Phone: [phone]  Fax: [phone]    01/13/2019, 8:58 AM  This note was partially dictated with voice recognition software. Similar sounding words can be transcribed inadequately or may not  be corrected upon review.

## 2019-01-13 NOTE — Patient Instructions (Signed)

## 2019-01-14 ENCOUNTER — Other Ambulatory Visit (HOSPITAL_COMMUNITY): Payer: Self-pay | Admitting: Internal Medicine

## 2019-01-14 DIAGNOSIS — I739 Peripheral vascular disease, unspecified: Secondary | ICD-10-CM

## 2019-01-21 ENCOUNTER — Encounter (HOSPITAL_COMMUNITY): Payer: Self-pay

## 2019-01-21 ENCOUNTER — Ambulatory Visit (HOSPITAL_COMMUNITY): Payer: Medicare HMO

## 2019-01-25 DIAGNOSIS — R69 Illness, unspecified: Secondary | ICD-10-CM | POA: Diagnosis not present

## 2019-01-29 ENCOUNTER — Telehealth: Payer: Self-pay

## 2019-01-29 NOTE — Telephone Encounter (Signed)
Called and left a msg that the pt missed their coumadin appt and wanna get them scheduled back with lisa at [facility] sooner than september

## 2019-01-30 ENCOUNTER — Ambulatory Visit (INDEPENDENT_AMBULATORY_CARE_PROVIDER_SITE_OTHER): Payer: Medicare HMO | Admitting: *Deleted

## 2019-01-30 DIAGNOSIS — I4891 Unspecified atrial fibrillation: Secondary | ICD-10-CM | POA: Diagnosis not present

## 2019-01-30 DIAGNOSIS — Z5181 Encounter for therapeutic drug level monitoring: Secondary | ICD-10-CM

## 2019-01-30 LAB — POCT INR: INR: 2.1 (ref 2.0–3.0)

## 2019-01-30 NOTE — Patient Instructions (Signed)
Continue coumadin 1 tablet daily except 1.5 tablets on Sundays and Wednesdays   Repeat INR in 8 weeks.  

## 2019-01-31 ENCOUNTER — Other Ambulatory Visit: Payer: Self-pay

## 2019-01-31 ENCOUNTER — Ambulatory Visit (INDEPENDENT_AMBULATORY_CARE_PROVIDER_SITE_OTHER): Payer: Medicare HMO | Admitting: "Endocrinology

## 2019-01-31 ENCOUNTER — Encounter: Payer: Self-pay | Admitting: "Endocrinology

## 2019-01-31 DIAGNOSIS — E1159 Type 2 diabetes mellitus with other circulatory complications: Secondary | ICD-10-CM

## 2019-01-31 DIAGNOSIS — I10 Essential (primary) hypertension: Secondary | ICD-10-CM | POA: Diagnosis not present

## 2019-01-31 DIAGNOSIS — E782 Mixed hyperlipidemia: Secondary | ICD-10-CM

## 2019-01-31 NOTE — Progress Notes (Signed)
01/31/2019, 4:40 PM                                                    Endocrinology Telehealth Visit Follow up Note -During COVID -19 Pandemic  This visit type was conducted due to national recommendations for restrictions regarding the COVID-19 Pandemic  in an effort to limit this patient's exposure and mitigate transmission of the corona virus.  Due to his co-morbid illnesses, Jason Alvarez is at  moderate to high risk for complications without adequate follow up.  This format is felt to be most appropriate for him at this time.  I connected with this patient on 01/31/2019   by telephone and verified that I am speaking with the correct person using two identifiers. Jason Alvarez, [birth year]. he has verbally consented to this visit. All issues noted in this document were discussed and addressed. The format was not optimal for physical exam.    Subjective:    Patient ID: Jason Alvarez, male    DOB: 05-Mar-1941.  Jason Alvarez is being in telehealth for follow-up in the management of currently uncontrolled symptomatic diabetes requested by  Redmond School, MD.   Past Medical History:  Diagnosis Date  . A-fib ([place])    paf with NSVT on MCOT, on coumadin, echo 01/31/08-nl lv, mild aortic valve sclerosis   . Aneurysm ([place])   . Anxiety   . Atrial fibrillation ([place])   . Carotid bruit    doppler 05/17/12-mild plaque with no diameter reduction  . Chronic anticoagulation   . Chronic ITP (idiopathic thrombocytopenia) ([facility]) 04/10/2012   Low grade thrombocytopenia consistent with chronic ITP not in need of therapy.   . DM (diabetes mellitus) ([place])   . Dysrhythmia    afib  . H/O cardiovascular stress test 01/31/08   no ishcemia, ef 61%  . History of kidney stones   . HTN (hypertension)   . Hypertension   . ITP (idiopathic thrombocytopenic purpura)   . Thrombocytopenia ([place])     Past Surgical History:   Procedure Laterality Date  . CIRCUMCISION    . COLONOSCOPY  06/25/2012   Procedure: COLONOSCOPY;  Surgeon: Jamesetta So, MD;  [facility];  Service: Gastroenterology;  Laterality: N/A;  . COLONOSCOPY N/A 01/30/2017   Procedure: COLONOSCOPY;  Surgeon: Aviva Signs, MD;  Location: [facility];  Service: Gastroenterology;  Laterality: N/A;  . KNEE ARTHROSCOPY     right and left knee/1992 and 2000  . POLYPECTOMY  01/30/2017   Procedure: POLYPECTOMY;  Surgeon: Aviva Signs, MD;  Location: [facility];  Service: Gastroenterology;;  colon  . VASECTOMY      Social History   Socioeconomic History  . Marital status: Married    Spouse name: Not on file  . Number of children: Not on file  . Years of education: Not on file  . Highest education level: Not on file  Occupational History  . Not on file  Social Needs  . Financial resource strain: Not on file  . Food insecurity    Worry: Not on file    Inability: Not on file  . Transportation needs    Medical: Not on file    Non-medical: Not on file  Tobacco Use  . Smoking status: Never Smoker  . Smokeless tobacco: Never Used  Substance and Sexual Activity  . Alcohol use: No  . Drug use: No  . Sexual activity: Not on file  Lifestyle  . Physical activity    Days per week: Not on file    Minutes per session: Not on file  . Stress: Not on file  Relationships  . Social Herbalist on phone: Not on file    Gets together: Not on file    Attends religious service: Not on file    Active member of club or organization: Not on file    Attends meetings of clubs or organizations: Not on file    Relationship status: Not on file  Other Topics Concern  . Not on file  Social History Narrative  . Not on file    Family History  Problem Relation Age of Onset  . Prostate cancer Father   . Cancer Sister        breast  . Cancer Brother        lung  . Melanoma Mother     Outpatient Encounter Medications as of  01/31/2019  Medication Sig  . amLODipine (NORVASC) 10 MG tablet Take 0.5 tablets (5 mg total) by mouth daily.  . flecainide (TAMBOCOR) 100 MG tablet Take 1 tablet (100 mg total) by mouth 2 (two) times daily.  Marland Kitchen glipiZIDE-metformin (METAGLIP) 5-500 MG per tablet Take 1 tablet by mouth 2 (two) times daily after a meal.  . hydrochlorothiazide (HYDRODIURIL) 25 MG tablet Take 25 mg by mouth daily.    . metoprolol succinate (TOPROL-XL) 50 MG 24 hr tablet TAKE ONE (1) TABLET EACH DAY  . ramipril (ALTACE) 10 MG capsule Take 1 capsule (10 mg total) by mouth 2 (two) times daily.  Marland Kitchen warfarin (COUMADIN) 5 MG tablet TAKE 1 TO 1&1/2 TABLET DAILY AS DIRECTED   No facility-administered encounter medications on file as of 01/31/2019.     ALLERGIES: Allergies  Allergen Reactions  . Penicillins Other (See Comments)    Childhood allergy Has patient had a PCN reaction causing immediate rash, facial/tongue/throat swelling, SOB or lightheadedness with hypotension: Unknown Has patient had a PCN reaction causing severe rash involving mucus membranes or skin necrosis: Unknown Has patient had a PCN reaction that required hospitalization: Unknown Has patient had a PCN reaction occurring within the last 10 years: Unknown If all of the above answers are "NO", then may proceed with Cephalosporin use.    VACCINATION STATUS:  There is no immunization history on file for this patient.  Diabetes He presents for his follow-up diabetic visit. He has type 2 diabetes mellitus. Onset time: He was diagnosed at approximate age of 52 years. His disease course has been improving. There are no hypoglycemic associated symptoms. Pertinent negatives for hypoglycemia include no confusion, headaches, pallor or seizures. Associated symptoms include foot paresthesias. Pertinent negatives for diabetes include no chest pain, no fatigue, no polydipsia, no polyphagia, no polyuria and no weakness. There are no hypoglycemic complications.  Symptoms are improving. Diabetic complications include heart disease. (Noncompliance.) Risk factors for coronary artery disease include diabetes mellitus, dyslipidemia, hypertension, male sex and sedentary lifestyle. Current diabetic treatment includes oral  agent (monotherapy) (He is taking glipizide 5/500 mg once a day). His weight is fluctuating minimally. He is following a generally unhealthy diet. When asked about meal planning, he reported none. He has not had a previous visit with a dietitian. He never participates in exercise. His breakfast blood glucose range is generally 130-140 mg/dl. His lunch blood glucose range is generally 130-140 mg/dl. His dinner blood glucose range is generally 130-140 mg/dl. His bedtime blood glucose range is generally 130-140 mg/dl. His overall blood glucose range is 130-140 mg/dl. An ACE inhibitor/angiotensin II receptor blocker is being taken. Eye exam is not current.  Hyperlipidemia This is a chronic problem. The current episode started more than 1 year ago. The problem is uncontrolled. Exacerbating diseases include diabetes. Pertinent negatives include no chest pain, myalgias or shortness of breath. He is currently on no antihyperlipidemic treatment. Risk factors for coronary artery disease include dyslipidemia, diabetes mellitus, hypertension, male sex and a sedentary lifestyle.  Hypertension This is a chronic problem. The current episode started more than 1 year ago. The problem is uncontrolled. Pertinent negatives include no chest pain, headaches, neck pain, palpitations or shortness of breath. Risk factors for coronary artery disease include dyslipidemia, diabetes mellitus, male gender and sedentary lifestyle. Past treatments include ACE inhibitors.     Review of Systems  Constitutional: Negative for chills, fatigue, fever and unexpected weight change.  HENT: Negative for dental problem, mouth sores and trouble swallowing.   Eyes: Negative for visual  disturbance.  Respiratory: Negative for cough, choking, chest tightness, shortness of breath and wheezing.   Cardiovascular: Negative for chest pain, palpitations and leg swelling.  Gastrointestinal: Negative for abdominal distention, abdominal pain, constipation, diarrhea, nausea and vomiting.  Endocrine: Negative for polydipsia, polyphagia and polyuria.  Genitourinary: Negative for dysuria, flank pain, hematuria and urgency.  Musculoskeletal: Negative for back pain, gait problem, myalgias and neck pain.  Skin: Negative for pallor, rash and wound.  Neurological: Negative for seizures, syncope, weakness, numbness and headaches.  Psychiatric/Behavioral: Negative for confusion and dysphoric mood.    Objective:    There were no vitals taken for this visit.  Wt Readings from Last 3 Encounters:  01/13/19 206 lb (93.4 kg)  01/02/19 206 lb (93.4 kg)  07/04/18 212 lb 6.4 oz (96.3 kg)       CMP     Component Value Date/Time   NA 139 09/04/2016 0900   K 4.0 09/04/2016 0900   CL 102 09/04/2016 0900   CO2 28 09/04/2016 0900   GLUCOSE 143 (H) 09/04/2016 0900   BUN 25 (A) 11/26/2018   CREATININE 1.0 11/26/2018   CREATININE 0.81 09/04/2016 0900   CALCIUM 9.3 09/04/2016 0900   PROT 7.3 05/31/2015 0900   ALBUMIN 3.8 05/31/2015 0900   AST 19 05/31/2015 0900   ALT 17 05/31/2015 0900   ALKPHOS 54 05/31/2015 0900   BILITOT 1.1 05/31/2015 0900   GFRNONAA >60 09/04/2016 0900   GFRAA >60 09/04/2016 0900     Diabetic Labs (most recent): Lab Results  Component Value Date   HGBA1C 15.5 11/26/2018     Lipid Panel ( most recent) Lipid Panel     Component Value Date/Time   CHOL 193 11/26/2018   TRIG 175 (A) 11/26/2018   HDL 47 11/26/2018   LDLCALC 111 11/26/2018      Lab Results  Component Value Date   TSH 2.02 11/26/2018   TSH 0.80 03/31/2013   FREET4 0.78 03/31/2013      Assessment & Plan:   1.  DM type 2 causing vascular disease ([place]) - Jason Alvarez has currently  uncontrolled symptomatic type 2 DM since  78 years of age. -Also A1c was 15.5% prior to his last visit, since he made significant adjustment in his diet since last visit, he is reporting glycemic profile near target both fasting and postprandial.  Recent labs reviewed.  -his diabetes is complicated by atrial fibrillation, sedentary life and he remains at a high risk for more acute and chronic complications which include CAD, CVA, CKD, retinopathy, and neuropathy. These are all discussed in detail with him.  - I have counseled him on diet management  by adopting a carbohydrate restricted/protein rich diet.  - he  admits there is a room for improvement in his diet and drink choices. -  Suggestion is made for him to avoid simple carbohydrates  from his diet including Cakes, Sweet Desserts / Pastries, Ice Cream, Soda (diet and regular), Sweet Tea, Candies, Chips, Cookies, Sweet Pastries,  Store Bought Juices, Alcohol in Excess of  1-2 drinks a day, Artificial Sweeteners, Coffee Creamer, and "Sugar-free" Products. This will help patient to have stable blood glucose profile and potentially avoid unintended weight gain.   - I encouraged him to switch to  unprocessed or minimally processed complex starch and increased protein intake (animal or plant source), fruits, and vegetables.  - he is advised to stick to a routine mealtimes to eat 3 meals  a day and avoid unnecessary snacks ( to snack only to correct hypoglycemia).   - he will be scheduled with Jearld Fenton, RDN, CDE for individualized diabetes education.  - I have approached him with the following individualized plan to manage diabetes and patient agrees:    -He is motivated to control his diabetes without insulin, achieved reasonable glycemic profile without insulin although his recent A1c was extremely high at 15.5%.    He is encouraged for what he has done.  He will continue to monitor blood glucose twice a day before breakfast and at  bedtime.  He is not offered insulin treatment at this time. - he is encouraged to call clinic for blood glucose levels less than 70 or above 300 mg /dl. -He is advised to continue his glipizide/metformin 5 mg / 500 mg to twice a day after breakfast and after supper.   - Patient specific target  A1c;  LDL, HDL, Triglycerides, and  Waist Circumference were discussed in detail.  2) Blood Pressure /Hypertension:   he is advised to home monitor blood pressure and report if > 140/90 on 2 separate readings.   he is advised to continue his current medications including ramipril 10 mg p.o. twice daily, metoprolol 50 mg p.o. daily, hydrochlorothiazide 25 mg p.o. daily, amlodipine 10 mg p.o. daily.    3) Lipids/Hyperlipidemia:   Review of his recent lipid panel showed uncontrolled  LDL at 111.  Patient is reluctant to go on additional treatment, will be re-approached  for statin treatment on subsequent visits.   4)  Weight/Diet: -He is not a candidate for weight loss.  CDE Consult will be initiated . Exercise, and detailed carbohydrates information provided  -  detailed on discharge instructions.  5) Chronic Care/Health Maintenance:  -he  is on ACEI/ARB  medications and  is encouraged to initiate and continue to follow up with Ophthalmology, Dentist,  Podiatrist at least yearly or according to recommendations, and advised to   stay away from smoking. I have recommended yearly flu vaccine and pneumonia vaccine at least  every 5 years; moderate intensity exercise for up to 150 minutes weekly; and  sleep for at least 7 hours a day.  - he is  advised to maintain close follow up with Redmond School, MD for primary care needs, as well as his other providers for optimal and coordinated care.  - Patient Care Time Today:  25 min, of which >50% was spent in reviewing his  current and  previous labs/studies, glucose logs, previous treatments, and medications doses and developing a plan for long-term care based on  the latest recommendations for standards of care.  Jason Alvarez participated in the discussions, expressed understanding, and voiced agreement with the above plans.  All questions were answered to his satisfaction. he is encouraged to contact clinic should he have any questions or concerns prior to his return visit.   Follow up plan: - Return in about 9 weeks (around 04/04/2019) for Follow up with Pre-visit Labs, Meter, and Logs.  Glade Lloyd, MD [facility] [address] [place], [place] [address] Phone: [phone]  Fax: [phone]    01/31/2019, 4:40 PM  This note was partially dictated with voice recognition software. Similar sounding words can be transcribed inadequately or may not  be corrected upon review.

## 2019-02-03 DIAGNOSIS — D696 Thrombocytopenia, unspecified: Secondary | ICD-10-CM | POA: Diagnosis not present

## 2019-02-03 DIAGNOSIS — E119 Type 2 diabetes mellitus without complications: Secondary | ICD-10-CM | POA: Diagnosis not present

## 2019-02-06 DIAGNOSIS — D649 Anemia, unspecified: Secondary | ICD-10-CM | POA: Diagnosis not present

## 2019-02-24 DIAGNOSIS — R69 Illness, unspecified: Secondary | ICD-10-CM | POA: Diagnosis not present

## 2019-03-04 DIAGNOSIS — R69 Illness, unspecified: Secondary | ICD-10-CM | POA: Diagnosis not present

## 2019-03-13 ENCOUNTER — Ambulatory Visit: Payer: Medicare HMO | Admitting: Nutrition

## 2019-03-17 DIAGNOSIS — E1165 Type 2 diabetes mellitus with hyperglycemia: Secondary | ICD-10-CM | POA: Diagnosis not present

## 2019-03-17 DIAGNOSIS — I10 Essential (primary) hypertension: Secondary | ICD-10-CM | POA: Diagnosis not present

## 2019-03-17 DIAGNOSIS — G894 Chronic pain syndrome: Secondary | ICD-10-CM | POA: Diagnosis not present

## 2019-03-17 DIAGNOSIS — M1990 Unspecified osteoarthritis, unspecified site: Secondary | ICD-10-CM | POA: Diagnosis not present

## 2019-03-20 ENCOUNTER — Other Ambulatory Visit (HOSPITAL_COMMUNITY): Payer: Self-pay | Admitting: *Deleted

## 2019-03-20 MED ORDER — RAMIPRIL 10 MG PO CAPS: 10.0000 mg | ORAL_CAPSULE | Freq: Two times a day (BID) | ORAL | 2 refills | Status: DC

## 2019-03-20 MED ORDER — FLECAINIDE ACETATE 100 MG PO TABS: 100.0000 mg | ORAL_TABLET | Freq: Two times a day (BID) | ORAL | 2 refills | Status: DC

## 2019-04-02 ENCOUNTER — Ambulatory Visit (INDEPENDENT_AMBULATORY_CARE_PROVIDER_SITE_OTHER): Payer: Medicare HMO | Admitting: *Deleted

## 2019-04-02 ENCOUNTER — Other Ambulatory Visit: Payer: Self-pay

## 2019-04-02 DIAGNOSIS — I4891 Unspecified atrial fibrillation: Secondary | ICD-10-CM

## 2019-04-02 DIAGNOSIS — Z5181 Encounter for therapeutic drug level monitoring: Secondary | ICD-10-CM

## 2019-04-02 LAB — POCT INR: INR: 2.2 (ref 2.0–3.0)

## 2019-04-02 NOTE — Patient Instructions (Signed)
Continue coumadin 1 tablet daily except 1.5 tablets on Sundays and Wednesdays   Repeat INR in 8 weeks.  

## 2019-04-08 ENCOUNTER — Other Ambulatory Visit: Payer: Self-pay | Admitting: "Endocrinology

## 2019-04-08 ENCOUNTER — Ambulatory Visit: Payer: Medicare HMO | Admitting: "Endocrinology

## 2019-04-08 DIAGNOSIS — R5383 Other fatigue: Secondary | ICD-10-CM | POA: Diagnosis not present

## 2019-04-08 DIAGNOSIS — Z13220 Encounter for screening for lipoid disorders: Secondary | ICD-10-CM | POA: Diagnosis not present

## 2019-04-08 DIAGNOSIS — E1140 Type 2 diabetes mellitus with diabetic neuropathy, unspecified: Secondary | ICD-10-CM | POA: Diagnosis not present

## 2019-04-08 DIAGNOSIS — E1165 Type 2 diabetes mellitus with hyperglycemia: Secondary | ICD-10-CM | POA: Diagnosis not present

## 2019-04-08 DIAGNOSIS — E1159 Type 2 diabetes mellitus with other circulatory complications: Secondary | ICD-10-CM | POA: Diagnosis not present

## 2019-04-09 ENCOUNTER — Other Ambulatory Visit: Payer: Self-pay | Admitting: Internal Medicine

## 2019-04-09 LAB — COMPREHENSIVE METABOLIC PANEL
ALT: 7 IU/L (ref 0–44)
AST: 15 IU/L (ref 0–40)
Albumin/Globulin Ratio: 1.5 (ref 1.2–2.2)
Albumin: 3.8 g/dL (ref 3.7–4.7)
Alkaline Phosphatase: 57 IU/L (ref 39–117)
BUN/Creatinine Ratio: 29 — ABNORMAL HIGH (ref 10–24)
BUN: 34 mg/dL — ABNORMAL HIGH (ref 8–27)
Bilirubin Total: 0.4 mg/dL (ref 0.0–1.2)
CO2: 24 mmol/L (ref 20–29)
Calcium: 9.1 mg/dL (ref 8.6–10.2)
Chloride: 102 mmol/L (ref 96–106)
Creatinine, Ser: 1.18 mg/dL (ref 0.76–1.27)
GFR calc Af Amer: 68 mL/min/{1.73_m2} (ref >59)
GFR calc non Af Amer: 59 mL/min/{1.73_m2} — ABNORMAL LOW (ref >59)
Globulin, Total: 2.6 g/dL (ref 1.5–4.5)
Glucose: 104 mg/dL — ABNORMAL HIGH (ref 65–99)
Potassium: 4.1 mmol/L (ref 3.5–5.2)
Sodium: 140 mmol/L (ref 134–144)
Total Protein: 6.4 g/dL (ref 6.0–8.5)

## 2019-04-09 LAB — LIPID PANEL W/O CHOL/HDL RATIO
Cholesterol, Total: 163 mg/dL (ref 100–199)
HDL: 45 mg/dL (ref >39)
LDL Chol Calc (NIH): 105 mg/dL — ABNORMAL HIGH (ref 0–99)
Triglycerides: 65 mg/dL (ref 0–149)
VLDL Cholesterol Cal: 13 mg/dL (ref 5–40)

## 2019-04-09 LAB — HGB A1C W/O EAG: Hgb A1c MFr Bld: 7.4 % — ABNORMAL HIGH (ref 4.8–5.6)

## 2019-04-09 LAB — TSH: TSH: 2.01 u[IU]/mL (ref 0.450–4.500)

## 2019-04-09 LAB — MICROALBUMIN, URINE: Microalbumin, Urine: 126.1 ug/mL

## 2019-04-09 LAB — VITAMIN D 25 HYDROXY (VIT D DEFICIENCY, FRACTURES): Vit D, 25-Hydroxy: 27 ng/mL — ABNORMAL LOW (ref 30.0–100.0)

## 2019-04-09 LAB — T4, FREE: Free T4: 1.19 ng/dL (ref 0.82–1.77)

## 2019-04-16 DIAGNOSIS — I4891 Unspecified atrial fibrillation: Secondary | ICD-10-CM | POA: Diagnosis not present

## 2019-04-16 DIAGNOSIS — E1140 Type 2 diabetes mellitus with diabetic neuropathy, unspecified: Secondary | ICD-10-CM | POA: Diagnosis not present

## 2019-04-16 DIAGNOSIS — I10 Essential (primary) hypertension: Secondary | ICD-10-CM | POA: Diagnosis not present

## 2019-04-16 DIAGNOSIS — N400 Enlarged prostate without lower urinary tract symptoms: Secondary | ICD-10-CM | POA: Diagnosis not present

## 2019-04-23 DIAGNOSIS — R42 Dizziness and giddiness: Secondary | ICD-10-CM | POA: Diagnosis not present

## 2019-04-23 DIAGNOSIS — I6503 Occlusion and stenosis of bilateral vertebral arteries: Secondary | ICD-10-CM | POA: Diagnosis not present

## 2019-04-24 DIAGNOSIS — R69 Illness, unspecified: Secondary | ICD-10-CM | POA: Diagnosis not present

## 2019-05-05 DIAGNOSIS — M25562 Pain in left knee: Secondary | ICD-10-CM | POA: Diagnosis not present

## 2019-05-05 DIAGNOSIS — M1712 Unilateral primary osteoarthritis, left knee: Secondary | ICD-10-CM | POA: Diagnosis not present

## 2019-05-11 IMAGING — CT CT HEAD WITHOUT CONTRAST
3 of 4 series · 15 of 47 positions shown, 18 images · non-contrast
Comparison: None.

CLINICAL DATA: 78-year-old presenting 3-4 month history of unsteady
gait, dizziness and giddiness. Current history of atrial
fibrillation requiring chronic anticoagulation.

EXAM:
CT HEAD WITHOUT CONTRAST
TECHNIQUE: Contiguous axial images were obtained from the base of the skull
through the vertex without intravenous contrast.

[Series 2: head trauma wo · axial · 0.42mm/px · z∈[+98,+218]mm · 9 of 30 slices shown, 12 images]
[im 3/30  brain]
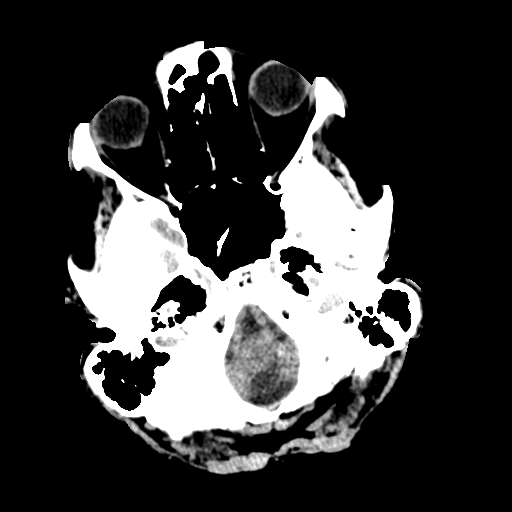
[im 3/30  bone]
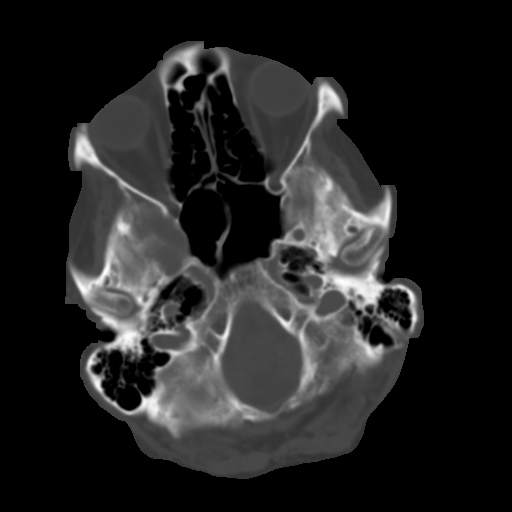
[im 7/30  brain]
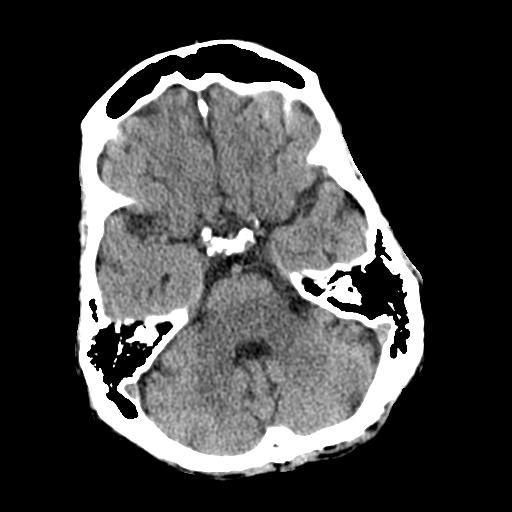
[im 9/30  brain]
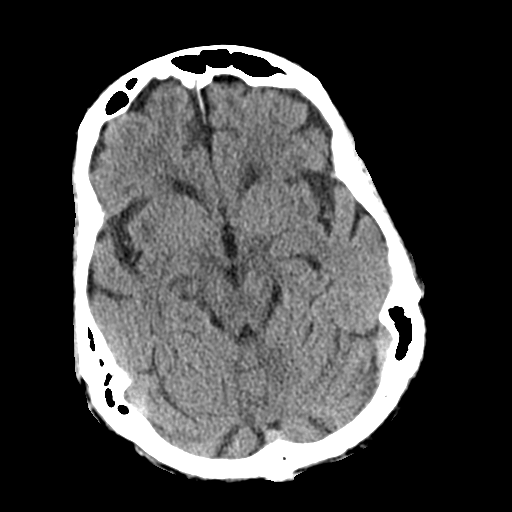
[im 13/30  brain]
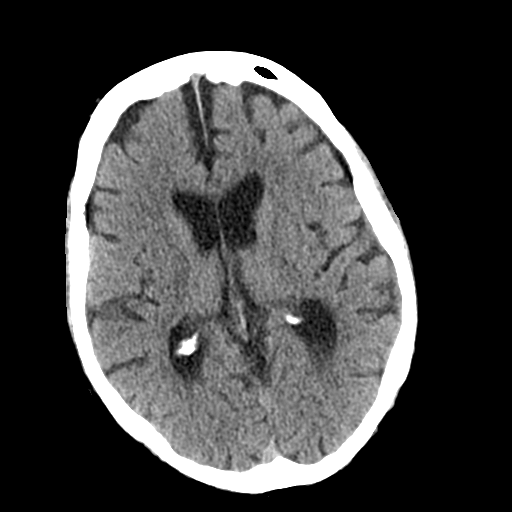
[im 15/30  brain]
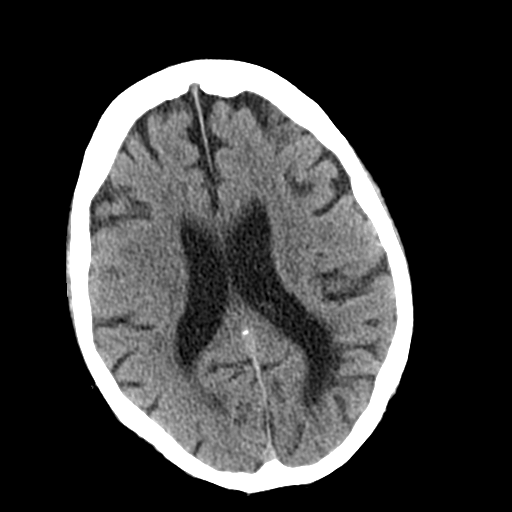
[im 15/30  bone]
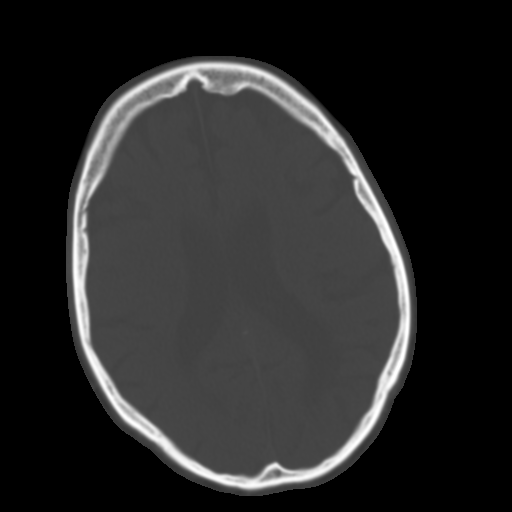
[im 17/30  brain]
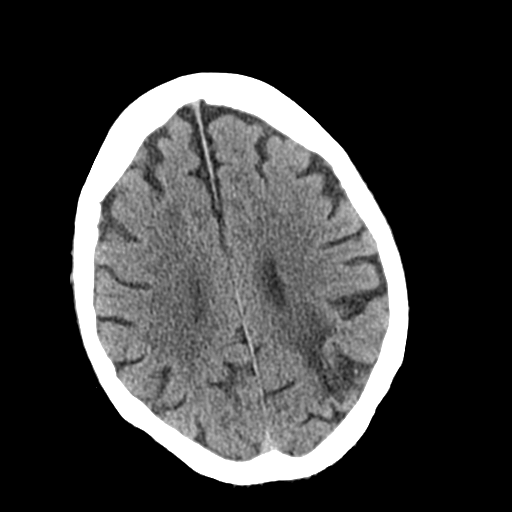
[im 21/30  brain]
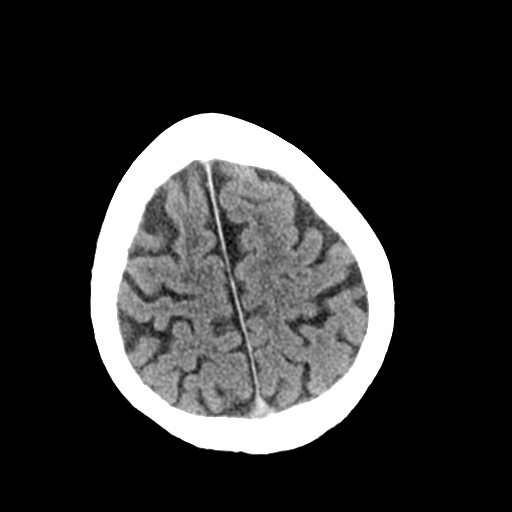
[im 23/30  brain]
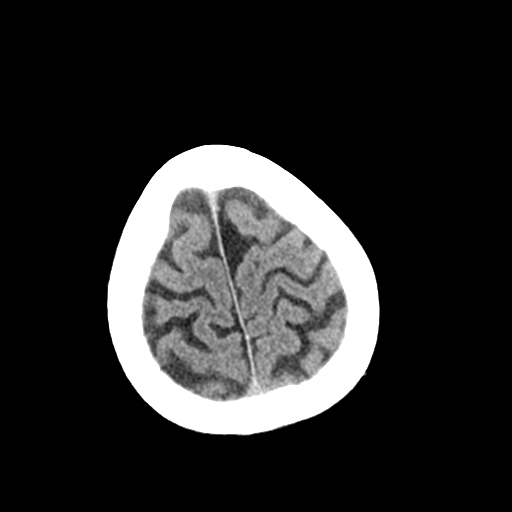
[im 27/30  brain]
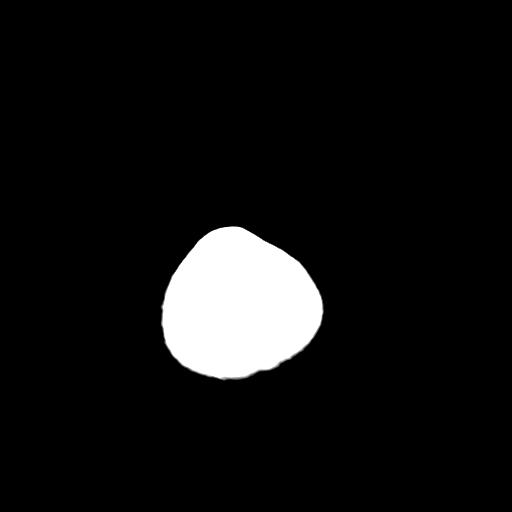
[im 27/30  bone]
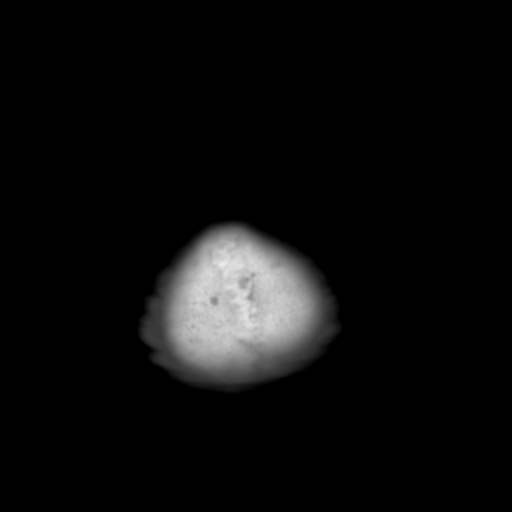

[Series 4: coronal soft tissue · coronal · 0.30mm/px · 3 of 78 slices shown]
[im 26/78  brain]
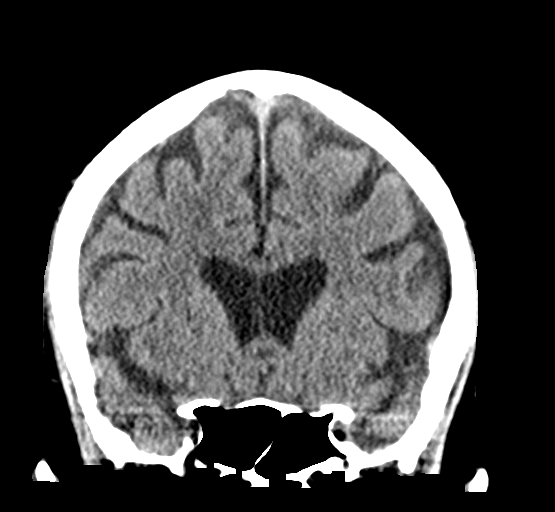
[im 35/78  brain]
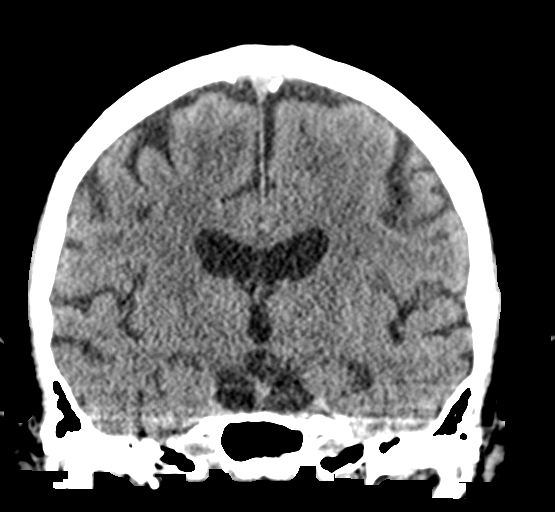
[im 43/78  brain]
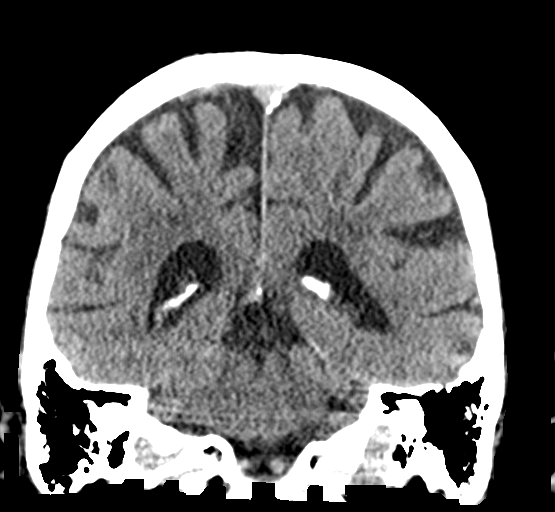

[Series 5: sagittal soft tissue · sagittal · 0.32mm/px · 3 of 54 slices shown]
[im 18/54  brain]
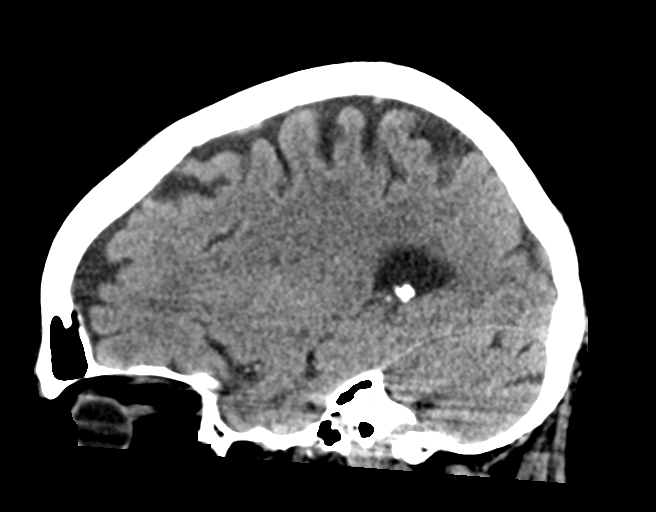
[im 27/54  brain]
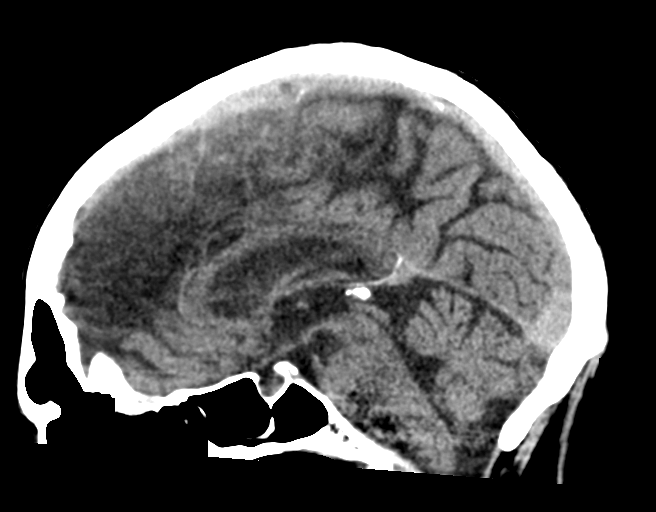
[im 36/54  brain]
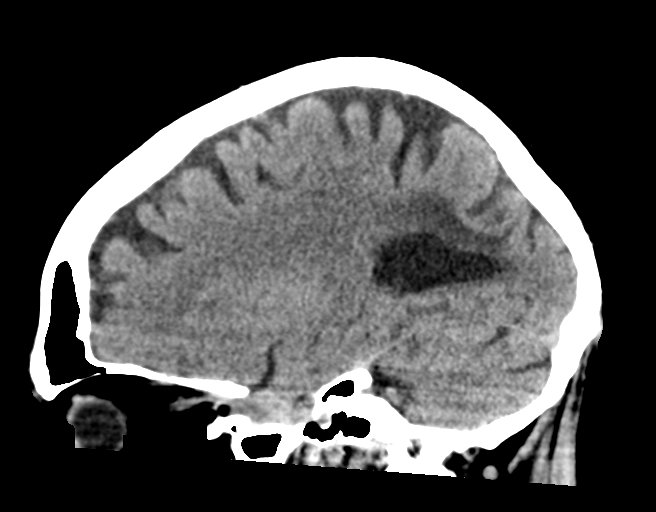

[15 of 47 positions shown; findings below may reference images not displayed]

FINDINGS: Brain: Moderate age related cortical, deep and cerebellar atrophy.
Moderate changes of small vessel disease of the white matter. Focal
remote stroke involving the deep white matter of the LEFT POSTERIOR
parietal lobe with associated punctate dystrophic calcifications. No
mass lesion. No midline shift. No acute hemorrhage or hematoma. No
extra-axial fluid collections. No evidence of acute infarction.

Vascular: Moderate to severe BILATERAL carotid siphon and BILATERAL
vertebral artery atherosclerosis. No hyperdense vessel.

Skull: No skull fracture or other focal osseous abnormality
involving the skull.

Sinuses/Orbits: Visualized paranasal sinuses, bilateral mastoid air
cells and bilateral middle ear cavities well-aerated. Visualized
orbits and globes normal in appearance.

Other: None.
IMPRESSION: 1. No acute intracranial abnormality.
2. Moderate age related generalized atrophy and moderate chronic
microvascular ischemic changes of the white matter.
3. Remote focal stroke involving the deep white matter of the left
POSTERIOR parietal lobe.

## 2019-05-11 IMAGING — DX CERVICAL SPINE - 2-3 VIEW
3 series · 3 of 3 positions shown · non-contrast
Comparison: None.

CLINICAL DATA: Three-month history of cervical neck pain which
acutely worsened after the patient ran over a hole in his yard while
on a lawnmower recently.

EXAM:
CERVICAL SPINE - 2-3 VIEW

[c-spine lat]
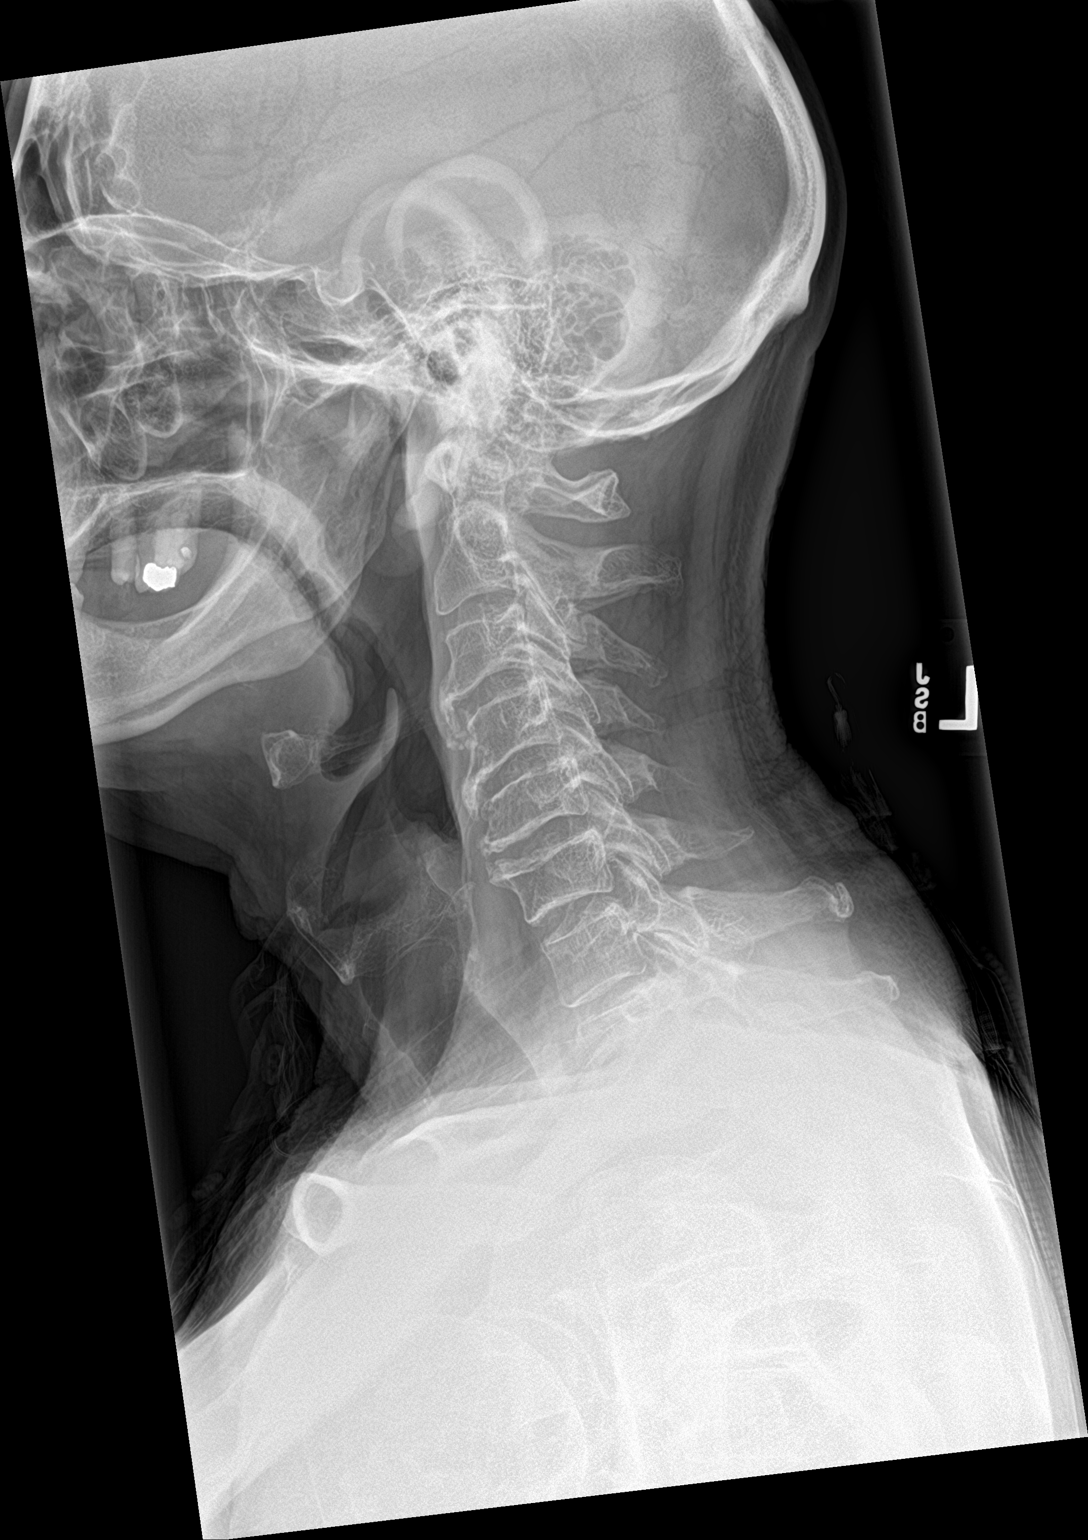

[c-spine ap]
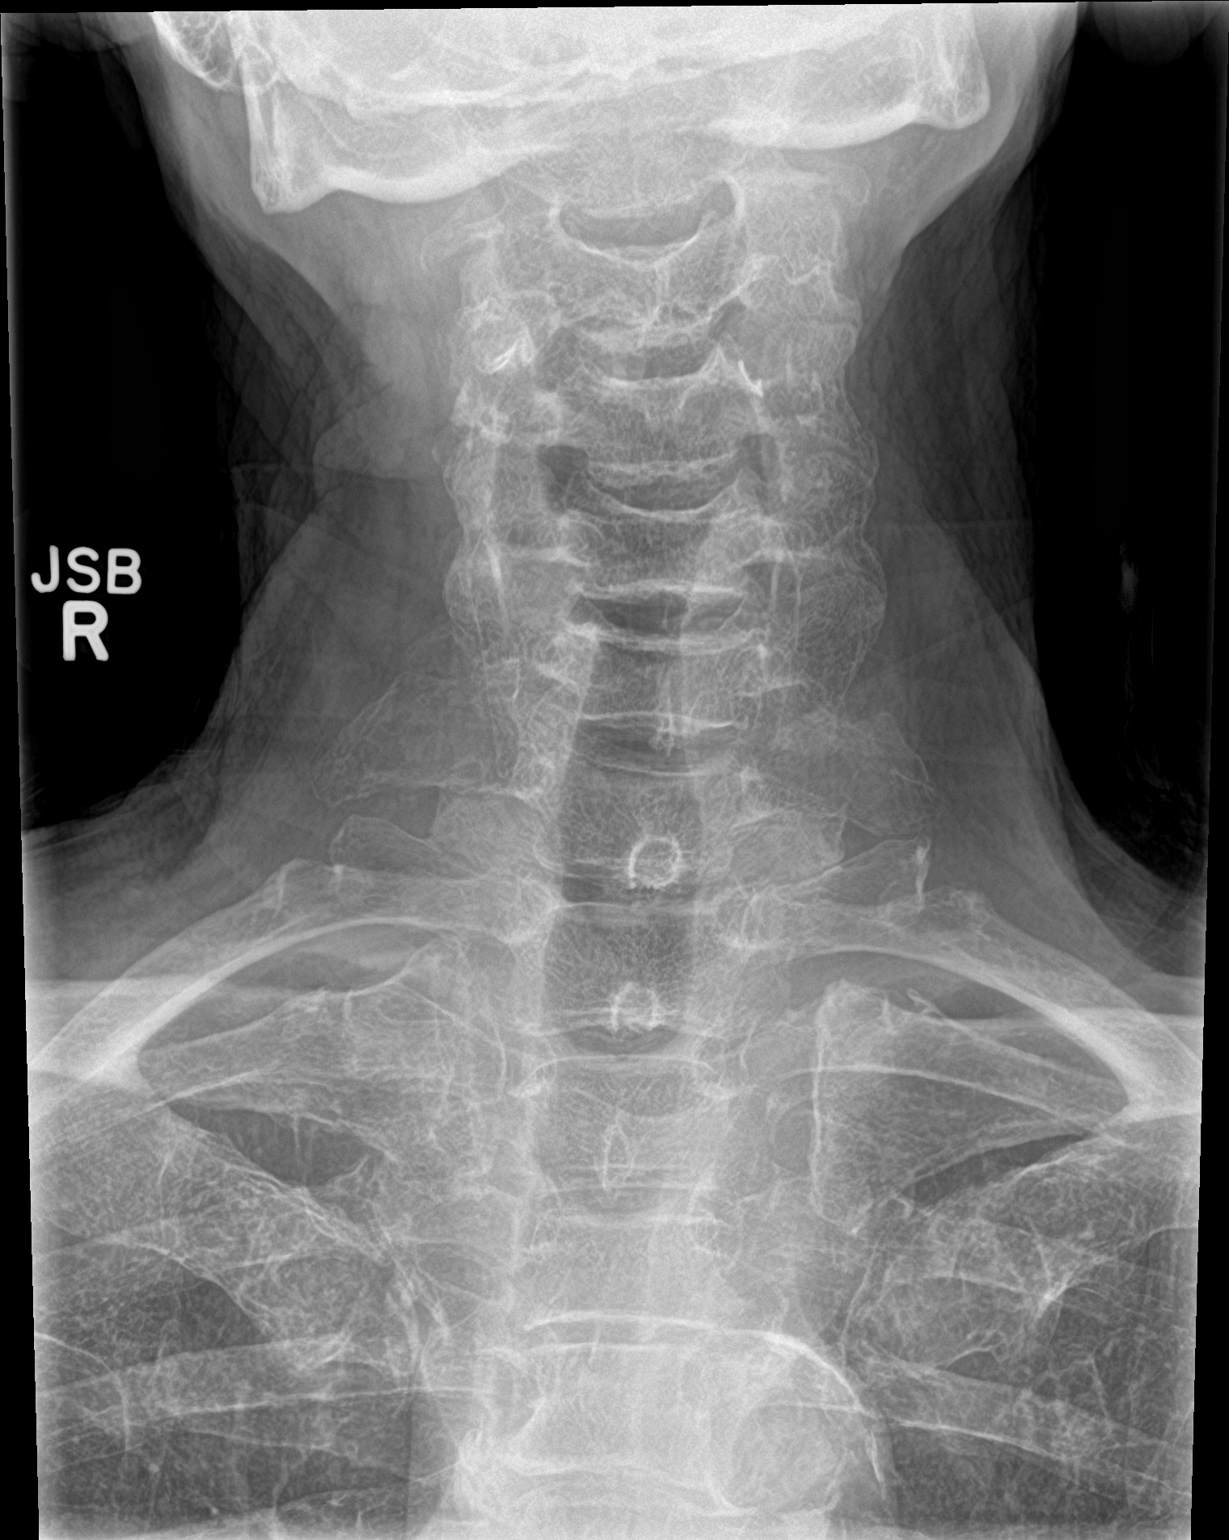

[c-spine open mouth]
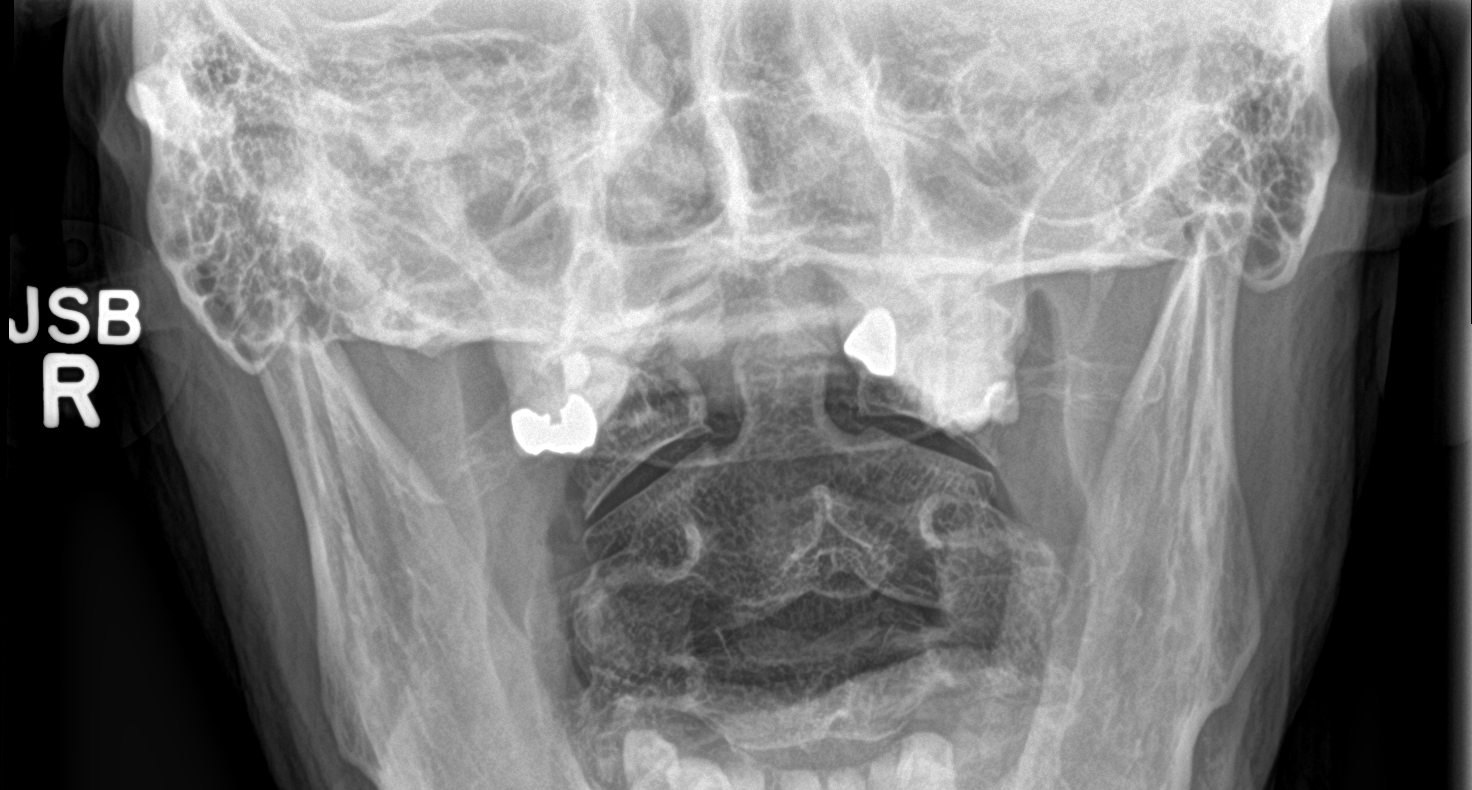

[3 of 3 positions shown; findings below may reference images not displayed]

FINDINGS: Straightening of the usual cervical lordosis. Anatomic POSTERIOR
alignment. No visible fractures. Mild disc space narrowing at C4-5
and C5-6. Ossification in the ANTERIOR longitudinal ligament at
C3-4, C4-5 and C5-6. Normal prevertebral soft tissues. Osseous
demineralization. Small BILATERAL cervical ribs at C7. No static
evidence of instability.
IMPRESSION: Straightening of the usual lordosis which may reflect positioning
and/or spasm. Mild degenerative disc disease at C4-5 and C5-6.

## 2019-05-17 DIAGNOSIS — I10 Essential (primary) hypertension: Secondary | ICD-10-CM | POA: Diagnosis not present

## 2019-05-17 DIAGNOSIS — E119 Type 2 diabetes mellitus without complications: Secondary | ICD-10-CM | POA: Diagnosis not present

## 2019-05-17 DIAGNOSIS — I4891 Unspecified atrial fibrillation: Secondary | ICD-10-CM | POA: Diagnosis not present

## 2019-05-19 ENCOUNTER — Telehealth: Payer: Self-pay | Admitting: Pharmacist

## 2019-05-19 NOTE — Telephone Encounter (Signed)
Spoke with pt to discuss changing from warfarin to [place] therapy due to better efficacy and safety data, as well as decreased monitoring especially in the setting of COVID-19 pandemic. Pt prefers to continue on warfarin at this time.

## 2019-05-26 ENCOUNTER — Other Ambulatory Visit: Payer: Self-pay

## 2019-05-26 ENCOUNTER — Ambulatory Visit (INDEPENDENT_AMBULATORY_CARE_PROVIDER_SITE_OTHER): Payer: Medicare HMO | Admitting: *Deleted

## 2019-05-26 DIAGNOSIS — Z5181 Encounter for therapeutic drug level monitoring: Secondary | ICD-10-CM

## 2019-05-26 DIAGNOSIS — I4891 Unspecified atrial fibrillation: Secondary | ICD-10-CM | POA: Diagnosis not present

## 2019-05-26 LAB — POCT INR: INR: 2.1 (ref 2.0–3.0)

## 2019-05-26 NOTE — Patient Instructions (Signed)
Continue coumadin 1 tablet daily except 1.5 tablets on Sundays and Wednesdays   Repeat INR in 8 weeks.  

## 2019-05-28 ENCOUNTER — Other Ambulatory Visit (HOSPITAL_COMMUNITY): Payer: Self-pay | Admitting: *Deleted

## 2019-05-28 MED ORDER — METOPROLOL SUCCINATE ER 50 MG PO TB24: ORAL_TABLET | ORAL | 2 refills | Status: DC

## 2019-05-30 ENCOUNTER — Other Ambulatory Visit: Payer: Self-pay

## 2019-05-30 ENCOUNTER — Ambulatory Visit (INDEPENDENT_AMBULATORY_CARE_PROVIDER_SITE_OTHER): Payer: Medicare HMO | Admitting: Urology

## 2019-05-30 DIAGNOSIS — R3912 Poor urinary stream: Secondary | ICD-10-CM | POA: Diagnosis not present

## 2019-05-30 DIAGNOSIS — N481 Balanitis: Secondary | ICD-10-CM | POA: Diagnosis not present

## 2019-05-30 DIAGNOSIS — R972 Elevated prostate specific antigen [PSA]: Secondary | ICD-10-CM

## 2019-05-30 DIAGNOSIS — N401 Enlarged prostate with lower urinary tract symptoms: Secondary | ICD-10-CM | POA: Diagnosis not present

## 2019-06-04 ENCOUNTER — Ambulatory Visit (HOSPITAL_COMMUNITY)
Admission: RE | Admit: 2019-06-04 | Discharge: 2019-06-04 | Disposition: A | Discharge status: Home / Self Care | Payer: Medicare HMO | Source: Ambulatory Visit | Attending: Nurse Practitioner | Admitting: Nurse Practitioner

## 2019-06-04 ENCOUNTER — Other Ambulatory Visit: Payer: Self-pay

## 2019-06-04 ENCOUNTER — Encounter (HOSPITAL_COMMUNITY): Payer: Self-pay | Admitting: Nurse Practitioner

## 2019-06-04 VITALS — BP 178/60 | HR 55 | Ht 75.0 in | Wt 210.4 lb

## 2019-06-04 DIAGNOSIS — I10 Essential (primary) hypertension: Secondary | ICD-10-CM | POA: Insufficient documentation

## 2019-06-04 DIAGNOSIS — Z7901 Long term (current) use of anticoagulants: Secondary | ICD-10-CM | POA: Insufficient documentation

## 2019-06-04 DIAGNOSIS — D693 Immune thrombocytopenic purpura: Secondary | ICD-10-CM | POA: Insufficient documentation

## 2019-06-04 DIAGNOSIS — F419 Anxiety disorder, unspecified: Secondary | ICD-10-CM | POA: Insufficient documentation

## 2019-06-04 DIAGNOSIS — Z7984 Long term (current) use of oral hypoglycemic drugs: Secondary | ICD-10-CM | POA: Diagnosis not present

## 2019-06-04 DIAGNOSIS — D6869 Other thrombophilia: Secondary | ICD-10-CM | POA: Diagnosis not present

## 2019-06-04 DIAGNOSIS — E119 Type 2 diabetes mellitus without complications: Secondary | ICD-10-CM | POA: Insufficient documentation

## 2019-06-04 DIAGNOSIS — Z880 Allergy status to penicillin: Secondary | ICD-10-CM | POA: Insufficient documentation

## 2019-06-04 DIAGNOSIS — I480 Paroxysmal atrial fibrillation: Secondary | ICD-10-CM

## 2019-06-04 DIAGNOSIS — Z79899 Other long term (current) drug therapy: Secondary | ICD-10-CM | POA: Insufficient documentation

## 2019-06-04 DIAGNOSIS — R69 Illness, unspecified: Secondary | ICD-10-CM | POA: Diagnosis not present

## 2019-06-04 MED ORDER — AMLODIPINE BESYLATE 10 MG PO TABS: 10.0000 mg | ORAL_TABLET | Freq: Every day | ORAL | 6 refills | Status: DC

## 2019-06-04 NOTE — Progress Notes (Addendum)
Patient ID: Jason Alvarez, male   DOB: 03-19-1941, 78 y.o.   MRN: [id number]     Primary Care Physician: Redmond School, MD Referring Physician: Dr. Charlyne Petrin is a 78 y.o. male with a h/o PAF on flecainide. He reports that he had a mechanical fall one month ago  but did not suffer any significant injury, SInce then he feels that he may have had more fluttering but it is very difficult for him to  describe.Marland Kitchen   His blood pressure is also elevated but on recheck 140 /50. He shows me his BP readings many of which are showing systolics of [id number]. He was on this dose in the past but he lowered as he felt BP readings were getting too low. He tells me that he was started on a baby asa a few months ago by his PCP, he did not know why. I see in Care everywhere that he was seen in October for dizziness by Neurology at [facility] and was found to have vestibular stenosis so may be 2/2 to this. He feels he is bruising more with also being on warfarin, CHA2DS2VASc score of at least 4.  He also admits that his blood glucose not too long ago got up to 15. He made some dietary changes and brought  It down to 7 but has slipped again in his dietary control.  Today, + for  Palpitations, negative for  chest pain, shortness of breath, orthopnea, PND, lower extremity edema, dizziness, presyncope, syncope, or neurologic sequela. The patient is tolerating medications without difficulties and is otherwise without complaint today.   Past Medical History:  Diagnosis Date  . A-fib ([place])    paf with NSVT on MCOT, on coumadin, echo 01/31/08-nl lv, mild aortic valve sclerosis   . Aneurysm ([place])   . Anxiety   . Atrial fibrillation ([place])   . Carotid bruit    doppler 05/17/12-mild plaque with no diameter reduction  . Chronic anticoagulation   . Chronic ITP (idiopathic thrombocytopenia) (HCC) 04/10/2012   Low grade thrombocytopenia consistent with chronic ITP not in need of therapy.   . DM (diabetes mellitus) ([place])    . Dysrhythmia    afib  . H/O cardiovascular stress test 01/31/08   no ishcemia, ef 61%  . History of kidney stones   . HTN (hypertension)   . Hypertension   . ITP (idiopathic thrombocytopenic purpura)   . Thrombocytopenia ([place])    Past Surgical History:  Procedure Laterality Date  . CIRCUMCISION    . COLONOSCOPY  06/25/2012   Procedure: COLONOSCOPY;  Surgeon: Jamesetta So, MD;  [facility];  Service: Gastroenterology;  Laterality: N/A;  . COLONOSCOPY N/A 01/30/2017   Procedure: COLONOSCOPY;  Surgeon: Aviva Signs, MD;  Location: [facility];  Service: Gastroenterology;  Laterality: N/A;  . KNEE ARTHROSCOPY     right and left knee/1992 and 2000  . POLYPECTOMY  01/30/2017   Procedure: POLYPECTOMY;  Surgeon: Aviva Signs, MD;  Location: [facility];  Service: Gastroenterology;;  colon  . VASECTOMY      Current Outpatient Medications  Medication Sig Dispense Refill  . ALPRAZolam (XANAX) 1 MG tablet     . amLODipine (NORVASC) 10 MG tablet Take 1 tablet (10 mg total) by mouth daily. 30 tablet 6  . finasteride (PROSCAR) 5 MG tablet Take 5 mg by mouth daily.    . flecainide (TAMBOCOR) 100 MG tablet Take 1 tablet (100 mg total) by mouth 2 (two)  times daily. 180 tablet 2  . glipiZIDE-metformin (METAGLIP) 5-500 MG per tablet Take 1 tablet by mouth 2 (two) times daily after a meal.    . hydrochlorothiazide (HYDRODIURIL) 25 MG tablet Take 25 mg by mouth daily.      . metoprolol succinate (TOPROL-XL) 50 MG 24 hr tablet TAKE ONE (1) TABLET EACH DAY 90 tablet 2  . OneTouch Delica Lancets 99991111 MISC     . ONETOUCH VERIO test strip 1 each 2 (two) times daily.    . ramipril (ALTACE) 10 MG capsule Take 1 capsule (10 mg total) by mouth 2 (two) times daily. 180 capsule 2  . warfarin (COUMADIN) 5 MG tablet TAKE 1 TO 1&1/2 TABLET DAILY AS DIRECTED 120 tablet 1   No current facility-administered medications for this encounter.     Allergies  Allergen Reactions  . Penicillins Other  (See Comments)    Childhood allergy Has patient had a PCN reaction causing immediate rash, facial/tongue/throat swelling, SOB or lightheadedness with hypotension: Unknown Has patient had a PCN reaction causing severe rash involving mucus membranes or skin necrosis: Unknown Has patient had a PCN reaction that required hospitalization: Unknown Has patient had a PCN reaction occurring within the last 10 years: Unknown If all of the above answers are "NO", then may proceed with Cephalosporin use.    Social History   Socioeconomic History  . Marital status: Married    Spouse name: Not on file  . Number of children: Not on file  . Years of education: Not on file  . Highest education level: Not on file  Occupational History  . Not on file  Social Needs  . Financial resource strain: Not on file  . Food insecurity    Worry: Not on file    Inability: Not on file  . Transportation needs    Medical: Not on file    Non-medical: Not on file  Tobacco Use  . Smoking status: Never Smoker  . Smokeless tobacco: Never Used  Substance and Sexual Activity  . Alcohol use: No  . Drug use: No  . Sexual activity: Not on file  Lifestyle  . Physical activity    Days per week: Not on file    Minutes per session: Not on file  . Stress: Not on file  Relationships  . Social Herbalist on phone: Not on file    Gets together: Not on file    Attends religious service: Not on file    Active member of club or organization: Not on file    Attends meetings of clubs or organizations: Not on file    Relationship status: Not on file  . Intimate partner violence    Fear of current or ex partner: Not on file    Emotionally abused: Not on file    Physically abused: Not on file    Forced sexual activity: Not on file  Other Topics Concern  . Not on file  Social History Narrative  . Not on file    Family History  Problem Relation Age of Onset  . Prostate cancer Father   . Cancer Sister         breast  . Cancer Brother        lung  . Melanoma Mother     ROS- All systems are reviewed and negative except as per the HPI above  Physical Exam: Vitals:   06/04/19 0828  BP: (!) 178/60  Pulse: (!) 55  Weight: 95.4  kg  Height: 6\' 3"  (1.905 m)    GEN- The patient is well appearing, alert and oriented x 3 today.   Head- normocephalic, atraumatic Eyes-  Sclera clear, conjunctiva pink Ears- hearing intact Oropharynx- clear Neck- supple, no JVP Lymph- no cervical lymphadenopathy Lungs- Clear to ausculation bilaterally, normal work of breathing Heart- Regular rate and rhythm, no murmurs, rubs or gallops, PMI not laterally displaced Extremities- no clubbing, cyanosis, or edema MS- no significant deformity or atrophy Skin- no rash or lesion Psych- euthymic mood, full affect Neuro- strength and sensation are intact  EKG- Sinus brady at 55 bpm, pr int 242 ms  qrs int 112 ms, qtc 455 Epic records reviewed  Assessment and Plan: 1. Paroxysmal atrial fibrillation ? Increase in burden Pt has a hard time describing episodes One week ZIO patch  Continue  Flecainide 100 mg bid Continue metoprolol 50 mg daily Continue warfarin, followed by [facility] office  C/o of bruising more with addition of asa, contact PCP who started pt on ASA to see  can reduce dose  2.HTN BP is elevated today better on recheck but has elevated readings at home as well Increase amlodipine to 10 mg daily and continue with BP checks at home Continue ramipril, hctz We also discussed reducing salt  intake  3. Chadsvasc score of at least 4 Continue warfarin  F/u 6 months   I will call pt results of monitor and any need to change antiarrythmic.  Geroge Baseman Severa Jeremiah, [facility] [address] [place], [place] [address] [phone]

## 2019-06-04 NOTE — Patient Instructions (Signed)
Increase amlodipine to 10mg  once a day  Wear heart monitor for 7 days -- take off and mail in on November 25th   Call your primary care regarding aspirin and see if you can stop it.

## 2019-06-16 DIAGNOSIS — E119 Type 2 diabetes mellitus without complications: Secondary | ICD-10-CM | POA: Diagnosis not present

## 2019-06-16 DIAGNOSIS — I480 Paroxysmal atrial fibrillation: Secondary | ICD-10-CM | POA: Diagnosis not present

## 2019-06-16 DIAGNOSIS — I10 Essential (primary) hypertension: Secondary | ICD-10-CM | POA: Diagnosis not present

## 2019-06-18 DIAGNOSIS — I480 Paroxysmal atrial fibrillation: Secondary | ICD-10-CM | POA: Diagnosis not present

## 2019-06-24 ENCOUNTER — Telehealth (HOSPITAL_COMMUNITY): Payer: Self-pay | Admitting: *Deleted

## 2019-06-24 NOTE — Telephone Encounter (Signed)
-----   Message from Sherran Needs, NP sent at 06/24/2019  9:20 AM EST ----- Please let pt know that afib burden was low at 2%, longest episode of afib was 2 hours and it was rate controlled so I think flecainide is still working ok.

## 2019-06-24 NOTE — Telephone Encounter (Signed)
Patient notified of results.

## 2019-06-27 DIAGNOSIS — R69 Illness, unspecified: Secondary | ICD-10-CM | POA: Diagnosis not present

## 2019-06-30 ENCOUNTER — Ambulatory Visit (HOSPITAL_COMMUNITY): Payer: Medicare HMO | Admitting: Nurse Practitioner

## 2019-07-17 DIAGNOSIS — E119 Type 2 diabetes mellitus without complications: Secondary | ICD-10-CM | POA: Diagnosis not present

## 2019-07-17 DIAGNOSIS — I480 Paroxysmal atrial fibrillation: Secondary | ICD-10-CM | POA: Diagnosis not present

## 2019-07-17 DIAGNOSIS — I10 Essential (primary) hypertension: Secondary | ICD-10-CM | POA: Diagnosis not present

## 2019-07-21 ENCOUNTER — Other Ambulatory Visit: Payer: Self-pay

## 2019-07-21 ENCOUNTER — Ambulatory Visit (INDEPENDENT_AMBULATORY_CARE_PROVIDER_SITE_OTHER): Payer: Medicare HMO | Admitting: *Deleted

## 2019-07-21 DIAGNOSIS — Z5181 Encounter for therapeutic drug level monitoring: Secondary | ICD-10-CM | POA: Diagnosis not present

## 2019-07-21 DIAGNOSIS — I4891 Unspecified atrial fibrillation: Secondary | ICD-10-CM

## 2019-07-21 LAB — POCT INR: INR: 2.4 (ref 2.0–3.0)

## 2019-07-21 NOTE — Patient Instructions (Signed)
Continue coumadin 1 tablet daily except 1.5 tablets on Sundays and Wednesdays   Repeat INR in 8 weeks.  

## 2019-07-28 DIAGNOSIS — M4727 Other spondylosis with radiculopathy, lumbosacral region: Secondary | ICD-10-CM | POA: Diagnosis not present

## 2019-07-28 DIAGNOSIS — M25551 Pain in right hip: Secondary | ICD-10-CM | POA: Diagnosis not present

## 2019-08-08 DIAGNOSIS — R69 Illness, unspecified: Secondary | ICD-10-CM | POA: Diagnosis not present

## 2019-09-08 ENCOUNTER — Other Ambulatory Visit (HOSPITAL_COMMUNITY): Payer: Self-pay | Admitting: *Deleted

## 2019-09-08 MED ORDER — FLECAINIDE ACETATE 100 MG PO TABS: 100.0000 mg | ORAL_TABLET | Freq: Two times a day (BID) | ORAL | 2 refills | Status: DC

## 2019-09-08 MED ORDER — RAMIPRIL 10 MG PO CAPS: 10.0000 mg | ORAL_CAPSULE | Freq: Two times a day (BID) | ORAL | 2 refills | Status: DC

## 2019-09-15 ENCOUNTER — Ambulatory Visit (INDEPENDENT_AMBULATORY_CARE_PROVIDER_SITE_OTHER): Payer: Medicare HMO | Admitting: *Deleted

## 2019-09-15 ENCOUNTER — Other Ambulatory Visit: Payer: Self-pay

## 2019-09-15 DIAGNOSIS — Z5181 Encounter for therapeutic drug level monitoring: Secondary | ICD-10-CM | POA: Diagnosis not present

## 2019-09-15 DIAGNOSIS — I4891 Unspecified atrial fibrillation: Secondary | ICD-10-CM

## 2019-09-15 LAB — POCT INR: INR: 2.4 (ref 2.0–3.0)

## 2019-09-15 NOTE — Patient Instructions (Signed)
Continue coumadin 1 tablet daily except 1.5 tablets on Sundays and Wednesdays   Repeat INR in 8 weeks.  

## 2019-09-16 ENCOUNTER — Encounter: Payer: Medicare HMO | Admitting: Vascular Surgery

## 2019-09-16 ENCOUNTER — Encounter (HOSPITAL_COMMUNITY): Payer: Medicare HMO

## 2019-09-16 ENCOUNTER — Encounter: Payer: Self-pay | Admitting: "Endocrinology

## 2019-09-16 LAB — HM DIABETES EYE EXAM

## 2019-10-06 ENCOUNTER — Other Ambulatory Visit: Payer: Self-pay

## 2019-10-06 DIAGNOSIS — R972 Elevated prostate specific antigen [PSA]: Secondary | ICD-10-CM

## 2019-10-13 ENCOUNTER — Other Ambulatory Visit: Payer: Self-pay | Admitting: *Deleted

## 2019-10-13 DIAGNOSIS — M7989 Other specified soft tissue disorders: Secondary | ICD-10-CM

## 2019-10-13 DIAGNOSIS — M79606 Pain in leg, unspecified: Secondary | ICD-10-CM

## 2019-10-14 ENCOUNTER — Ambulatory Visit: Payer: Medicare HMO | Admitting: Vascular Surgery

## 2019-10-14 ENCOUNTER — Encounter: Payer: Self-pay | Admitting: Vascular Surgery

## 2019-10-14 ENCOUNTER — Ambulatory Visit (HOSPITAL_COMMUNITY)
Admission: RE | Admit: 2019-10-14 | Discharge: 2019-10-14 | Disposition: A | Discharge status: Home / Self Care | Payer: Medicare HMO | Source: Ambulatory Visit | Attending: Vascular Surgery | Admitting: Vascular Surgery

## 2019-10-14 ENCOUNTER — Other Ambulatory Visit: Payer: Self-pay

## 2019-10-14 VITALS — BP 171/72 | HR 56 | Temp 97.3°F | Resp 18 | Ht 74.0 in | Wt 213.8 lb

## 2019-10-14 DIAGNOSIS — M79606 Pain in leg, unspecified: Secondary | ICD-10-CM | POA: Diagnosis not present

## 2019-10-14 DIAGNOSIS — M7989 Other specified soft tissue disorders: Secondary | ICD-10-CM

## 2019-10-14 DIAGNOSIS — I739 Peripheral vascular disease, unspecified: Secondary | ICD-10-CM

## 2019-10-14 NOTE — Progress Notes (Signed)
Vascular and Vein Specialist of [facility]  Patient name: Jason Alvarez MRN: [id number] DOB: [birth year] Sex: male  REASON FOR CONSULT: Evaluation lower extremity discomfort  HPI: Jason Alvarez is a 79 y.o. male, who is here today for evaluation.  He reports discomfort in his calves.  This is not related to walking.  He reports that some days he notices it and other days he does not.  He is able to do his usual daily functions.  His wife is very ill and he does all the work around the house.  He does have history of chronic atrial fibrillation and is on Coumadin for this.  He has no history of lower extremity tissue loss.  He does have a history of venous hypertension in both lower extremities.  He underwent successful ablation of his right great saphenous vein with Dr. Kellie Simmering in 2017.  He reports a very good durable response to this treatment  Past Medical History:  Diagnosis Date  . A-fib ([place])    paf with NSVT on MCOT, on coumadin, echo 01/31/08-nl lv, mild aortic valve sclerosis   . Aneurysm ([place])   . Anxiety   . Atrial fibrillation ([place])   . Carotid bruit    doppler 05/17/12-mild plaque with no diameter reduction  . Chronic anticoagulation   . Chronic ITP (idiopathic thrombocytopenia) (HCC) 04/10/2012   Low grade thrombocytopenia consistent with chronic ITP not in need of therapy.   . DM (diabetes mellitus) ([place])   . Dysrhythmia    afib  . H/O cardiovascular stress test 01/31/08   no ishcemia, ef 61%  . History of kidney stones   . HTN (hypertension)   . Hypertension   . ITP (idiopathic thrombocytopenic purpura)   . Thrombocytopenia ([place])     Family History  Problem Relation Age of Onset  . Prostate cancer Father   . Cancer Sister        breast  . Cancer Brother        lung  . Melanoma Mother     SOCIAL HISTORY: Social History   Socioeconomic History  . Marital status: Married    Spouse name: Not on file  . Number of children:  Not on file  . Years of education: Not on file  . Highest education level: Not on file  Occupational History  . Not on file  Tobacco Use  . Smoking status: Never Smoker  . Smokeless tobacco: Never Used  Substance and Sexual Activity  . Alcohol use: No  . Drug use: No  . Sexual activity: Not on file  Other Topics Concern  . Not on file  Social History Narrative  . Not on file   Social Determinants of Health   Financial Resource Strain:   . Difficulty of Paying Living Expenses:   Food Insecurity:   . Worried About Charity fundraiser in the Last Year:   . Arboriculturist in the Last Year:   Transportation Needs:   . Film/video editor (Medical):   Marland Kitchen Lack of Transportation (Non-Medical):   Physical Activity:   . Days of Exercise per Week:   . Minutes of Exercise per Session:   Stress:   . Feeling of Stress :   Social Connections:   . Frequency of Communication with Friends and Family:   . Frequency of Social Gatherings with Friends and Family:   . Attends Religious Services:   . Active Member of Clubs or Organizations:   .  Attends Archivist Meetings:   Marland Kitchen Marital Status:   Intimate Partner Violence:   . Fear of Current or Ex-Partner:   . Emotionally Abused:   Marland Kitchen Physically Abused:   . Sexually Abused:     Allergies  Allergen Reactions  . Penicillins Other (See Comments)    Childhood allergy Has patient had a PCN reaction causing immediate rash, facial/tongue/throat swelling, SOB or lightheadedness with hypotension: Unknown Has patient had a PCN reaction causing severe rash involving mucus membranes or skin necrosis: Unknown Has patient had a PCN reaction that required hospitalization: Unknown Has patient had a PCN reaction occurring within the last 10 years: Unknown If all of the above answers are "NO", then may proceed with Cephalosporin use.    Current Outpatient Medications  Medication Sig Dispense Refill  . ALPRAZolam (XANAX) 1 MG tablet     .  amLODipine (NORVASC) 10 MG tablet Take 1 tablet (10 mg total) by mouth daily. 30 tablet 6  . finasteride (PROSCAR) 5 MG tablet Take 5 mg by mouth daily.    . flecainide (TAMBOCOR) 100 MG tablet Take 1 tablet (100 mg total) by mouth 2 (two) times daily. 180 tablet 2  . glipiZIDE-metformin (METAGLIP) 5-500 MG per tablet Take 1 tablet by mouth 2 (two) times daily after a meal.    . hydrochlorothiazide (HYDRODIURIL) 25 MG tablet Take 25 mg by mouth daily.      . metoprolol succinate (TOPROL-XL) 50 MG 24 hr tablet TAKE ONE (1) TABLET EACH DAY 90 tablet 2  . OneTouch Delica Lancets 99991111 MISC     . ONETOUCH VERIO test strip 1 each 2 (two) times daily.    . ramipril (ALTACE) 10 MG capsule Take 1 capsule (10 mg total) by mouth 2 (two) times daily. 180 capsule 2  . warfarin (COUMADIN) 5 MG tablet TAKE 1 TO 1&1/2 TABLET DAILY AS DIRECTED 120 tablet 1   No current facility-administered medications for this visit.    REVIEW OF SYSTEMS:  [X]  denotes positive finding, [ ]  denotes negative finding Cardiac  Comments:  Chest pain or chest pressure:    Shortness of breath upon exertion:    Short of breath when lying flat:    Irregular heart rhythm: x       Vascular    Pain in calf, thigh, or hip brought on by ambulation:    Pain in feet at night that wakes you up from your sleep:     Blood clot in your veins:    Leg swelling:  x       Pulmonary    Oxygen at home:    Productive cough:     Wheezing:         Neurologic    Sudden weakness in arms or legs:     Sudden numbness in arms or legs:     Sudden onset of difficulty speaking or slurred speech:    Temporary loss of vision in one eye:     Problems with dizziness:         Gastrointestinal    Blood in stool:     Vomited blood:         Genitourinary    Burning when urinating:     Blood in urine:        Psychiatric    Major depression:         Hematologic    Bleeding problems:    Problems with blood clotting too easily:  Skin      Rashes or ulcers:        Constitutional    Fever or chills:      PHYSICAL EXAM: Vitals:   10/14/19 1003  BP: (!) 171/72  Pulse: (!) 56  Resp: 18  Temp: (!) 97.3 F (36.3 C)  TempSrc: Temporal  SpO2: 100%  Weight: 213 lb 12.8 oz (97 kg)  Height: 6\' 2"  (1.88 m)    GENERAL: The patient is a well-nourished male, in no acute distress. The vital signs are documented above. CARDIOVASCULAR: 2+ radial pulses bilaterally.  2+ dorsalis pedis pulses bilaterally.  He does have extensive small varicosities and telangiectasia of both lower extremities PULMONARY: There is good air exchange  ABDOMEN: Soft and non-tender  MUSCULOSKELETAL: There are no major deformities or cyanosis. NEUROLOGIC: No focal weakness or paresthesias are detected. SKIN: There are no ulcers or rashes noted. PSYCHIATRIC: The patient has a normal affect.  DATA:  Noninvasive studies today reveal noncompressible tibial vessels therefore ankle arm index is unreliable.  He does have normal waveforms and does have normal toe brachial index  MEDICAL ISSUES: Discussed these findings with the patient.  He does not have any evidence of significant arterial insufficiency.  I did explain that he does have calcification of his tibial vessels but this is not flow-limiting.  He was reassured with this discussion will see [place] again on an as-needed basis   Rosetta Posner, MD [facility] Vascular and Vein Specialists of [facility] Tel [phone] Pager [phone]

## 2019-11-08 ENCOUNTER — Other Ambulatory Visit: Payer: Self-pay | Admitting: Cardiovascular Disease

## 2019-11-10 ENCOUNTER — Ambulatory Visit (INDEPENDENT_AMBULATORY_CARE_PROVIDER_SITE_OTHER): Payer: Medicare HMO | Admitting: *Deleted

## 2019-11-10 ENCOUNTER — Other Ambulatory Visit: Payer: Self-pay

## 2019-11-10 DIAGNOSIS — Z5181 Encounter for therapeutic drug level monitoring: Secondary | ICD-10-CM

## 2019-11-10 DIAGNOSIS — I4891 Unspecified atrial fibrillation: Secondary | ICD-10-CM | POA: Diagnosis not present

## 2019-11-10 LAB — POCT INR: INR: 2.6 (ref 2.0–3.0)

## 2019-11-10 NOTE — Patient Instructions (Signed)
Continue coumadin 1 tablet daily except 1.5 tablets on Sundays and Wednesdays   Repeat INR in 8 weeks.  

## 2019-11-12 ENCOUNTER — Other Ambulatory Visit (HOSPITAL_COMMUNITY): Payer: Self-pay | Admitting: Nurse Practitioner

## 2019-11-13 ENCOUNTER — Encounter (HOSPITAL_COMMUNITY): Payer: Self-pay | Admitting: Nurse Practitioner

## 2019-11-13 ENCOUNTER — Ambulatory Visit (HOSPITAL_COMMUNITY)
Admission: RE | Admit: 2019-11-13 | Discharge: 2019-11-13 | Disposition: A | Discharge status: Home / Self Care | Payer: Medicare HMO | Source: Ambulatory Visit | Attending: Nurse Practitioner | Admitting: Nurse Practitioner

## 2019-11-13 ENCOUNTER — Other Ambulatory Visit: Payer: Self-pay

## 2019-11-13 VITALS — BP 176/60 | HR 52 | Ht 74.0 in | Wt 217.0 lb

## 2019-11-13 DIAGNOSIS — I480 Paroxysmal atrial fibrillation: Secondary | ICD-10-CM | POA: Diagnosis present

## 2019-11-13 DIAGNOSIS — I10 Essential (primary) hypertension: Secondary | ICD-10-CM | POA: Diagnosis not present

## 2019-11-13 DIAGNOSIS — Z7984 Long term (current) use of oral hypoglycemic drugs: Secondary | ICD-10-CM | POA: Diagnosis not present

## 2019-11-13 DIAGNOSIS — Z7901 Long term (current) use of anticoagulants: Secondary | ICD-10-CM | POA: Diagnosis not present

## 2019-11-13 DIAGNOSIS — D6869 Other thrombophilia: Secondary | ICD-10-CM

## 2019-11-13 DIAGNOSIS — I4891 Unspecified atrial fibrillation: Secondary | ICD-10-CM

## 2019-11-13 DIAGNOSIS — Z79899 Other long term (current) drug therapy: Secondary | ICD-10-CM | POA: Insufficient documentation

## 2019-11-13 DIAGNOSIS — F419 Anxiety disorder, unspecified: Secondary | ICD-10-CM | POA: Diagnosis not present

## 2019-11-13 DIAGNOSIS — D696 Thrombocytopenia, unspecified: Secondary | ICD-10-CM | POA: Diagnosis not present

## 2019-11-13 DIAGNOSIS — E118 Type 2 diabetes mellitus with unspecified complications: Secondary | ICD-10-CM | POA: Insufficient documentation

## 2019-11-13 NOTE — Progress Notes (Signed)
Patient ID: Jason Alvarez, male   DOB: [birth year], 79 y.o.   MRN: [id number]     Primary Care Physician: Redmond School, MD Referring Physician: Dr. Charlyne Petrin is a 79 y.o. male with a h/o PAF on flecainide. He reports that he had a mechanical fall one month ago  but did not suffer any significant injury, SInce then he feels that he may have had more fluttering but it is very difficult for him to  describe.Marland Kitchen   His blood pressure is also elevated but on recheck 140 /50. He shows me his BP readings many of which are showing systolics of [id number]. He was on this dose in the past but he lowered as he felt BP readings were getting too low. He tells me that he was started on a baby asa a few months ago by his PCP, he did not know why. I see in Care everywhere that he was seen in October for dizziness by Neurology at [facility] and was found to have vestibular stenosis so may be 2/2 to this. He feels he is bruising more with also being on warfarin, CHA2DS2VASc score of at least 4.  He also admits that his blood glucose not too long ago got up to 15. He made some dietary changes and brought  It down to 7 but has slipped again in his dietary control.  F/u in afib clinic, 4/29. He remembers only one short afib episode otherwise, flecainide continues to keep him in rhythm. His BP is always elevated in the office, but he shows me his log from home  and  His BP's run 120-130 with a rare [id number] to 0000000 systolic. He has occasional leg weakness, was seen by V/V and circulation confirmed to be ok. It is intermittent and other times, his legs feel strong. His wife is sick and he has  to do the majority of the house work. He had las 6 motnhs ago with PCP. He continues on warfarin with last INR 4/26 at 2.6. no bleeding issues. CHA2DS2VASc score is 4. He has had both covid shots.   Today, , negative for  chest pain, shortness of breath, orthopnea, PND, lower extremity edema, dizziness, presyncope, syncope, or  neurologic sequela. The patient is tolerating medications without difficulties and is otherwise without complaint today.   Past Medical History:  Diagnosis Date  . A-fib ([place])    paf with NSVT on MCOT, on coumadin, echo 01/31/08-nl lv, mild aortic valve sclerosis   . Aneurysm ([place])   . Anxiety   . Atrial fibrillation ([place])   . Carotid bruit    doppler 05/17/12-mild plaque with no diameter reduction  . Chronic anticoagulation   . Chronic ITP (idiopathic thrombocytopenia) (HCC) 04/10/2012   Low grade thrombocytopenia consistent with chronic ITP not in need of therapy.   . DM (diabetes mellitus) ([place])   . Dysrhythmia    afib  . H/O cardiovascular stress test 01/31/08   no ishcemia, ef 61%  . History of kidney stones   . HTN (hypertension)   . Hypertension   . ITP (idiopathic thrombocytopenic purpura)   . Thrombocytopenia ([place])    Past Surgical History:  Procedure Laterality Date  . CIRCUMCISION    . COLONOSCOPY  06/25/2012   Procedure: COLONOSCOPY;  Surgeon: Jamesetta So, MD;  [facility];  Service: Gastroenterology;  Laterality: N/A;  . COLONOSCOPY N/A 01/30/2017   Procedure: COLONOSCOPY;  Surgeon: Aviva Signs, MD;  Location[facility];  Service: Gastroenterology;  Laterality: N/A;  . KNEE ARTHROSCOPY     right and left knee/1992 and 2000  . POLYPECTOMY  01/30/2017   Procedure: POLYPECTOMY;  Surgeon: Aviva Signs, MD;  Location: [facility];  Service: Gastroenterology;;  colon  . VASECTOMY      Current Outpatient Medications  Medication Sig Dispense Refill  . ALPRAZolam (XANAX) 1 MG tablet Taking 1 tablet by mouth 4 times daily    . amLODipine (NORVASC) 10 MG tablet Take 1 tablet (10 mg total) by mouth daily. 30 tablet 6  . finasteride (PROSCAR) 5 MG tablet Take 5 mg by mouth daily.    . flecainide (TAMBOCOR) 100 MG tablet Take 1 tablet (100 mg total) by mouth 2 (two) times daily. 180 tablet 2  . FLUAD 0.5 ML SUSY Inject as directed See admin instructions.     Marland Kitchen glipiZIDE-metformin (METAGLIP) 5-500 MG per tablet Take 1 tablet by mouth 2 (two) times daily after a meal.    . hydrochlorothiazide (HYDRODIURIL) 25 MG tablet Take 25 mg by mouth daily.      . metoprolol succinate (TOPROL-XL) 50 MG 24 hr tablet TAKE ONE (1) TABLET EACH DAY 90 tablet 2  . OneTouch Delica Lancets 99991111 MISC     . ONETOUCH VERIO test strip 1 each 2 (two) times daily.    . ramipril (ALTACE) 10 MG capsule Take 1 capsule (10 mg total) by mouth 2 (two) times daily. 180 capsule 2  . warfarin (COUMADIN) 5 MG tablet TAKE 1 TO 1&1/2 TABLET DAILY AS DIRECTED 120 tablet 1  . gabapentin (NEURONTIN) 300 MG capsule Take 300 mg by mouth daily.    . hydrocortisone (ANUSOL-HC) 25 MG suppository Place 25 mg rectally 2 (two) times daily.     No current facility-administered medications for this encounter.    Allergies  Allergen Reactions  . Penicillins Other (See Comments)    Childhood allergy Has patient had a PCN reaction causing immediate rash, facial/tongue/throat swelling, SOB or lightheadedness with hypotension: Unknown Has patient had a PCN reaction causing severe rash involving mucus membranes or skin necrosis: Unknown Has patient had a PCN reaction that required hospitalization: Unknown Has patient had a PCN reaction occurring within the last 10 years: Unknown If all of the above answers are "NO", then may proceed with Cephalosporin use.    Social History   Socioeconomic History  . Marital status: Married    Spouse name: Not on file  . Number of children: Not on file  . Years of education: Not on file  . Highest education level: Not on file  Occupational History  . Not on file  Tobacco Use  . Smoking status: Never Smoker  . Smokeless tobacco: Never Used  Substance and Sexual Activity  . Alcohol use: No  . Drug use: No  . Sexual activity: Not on file  Other Topics Concern  . Not on file  Social History Narrative  . Not on file   Social Determinants of Health    Financial Resource Strain:   . Difficulty of Paying Living Expenses:   Food Insecurity:   . Worried About Charity fundraiser in the Last Year:   . Arboriculturist in the Last Year:   Transportation Needs:   . Film/video editor (Medical):   Marland Kitchen Lack of Transportation (Non-Medical):   Physical Activity:   . Days of Exercise per Week:   . Minutes of Exercise per Session:   Stress:   . Feeling  of Stress :   Social Connections:   . Frequency of Communication with Friends and Family:   . Frequency of Social Gatherings with Friends and Family:   . Attends Religious Services:   . Active Member of Clubs or Organizations:   . Attends Archivist Meetings:   Marland Kitchen Marital Status:   Intimate Partner Violence:   . Fear of Current or Ex-Partner:   . Emotionally Abused:   Marland Kitchen Physically Abused:   . Sexually Abused:     Family History  Problem Relation Age of Onset  . Prostate cancer Father   . Cancer Sister        breast  . Cancer Brother        lung  . Melanoma Mother     ROS- All systems are reviewed and negative except as per the HPI above  Physical Exam: Vitals:   11/13/19 0838  BP: (!) 176/60  Pulse: (!) 52  Weight: 98.4 kg  Height: 6\' 2"  (1.88 m)    GEN- The patient is well appearing, alert and oriented x 3 today.   Head- normocephalic, atraumatic Eyes-  Sclera clear, conjunctiva pink Ears- hearing intact Oropharynx- clear Neck- supple, no JVP Lymph- no cervical lymphadenopathy Lungs- Clear to ausculation bilaterally, normal work of breathing Heart- Regular rate and rhythm, no murmurs, rubs or gallops, PMI not laterally displaced Extremities- no clubbing, cyanosis, or edema MS- no significant deformity or atrophy Skin- no rash or lesion Psych- euthymic mood, full affect Neuro- strength and sensation are intact  EKG- Sinus brady at 52 bpm, pr int 242 ms  qrs int 114 ms, qtc 455 Epic records reviewed  Assessment and Plan: 1. Paroxysmal atrial  fibrillation Low burden  Continue  Flecainide 100 mg bid Continue metoprolol succinate  25  mg bid   2.HTN BP is elevated today better on recheck but readings at home are controlled  Continue  Amlodipine at 10 mg daily hctz, ramipril  and continue with BP checks at home  3. Chadsvasc score of at least 4 Continue warfarin, followed by [facility] office    F/u 6 months   Geroge Baseman. Shivaay Stormont, [facility] [address] [place], [place] [address] [phone]

## 2019-11-14 MED ORDER — METOPROLOL SUCCINATE ER 50 MG PO TB24: ORAL_TABLET | ORAL | Status: DC

## 2019-11-14 NOTE — Addendum Note (Signed)
Encounter addended by: Enid Derry, CMA on: 11/14/2019 4:41 PM  Actions taken: Order list changed

## 2019-11-28 ENCOUNTER — Ambulatory Visit: Payer: Medicare HMO | Admitting: Urology

## 2019-12-04 ENCOUNTER — Other Ambulatory Visit: Payer: Self-pay | Admitting: Urology

## 2019-12-04 LAB — PSA: PSA: 8.6

## 2019-12-11 NOTE — Progress Notes (Signed)
Subjective:  1. Elevated PSA   2. BPH with urinary obstruction   3. Urinary frequency   4. Nocturia   5. Balanitis     I have irritation and discomfort of the head of my penis. HPI: Jason Alvarez is a 79 year-old male established patient who is here for irritation and discomfort of the head of his penis.    Jason Alvarez has had some recent recurrent balanitis that resolved with nystatin.    CC/HPI: I have symptoms of an enlarged prostate.  He was started on finasteride at his visit 6 months ago.  Jason Alvarez returns today in f/u for his several year history of BPH with BOO and an elevated PSA. His PSA was 6.1 in 11/26/18.  It is 8.6 with a 34% f/t ratio on 12/04/19 which is in his usual range.  He had a prostate MRI in the fall of 2017 that was negative but his prostate was very large. He was given a script for finasteride but he never started it. He is voiding with stable symptoms other than increased frequency. His IPSS is 27. He has no associated signs or symptoms.     IPSS    Row Name 12/12/19 0900         International Prostate Symptom Score   How often have you had the sensation of not emptying your bladder?  More than half the time     How often have you had to urinate less than every two hours?  More than half the time     How often have you found you stopped and started again several times when you urinated?  More than half the time     How often have you found it difficult to postpone urination?  More than half the time     How often have you had a weak urinary stream?  More than half the time     How often have you had to strain to start urination?  More than half the time     How many times did you typically get up at night to urinate?  3 Times     Total IPSS Score  27         ROS:  ROS:  A complete review of systems was performed.  All systems are negative except for pertinent findings as noted.   Review of Systems  Gastrointestinal:       He has some mild fecal  incontinence.   All other systems reviewed and are negative.   Allergies  Allergen Reactions  . Penicillins Other (See Comments)    Childhood allergy Has patient had a PCN reaction causing immediate rash, facial/tongue/throat swelling, SOB or lightheadedness with hypotension: Unknown Has patient had a PCN reaction causing severe rash involving mucus membranes or skin necrosis: Unknown Has patient had a PCN reaction that required hospitalization: Unknown Has patient had a PCN reaction occurring within the last 10 years: Unknown If all of the above answers are "NO", then may proceed with Cephalosporin use.    Outpatient Encounter Medications as of 12/12/2019  Medication Sig  . ALPRAZolam (XANAX) 1 MG tablet Taking 1 tablet by mouth 4 times daily  . amLODipine (NORVASC) 10 MG tablet Take 1 tablet (10 mg total) by mouth daily.  . flecainide (TAMBOCOR) 100 MG tablet Take 1 tablet (100 mg total) by mouth 2 (two) times daily.  Marland Kitchen glipiZIDE-metformin (METAGLIP) 5-500 MG per tablet Take 1 tablet by mouth 2 (two) times daily after  a meal.  . hydrochlorothiazide (HYDRODIURIL) 25 MG tablet Take 25 mg by mouth daily.    . metoprolol succinate (TOPROL-XL) 50 MG 24 hr tablet Take 1/2 tablet by mouth twice a day  . OneTouch [employer]ica Lancets [id number] MISC   . ONETOUCH VERIO test strip 1 each 2 (two) times daily.  . ramipril (ALTACE) 10 MG capsule Take 1 capsule (10 mg total) by mouth 2 (two) times daily.  Marland Kitchen warfarin (COUMADIN) 5 MG tablet TAKE 1 TO 1&1/2 TABLET DAILY AS DIRECTED  . finasteride (PROSCAR) 5 MG tablet Take 1 tablet (5 mg total) by mouth daily.  Marland Kitchen FLUAD 0.5 ML SUSY Inject as directed See admin instructions.  . gabapentin (NEURONTIN) 300 MG capsule Take 300 mg by mouth daily.  . hydrocortisone (ANUSOL-HC) 25 MG suppository Place 25 mg rectally 2 (two) times daily.  . [DISCONTINUED] finasteride (PROSCAR) 5 MG tablet Take 5 mg by mouth daily.   No facility-administered encounter medications on file  as of 12/12/2019.    Past Medical History:  Diagnosis Date  . A-fib ([place])    paf with NSVT on MCOT, on coumadin, echo 01/31/08-nl lv, mild aortic valve sclerosis   . Aneurysm ([place])   . Anxiety   . Atrial fibrillation ([place])   . Carotid bruit    doppler 05/17/12-mild plaque with no diameter reduction  . Chronic anticoagulation   . Chronic ITP (idiopathic thrombocytopenia) ([facility]) 04/10/2012   Low grade thrombocytopenia consistent with chronic ITP not in need of therapy.   . DM (diabetes mellitus) ([place])   . Dysrhythmia    afib  . H/O cardiovascular stress test 01/31/08   no ishcemia, ef 61%  . History of kidney stones   . HTN (hypertension)   . Hypertension   . ITP (idiopathic thrombocytopenic purpura)   . Thrombocytopenia ([place])     Past Surgical History:  Procedure Laterality Date  . CIRCUMCISION    . COLONOSCOPY  06/25/2012   Procedure: COLONOSCOPY;  Surgeon: Jamesetta So, MD;  [facility];  Service: Gastroenterology;  Laterality: N/A;  . COLONOSCOPY N/A 01/30/2017   Procedure: COLONOSCOPY;  Surgeon: Aviva Signs, MD;  [facility];  Service: Gastroenterology;  Laterality: N/A;  . KNEE ARTHROSCOPY     right and left knee/1992 and 2000  . POLYPECTOMY  01/30/2017   Procedure: POLYPECTOMY;  Surgeon: Aviva Signs, MD;  [facility];  Service: Gastroenterology;;  colon  . VASECTOMY      Social History   Socioeconomic History  . Marital status: Married    Spouse name: Not on file  . Number of children: Not on file  . Years of education: Not on file  . Highest education level: Not on file  Occupational History  . Not on file  Tobacco Use  . Smoking status: Never Smoker  . Smokeless tobacco: Never Used  Substance and Sexual Activity  . Alcohol use: No  . Drug use: No  . Sexual activity: Not on file  Other Topics Concern  . Not on file  Social History Narrative  . Not on file   Social Determinants of Health   Financial Resource  Strain:   . Difficulty of Paying Living Expenses:   Food Insecurity:   . Worried About Charity fundraiser in the Last Year:   . Arboriculturist in the Last Year:   Transportation Needs:   . Film/video editor (Medical):   Marland Kitchen Lack of Transportation (Non-Medical):  Physical Activity:   . Days of Exercise per Week:   . Minutes of Exercise per Session:   Stress:   . Feeling of Stress :   Social Connections:   . Frequency of Communication with Friends and Family:   . Frequency of Social Gatherings with Friends and Family:   . Attends Religious Services:   . Active Member of Clubs or Organizations:   . Attends Archivist Meetings:   Marland Kitchen Marital Status:   Intimate Partner Violence:   . Fear of Current or Ex-Partner:   . Emotionally Abused:   Marland Kitchen Physically Abused:   . Sexually Abused:     Family History  Problem Relation Age of Onset  . Prostate cancer Father   . Cancer Sister        breast  . Cancer Brother        lung  . Melanoma Mother        Objective: Vitals:   12/12/19 0844  BP: (!) 151/66  Pulse: (!) 50  Temp: (!) 97.3 F (36.3 C)     Physical Exam Vitals reviewed.  Constitutional:      Appearance: Normal appearance.  Genitourinary:    Comments: AP without lesions. Vault empty.  Sphincter is slight weak. Prostate 2+ benign. SV's non-palpable.  Neurological:     Mental Status: He is alert.     Lab Results:  Results for orders placed or performed in visit on 12/12/19 (from the past 24 hour(s))  POCT urinalysis dipstick     Status: Abnormal   Collection Time: 12/12/19  8:49 AM  Result Value Ref Range   Color, UA yellow    Clarity, UA clear    Glucose, UA Positive (A) Negative   Bilirubin, UA neg    Ketones, UA neg    Spec Grav, UA 1.025 1.010 - 1.025   Blood, UA neg    pH, UA 5.0 5.0 - 8.0   Protein, UA Positive (A) Negative   Urobilinogen, UA negative (A) 0.2 or 1.0 E.U./dL   Nitrite, UA neg    Leukocytes, UA Negative Negative    Appearance clear    Odor      BMET No results for input(s): NA, K, CL, CO2, GLUCOSE, BUN, CREATININE, CALCIUM in the last 72 hours. PSA PSA  Date Value Ref Range Status  12/04/2019 8.6  Final  11/26/2018 6.1  Final   No results found for: TESTOSTERONE    Studies/Results: No results found.    Assessment & Plan: BPH with BOO.  He has progressive LUTS and his PSA is up a bit from the prior level.   I refill the finasteride and encouraged him to start it.  He will return in 6 months with a PSA.  Balanitis has resolved with nystatin    Meds ordered this encounter  Medications  . finasteride (PROSCAR) 5 MG tablet    Sig: Take 1 tablet (5 mg total) by mouth daily.    Dispense:  90 tablet    Refill:  3     Orders Placed This Encounter  Procedures  . PSA    This external order was created through the Results Console.  . PSA, total and free    Standing Status:   Future    Standing Expiration Date:   12/11/2020  . POCT urinalysis dipstick      Return in about 6 months (around 06/13/2020) for with PSA .   CC: Redmond School, MD  Irine Seal 12/12/2019

## 2019-12-12 ENCOUNTER — Other Ambulatory Visit: Payer: Self-pay

## 2019-12-12 ENCOUNTER — Encounter: Payer: Self-pay | Admitting: Urology

## 2019-12-12 ENCOUNTER — Ambulatory Visit: Payer: Medicare HMO | Admitting: Urology

## 2019-12-12 VITALS — BP 151/66 | HR 50 | Temp 97.3°F | Ht 75.0 in | Wt 217.0 lb

## 2019-12-12 DIAGNOSIS — R350 Frequency of micturition: Secondary | ICD-10-CM

## 2019-12-12 DIAGNOSIS — N138 Other obstructive and reflux uropathy: Secondary | ICD-10-CM

## 2019-12-12 DIAGNOSIS — R351 Nocturia: Secondary | ICD-10-CM

## 2019-12-12 DIAGNOSIS — N401 Enlarged prostate with lower urinary tract symptoms: Secondary | ICD-10-CM | POA: Diagnosis not present

## 2019-12-12 DIAGNOSIS — R972 Elevated prostate specific antigen [PSA]: Secondary | ICD-10-CM | POA: Diagnosis not present

## 2019-12-12 DIAGNOSIS — N481 Balanitis: Secondary | ICD-10-CM

## 2019-12-12 LAB — POCT URINALYSIS DIPSTICK
Bilirubin, UA: NEGATIVE
Blood, UA: NEGATIVE
Glucose, UA: POSITIVE — AB
Ketones, UA: NEGATIVE
Leukocytes, UA: NEGATIVE
Nitrite, UA: NEGATIVE
Protein, UA: POSITIVE — AB
Spec Grav, UA: 1.025 (ref 1.010–1.025)
Urobilinogen, UA: NEGATIVE E.U./dL — AB
pH, UA: 5 (ref 5.0–8.0)

## 2019-12-12 MED ORDER — FINASTERIDE 5 MG PO TABS: 5.0000 mg | ORAL_TABLET | Freq: Every day | ORAL | 3 refills | Status: DC

## 2019-12-12 NOTE — Progress Notes (Signed)
Urological Symptom Review  Patient is experiencing the following symptoms: Trouble starting stream Weak stream Erection problems (male only)   Review of Systems  Gastrointestinal (upper)  : Negative for upper GI symptoms  Gastrointestinal (lower) : Diarrhea  Constitutional : Negative for symptoms  Skin: Negative for skin symptoms  Eyes: Negative for eye symptoms  Ear/Nose/Throat : Sinus problems  Hematologic/Lymphatic: Negative for Hematologic/Lymphatic symptoms  Cardiovascular : Negative for cardiovascular symptoms  Respiratory : Negative for respiratory symptoms  Endocrine: Negative for endocrine symptoms  Musculoskeletal: Joint pain  Neurological: Negative for neurological symptoms  Psychologic: Negative for psychiatric symptoms

## 2019-12-25 ENCOUNTER — Other Ambulatory Visit: Payer: Self-pay

## 2019-12-25 ENCOUNTER — Ambulatory Visit: Payer: Medicare HMO | Admitting: General Surgery

## 2019-12-25 ENCOUNTER — Encounter: Payer: Self-pay | Admitting: General Surgery

## 2019-12-25 VITALS — BP 159/72 | HR 56 | Temp 98.1°F | Resp 16 | Ht 74.0 in | Wt 214.0 lb

## 2019-12-25 DIAGNOSIS — Z86010 Personal history of colonic polyps: Secondary | ICD-10-CM

## 2019-12-25 NOTE — Patient Instructions (Signed)

## 2019-12-25 NOTE — Progress Notes (Signed)
Jason Alvarez; [id number]; June 22, 1941   HPI Is a 79 year old white male who was referred to my care by Dr. Gerarda Fraction for need for colonoscopy.  Patient states that he has had no abdominal pain, blood per rectum, weight change, constipation, or diarrhea.  He does state that sometimes his bowels have been a little looser and he has had some soilage of his underwear.  He is on chronic anticoagulation for atrial fibrillation.  He does have a history of colon polyps, last undergoing a colonoscopy for a tubular adenoma of the cecum in 2018.  No malignancy was seen.  He denies any abdominal pain.  He was wondering whether he needed a follow-up colonoscopy given his age. Past Medical History:  Diagnosis Date   A-fib ([place])    paf with NSVT on MCOT, on coumadin, echo 01/31/08-nl lv, mild aortic valve sclerosis    Aneurysm ([facility])    Anxiety    Atrial fibrillation ([facility])    Carotid bruit    doppler 05/17/12-mild plaque with no diameter reduction   Chronic anticoagulation    Chronic ITP (idiopathic thrombocytopenia) ([facility]) 04/10/2012   Low grade thrombocytopenia consistent with chronic ITP not in need of therapy.    DM (diabetes mellitus) ([place])    Dysrhythmia    afib   H/O cardiovascular stress test 01/31/08   no ishcemia, ef 61%   History of kidney stones    HTN (hypertension)    Hypertension    ITP (idiopathic thrombocytopenic purpura)    Thrombocytopenia ([place])     Past Surgical History:  Procedure Laterality Date   CIRCUMCISION     COLONOSCOPY  06/25/2012   Procedure: COLONOSCOPY;  Surgeon: Jamesetta So, MD;  [facility];  Service: Gastroenterology;  Laterality: N/A;   COLONOSCOPY N/A 01/30/2017   Procedure: COLONOSCOPY;  Surgeon: Aviva Signs, MD;  [facility];  Service: Gastroenterology;  Laterality: N/A;   KNEE ARTHROSCOPY     right and left knee/1992 and 2000   POLYPECTOMY  01/30/2017   Procedure: POLYPECTOMY;  Surgeon: Aviva Signs, MD;  [facility];  Service: Gastroenterology;;  colon   VASECTOMY      Family History  Problem Relation Age of Onset   Prostate cancer Father    Cancer Sister        breast   Cancer Brother        lung   Melanoma Mother     Current Outpatient Medications on File Prior to Visit  Medication Sig Dispense Refill   ALPRAZolam (XANAX) 1 MG tablet Taking 1 tablet by mouth 4 times daily     amLODipine (NORVASC) 10 MG tablet Take 1 tablet (10 mg total) by mouth daily. 30 tablet 6   flecainide (TAMBOCOR) 100 MG tablet Take 1 tablet (100 mg total) by mouth 2 (two) times daily. 180 tablet 2   FLUAD 0.5 ML SUSY Inject as directed See admin instructions.     gabapentin (NEURONTIN) 300 MG capsule Take 300 mg by mouth daily.     glipiZIDE-metformin (METAGLIP) 5-500 MG per tablet Take 1 tablet by mouth 2 (two) times daily after a meal.     hydrochlorothiazide (HYDRODIURIL) 25 MG tablet Take 25 mg by mouth daily.       hydrocortisone (ANUSOL-HC) 25 MG suppository Place 25 mg rectally 2 (two) times daily.     metoprolol succinate (TOPROL-XL) 50 MG 24 hr tablet Take 1/2 tablet by mouth twice a day     [employer]  Lancets 33G MISC      ONETOUCH VERIO test strip 1 each 2 (two) times daily.     ramipril (ALTACE) 10 MG capsule Take 1 capsule (10 mg total) by mouth 2 (two) times daily. 180 capsule 2   warfarin (COUMADIN) 5 MG tablet TAKE 1 TO 1&1/2 TABLET DAILY AS DIRECTED 120 tablet 1   finasteride (PROSCAR) 5 MG tablet Take 1 tablet (5 mg total) by mouth daily. (Patient not taking: Reported on 12/25/2019) 90 tablet 3   No current facility-administered medications on file prior to visit.    Allergies  Allergen Reactions   Penicillins Other (See Comments)    Childhood allergy Has patient had a PCN reaction causing immediate rash, facial/tongue/throat swelling, SOB or lightheadedness with hypotension: Unknown Has patient had a PCN reaction causing severe rash involving mucus  membranes or skin necrosis: Unknown Has patient had a PCN reaction that required hospitalization: Unknown Has patient had a PCN reaction occurring within the last 10 years: Unknown If all of the above answers are "NO", then may proceed with Cephalosporin use.    Social History   Substance and Sexual Activity  Alcohol Use No    Social History   Tobacco Use  Smoking Status Never Smoker  Smokeless Tobacco Never Used    Review of Systems  Constitutional: Negative.   HENT: Positive for sinus pain.   Eyes: Negative.   Cardiovascular: Negative.   Gastrointestinal: Negative.   Genitourinary: Negative.   Musculoskeletal: Positive for joint pain.  Skin: Negative.   Neurological: Negative.   Endo/Heme/Allergies: Bruises/bleeds easily.  Psychiatric/Behavioral: Negative.     Objective   Vitals:   12/25/19 0931  BP: (!) 159/72  Pulse: (!) 56  Resp: 16  Temp: 98.1 F (36.7 C)  SpO2: 97%    Physical Exam Vitals reviewed.  Constitutional:      Appearance: Normal appearance. He is not ill-appearing.  HENT:     Head: Normocephalic and atraumatic.  Cardiovascular:     Rate and Rhythm: Normal rate. Rhythm irregular.     Heart sounds: Normal heart sounds. No murmur heard.  No friction rub. No gallop.   Pulmonary:     Effort: Pulmonary effort is normal. No respiratory distress.     Breath sounds: Normal breath sounds. No stridor. No wheezing or rales.  Abdominal:     General: Bowel sounds are normal. There is no distension.     Palpations: Abdomen is soft. There is no mass.     Tenderness: There is no abdominal tenderness. There is no guarding or rebound.     Hernia: No hernia is present.  Skin:    General: Skin is warm and dry.  Neurological:     Mental Status: He is alert and oriented to person, place, and time.    Colonoscopy report reviewed, pathology report reviewed Assessment  History of colon polyps, last colonoscopy 2018, chronic anticoagulation, mild soilage  of underwear. Plan   Given his previous colonoscopy results, age, comorbidities, and lack of abdominal symptoms, I do not feel a colonoscopy is warranted at this point.  His soilage of underwear may be related to other medical issues and medication use.  I did tell him to increase the fiber in his diet to make his stools bulkier.  He is going to try that and get back to me in the next few months.  He is agreeable to delaying any colonoscopy at this point.

## 2020-01-05 ENCOUNTER — Other Ambulatory Visit: Payer: Self-pay

## 2020-01-05 ENCOUNTER — Ambulatory Visit (INDEPENDENT_AMBULATORY_CARE_PROVIDER_SITE_OTHER): Payer: Medicare HMO | Admitting: *Deleted

## 2020-01-05 DIAGNOSIS — Z5181 Encounter for therapeutic drug level monitoring: Secondary | ICD-10-CM

## 2020-01-05 DIAGNOSIS — I4891 Unspecified atrial fibrillation: Secondary | ICD-10-CM | POA: Diagnosis not present

## 2020-01-05 LAB — POCT INR: INR: 2.8 (ref 2.0–3.0)

## 2020-01-05 NOTE — Patient Instructions (Signed)
Continue coumadin 1 tablet daily except 1.5 tablets on Sundays and Wednesdays   Repeat INR in 8 weeks.

## 2020-02-05 ENCOUNTER — Ambulatory Visit (INDEPENDENT_AMBULATORY_CARE_PROVIDER_SITE_OTHER): Payer: Medicare HMO | Admitting: General Surgery

## 2020-02-05 ENCOUNTER — Encounter: Payer: Self-pay | Admitting: General Surgery

## 2020-02-05 ENCOUNTER — Other Ambulatory Visit: Payer: Self-pay

## 2020-02-05 VITALS — BP 169/73 | HR 54 | Temp 97.5°F | Resp 16 | Ht 74.0 in | Wt 214.0 lb

## 2020-02-05 DIAGNOSIS — K6289 Other specified diseases of anus and rectum: Secondary | ICD-10-CM

## 2020-02-05 NOTE — Progress Notes (Signed)
Subjective:     [name]  Patient present back for follow up. Still having some soilage of stool.  Concerned that he may have hemorrhoids.  Notices some blood on toilet paper.  No clots in toilet bowl. Objective:    BP (!) 169/73   Pulse (!) 54   Temp (!) 97.5 F (36.4 C) (Oral)   Resp 16   Ht 6\' 2"  (1.88 m)   Wt 214 lb (97.1 kg)   SpO2 98%   BMI 27.48 kg/m   General:  alert, cooperative and no distress  Rectal:  No hemorrhoids present.  No blood.  Some superficial mucosal prolapse present, but not significant.  Poor sphincter tone noted.     Assessment:    Decreased sphincter tone, stool incontinence    Plan:   Literature given concerning various conservative treatments for stool incontinence.  Kegel maneuvers.  Increase fiber in diet.  May need referral to GI clinic for further workup as needed.  Follow up here as needed.

## 2020-02-05 NOTE — Patient Instructions (Signed)
Fibercon, Metamucil, Citrucel   Fecal Incontinence Fecal incontinence, also called accidental bowel leakage, is not being able to control your bowels. This condition happens because the nerves or muscles around the anus do not work the way they should. This affects their ability to hold stool (feces). What are the causes? This condition may be caused by:  Damage to the muscles at the end of the rectum (sphincter).  Damage to the nerves that control bowel movements.  Diarrhea.  Chronic constipation.  Pelvic floor dysfunction. This means the muscles in the pelvis do not work well.  Loss of bowel storage capacity. This occurs when the rectum can no longer stretch in size in order to store feces.  Inflammatory bowel disease (IBD), such as Crohn's disease.  Irritable bowel syndrome (IBS). What increases the risk? You are more likely to develop this condition if you:  Were born with bowels or a pelvis that did not form correctly.  Have had rectal surgery.  Have had radiation treatment for certain cancers.  Have been pregnant, had a vaginal delivery, or had surgery that damaged the pelvic floor muscles.  Had a complicated childbirth, spinal cord injury, or other trauma that caused nerve damage.  Have a condition that can affect nerve function, such as diabetes, Parkinson's disease, or multiple sclerosis.  Have a condition where the rectum drops down into the anus or vagina (prolapse).  Are 66 years of age or older. What are the signs or symptoms? The main symptom of this condition is not being able to control your bowels. You also might not be able to get to the bathroom before a bowel movement. How is this diagnosed? This condition is diagnosed with a medical history and physical exam. You may also have other tests, including:  Blood tests.  Urine tests.  A rectal exam.  Ultrasound.  MRI.  Colonoscopy. This is an exam that looks at your large intestine  (colon).  Anal manometry. This is a test that measures the strength of the anal sphincter.  Anal electromyogram (EMG). This is a test that uses small electrodes to check for nerve damage. How is this treated? Treatment for this condition depends on the cause and severity. Treatment may also focus on addressing any underlying causes of this condition. Treatment may include:  Medicines. This may include medicines to: ? Prevent diarrhea. ? Help with constipation (bulk-forming laxatives). ? Treat any underlying conditions.  Biofeedback therapy. This can help to retrain muscles that are affected.  Fiber supplements. These can help manage your bowel movements.  Nerve stimulation.  Injectable gel to promote tissue growth and better muscle control.  Surgery. You may need: ? Sphincter repair surgery. ? Diversion surgery. This procedure lets feces pass out of your body through a hole in your abdomen. Follow these instructions at home: Eating and drinking   Follow instructions from your health care provider about any eating or drinking restrictions. ? Work with a dietitian to come up with a healthy diet that will help you avoid the foods that can make your condition worse. ? Keep a diet diary to find out which foods or drinks could be making your condition worse.  Drink enough fluid to keep your urine pale yellow. Lifestyle  Do not use any products that contain nicotine or tobacco, such as cigarettes and e-cigarettes. If you need help quitting, ask your health care provider. This may help your condition.  If you are overweight, talk with your health care provider about how to safely  lose weight. This may help your condition.  Increase your physical activity as told by your health care provider. This may help your condition. Always talk with your health care provider before starting a new exercise program.  Carry a change of clothes and supplies to clean up quickly if you have an episode  of fetal incontinence.  Consider joining a fecal incontinence support group. You can find a support group online or in your local community. General instructions   Take over-the-counter and prescription medicines only as told by your health care provider. This includes any supplements.  Apply a moisture barrier, such as petroleum jelly, to your rectum. This protects the skin from irritation caused by ongoing leaking or diarrhea.  Tell your health care provider if you are upset or depressed about your condition.  Keep all follow-up visits as told by your health care provider. This is important. Where to find more information  International Foundation for Functional Gastrointestinal Disorders: iffgd.[employer]: patients.gi.org Contact a health care provider if:  You have a fever.  You have redness, swelling, or pain around your rectum.  Your pain is getting worse or you lose feeling in your rectal area.  You have blood in your stool.  You feel sad or hopeless.  You avoid social or work situations. Get help right away if:  You stop having bowel movements.  You cannot eat or drink without vomiting.  You have rectal bleeding that does not stop.  You have severe pain that is getting worse.  You have symptoms of dehydration, including: ? Sleepiness or fatigue. ? Producing little or no urine, tears, or sweat. ? Dizziness. ? Dry mouth. ? Unusual irritability. ? Headache. ? Inability to think clearly. Summary  Fecal incontinence, also called accidental bowel leakage, is not being able to control your bowels. This condition happens because the nerves or muscles around the anus do not work the way they should.  Treatment varies depending on the cause and severity of your condition. Treatment may also focus on addressing any underlying causes of this condition.  Follow instructions from your health care provider about any eating or drinking  restrictions, lifestyle changes, and skin care.  Take over-the-counter and prescription medicines only as told by your health care provider. This includes any supplements.  Tell your health care provider if your symptoms worsen or if you are upset or depressed about your condition. This information is not intended to replace advice given to you by your health care provider. Make sure you discuss any questions you have with your health care provider. Document Revised: 11/15/2017 Document Reviewed: 11/15/2017 Elsevier Patient Education  [employer]. [employer] Content in Foods  See the following list for the dietary fiber content of some common foods. High-fiber foods High-fiber foods contain 4 grams or more (4g or more) of fiber per serving. They include:  Artichoke (fresh) -- 1 medium has 10.3g of fiber.  Baked beans, plain or vegetarian (canned) --  cup has 5.2g of fiber.  Blackberries or raspberries (fresh) --  cup has 4g of fiber.  Bran cereal --  cup has 8.6g of fiber.  Bulgur (cooked) --  cup has 4g of fiber.  Kidney beans (canned) --  cup has 6.8g of fiber.  Lentils (cooked) --  cup has 7.8g of fiber.  Pear (fresh) -- 1 medium has 5.1g of fiber.  Peas (frozen) --  cup has 4.4g of fiber.  Pinto beans (canned) --  cup has 5.5g  of fiber.  Pinto beans (dried and cooked) --  cup has 7.7g of fiber.  Potato with skin (baked) -- 1 medium has 4.4g of fiber.  Quinoa (cooked) --  cup has 5g of fiber.  Soybeans (canned, frozen, or fresh) --  cup has 5.1g of fiber. Moderate-fiber foods Moderate-fiber foods contain 1-4 grams (1-4g) of fiber per serving. They include:  Almonds -- 1 oz. has 3.5g of fiber.  Apple with skin -- 1 medium has 3.3g of fiber.  Applesauce, sweetened --  cup has 1.5g of fiber.  Bagel, plain -- one 4-inch (10-cm) bagel has 2g of fiber.  Banana -- 1 medium has 3.1g of fiber.  Broccoli (cooked) --  cup has 2.5g of fiber.  Carrots  (cooked) --  cup has 2.3g of fiber.  Corn (canned or frozen) --  cup has 2.1g of fiber.  Corn tortilla -- one 6-inch (15-cm) tortilla has 1.5g of fiber.  Green beans (canned) --  cup has 2g of fiber.  Instant oatmeal --  cup has about 2g of fiber.  Long-grain brown rice (cooked) -- 1 cup has 3.5g of fiber.  Macaroni, enriched (cooked) -- 1 cup has 2.5g of fiber.  Melon -- 1 cup has 1.4g of fiber.  Multigrain cereal --  cup has about 2-4g of fiber.  Orange -- 1 small has 3.1g of fiber.  Potatoes, mashed --  cup has 1.6g of fiber.  Raisins -- 1/4 cup has 1.6g of fiber.  Squash --  cup has 2.9g of fiber.  Sunflower seeds --  cup has 1.1g of fiber.  Tomato -- 1 medium has 1.5g of fiber.  Vegetable or soy patty -- 1 has 3.4g of fiber.  Whole-wheat bread -- 1 slice has 2g of fiber.  Whole-wheat spaghetti --  cup has 3.2g of fiber. Low-fiber foods Low-fiber foods contain less than 1 gram (less than 1g) of fiber per serving. They include:  Egg -- 1 large.  Flour tortilla -- one 6-inch (15-cm) tortilla.  Fruit juice --  cup.  Lettuce -- 1 cup.  Meat, poultry, or fish -- 1 oz.  Milk -- 1 cup.  Spinach (raw) -- 1 cup.  White bread -- 1 slice.  White rice --  cup.  Yogurt --  cup. Actual amounts of fiber in foods may be different depending on processing. Talk with your dietitian about how much fiber you need in your diet. This information is not intended to replace advice given to you by your health care provider. Make sure you discuss any questions you have with your health care provider. Document Revised: 02/24/2016 Document Reviewed: 08/26/2015 Elsevier Patient Education  [place].

## 2020-02-29 NOTE — Progress Notes (Deleted)
Cardiology Office Note  Date: 02/29/2020   ID: BONNY EGGER, DOB [birth year], MRN [id number]  PCP:  Redmond School, MD  Cardiologist:  No primary care provider on file. Electrophysiologist:  None   Chief Complaint: Atrial fibrillation  History of Present Illness: Jason Alvarez is a 79 y.o. male with a history of atrial fibrillation on flecainide, HTN, ITP, DM.  He saw [name] NP on 11/13/2019. He had recently had a mechanical fall a month prior to visit and was c/o of more fluttering with some elevated BP's. Had been seen by Neurology at [facility] with "vestibular stenosis". He had been recently started on ASA by PCP but was unsure as to why. He had complained of more bruising since being on combo of ASA and Warfarin. His DM was not being well controlled. Hgb A1c had been up to 15% but he made dietary changes and got it back down to 7%. CHA2DS2Vasc 4. To continue Flecainide 100 mg po bid. Toprol Xl 25 mg po bid. Amlodipine 10 mg daily along with HCTZ and Ramipril.  Past Medical History:  Diagnosis Date  . A-fib ([place])    paf with NSVT on MCOT, on coumadin, echo 01/31/08-nl lv, mild aortic valve sclerosis   . Aneurysm ([place])   . Anxiety   . Atrial fibrillation ([place])   . Carotid bruit    doppler 05/17/12-mild plaque with no diameter reduction  . Chronic anticoagulation   . Chronic ITP (idiopathic thrombocytopenia) ([facility]) 04/10/2012   Low grade thrombocytopenia consistent with chronic ITP not in need of therapy.   . DM (diabetes mellitus) ([place])   . Dysrhythmia    afib  . H/O cardiovascular stress test 01/31/08   no ishcemia, ef 61%  . History of kidney stones   . HTN (hypertension)   . Hypertension   . ITP (idiopathic thrombocytopenic purpura)   . Thrombocytopenia ([place])     Past Surgical History:  Procedure Laterality Date  . CIRCUMCISION    . COLONOSCOPY  06/25/2012   Procedure: COLONOSCOPY;  Surgeon: Jamesetta So, MD;  [facility];  Service:  Gastroenterology;  Laterality: N/A;  . COLONOSCOPY N/A 01/30/2017   Procedure: COLONOSCOPY;  Surgeon: Aviva Signs, MD;  Location: [facility];  Service: Gastroenterology;  Laterality: N/A;  . KNEE ARTHROSCOPY     right and left knee/1992 and 2000  . POLYPECTOMY  01/30/2017   Procedure: POLYPECTOMY;  Surgeon: Aviva Signs, MD;  Location: [facility];  Service: Gastroenterology;;  colon  . VASECTOMY      Current Outpatient Medications  Medication Sig Dispense Refill  . ALPRAZolam (XANAX) 1 MG tablet Taking 1 tablet by mouth 4 times daily    . amLODipine (NORVASC) 10 MG tablet Take 1 tablet (10 mg total) by mouth daily. 30 tablet 6  . finasteride (PROSCAR) 5 MG tablet Take 1 tablet (5 mg total) by mouth daily. (Patient not taking: Reported on 12/25/2019) 90 tablet 3  . flecainide (TAMBOCOR) 100 MG tablet Take 1 tablet (100 mg total) by mouth 2 (two) times daily. 180 tablet 2  . FLUAD 0.5 ML SUSY Inject as directed See admin instructions.    . gabapentin (NEURONTIN) 300 MG capsule Take 300 mg by mouth daily.    Marland Kitchen glipiZIDE-metformin (METAGLIP) 5-500 MG per tablet Take 1 tablet by mouth 2 (two) times daily after a meal.    . hydrochlorothiazide (HYDRODIURIL) 25 MG tablet Take 25 mg by mouth daily.      Marland Kitchen  hydrocortisone (ANUSOL-HC) 25 MG suppository Place 25 mg rectally 2 (two) times daily.    . metoprolol succinate (TOPROL-XL) 50 MG 24 hr tablet Take 1/2 tablet by mouth twice a day    . OneTouch Delica Lancets 18A MISC     . ONETOUCH VERIO test strip 1 each 2 (two) times daily.    . ramipril (ALTACE) 10 MG capsule Take 1 capsule (10 mg total) by mouth 2 (two) times daily. 180 capsule 2  . warfarin (COUMADIN) 5 MG tablet TAKE 1 TO 1&1/2 TABLET DAILY AS DIRECTED 120 tablet 1   No current facility-administered medications for this visit.   Allergies:  Penicillins   Social History: The patient  reports that he has never smoked. He has never used smokeless tobacco. He reports that he does  not drink alcohol and does not use drugs.   Family History: The patient's family history includes Cancer in his brother and sister; Melanoma in his mother; Prostate cancer in his father.   ROS:  Please see the history of present illness. Otherwise, complete review of systems is positive for {NONE DEFAULTED:18576::"none"}.  All other systems are reviewed and negative.   Physical Exam: VS:  There were no vitals taken for this visit., BMI There is no height or weight on file to calculate BMI.  Wt Readings from Last 3 Encounters:  02/05/20 214 lb (97.1 kg)  12/25/19 214 lb (97.1 kg)  12/12/19 217 lb (98.4 kg)    General: Patient appears comfortable at rest. HEENT: Conjunctiva and lids normal, oropharynx clear with moist mucosa. Neck: Supple, no elevated JVP or carotid bruits, no thyromegaly. Lungs: Clear to auscultation, nonlabored breathing at rest. Cardiac: Regular rate and rhythm, no S3 or significant systolic murmur, no pericardial rub. Abdomen: Soft, nontender, no hepatomegaly, bowel sounds present, no guarding or rebound. Extremities: No pitting edema, distal pulses 2+. Skin: Warm and dry. Musculoskeletal: No kyphosis. Neuropsychiatric: Alert and oriented x3, affect grossly appropriate.  ECG:  {EKG/Telemetry Strips Reviewed:[id number]}  Recent Labwork: 04/08/2019: ALT 7; AST 15; BUN 34; Creatinine, Ser 1.18; Potassium 4.1; Sodium 140; TSH 2.010     Component Value Date/Time   CHOL 163 04/08/2019 0758   TRIG 65 04/08/2019 0758   HDL 45 04/08/2019 0758   LDLCALC 105 (H) 04/08/2019 0758    Other Studies Reviewed Today:  Lower extremity arterial duplex 10/14/2019 Summary: Right: Resting right ankle-brachial index indicates noncompressible right lower extremity arteries. The right toe-brachial index is normal. RT great toe pressure = 157 mmHg. Left: Resting left ankle-brachial index indicates noncompressible left lower extremity arteries. The left toebrachial index is normal.  LT Great toe pressure = 131 mmHg.  Echocardiogram 03/20/2013 LV EF: 55% -  60   Indications:   427.31 Atrial Fibrillation.   History:  PMH: sleep apnea Risk factors: Hypertension.  Diabetes mellitus.   Study Conclusions   - Left ventricle: There was mild concentric hypertrophy.  Systolic function was normal. The estimated ejection  fraction was in the range of 55% to 65%, with beat to beat  variation. LV diastolic function cannot be assessed due to  a-fib. The E/e' ratio is <10, suggesting normal LV filling  pressure.  - Mitral valve: Mildly thickened leaflets . Mild  regurgitation.  - Left atrium: The atrium was normal in size.  - Right ventricle: The cavity size was normal. Wall  thickness was normal. The moderator band was prominent.  Systolic function was normal.  - Right atrium: The atrium was normal in size.  -  Tricuspid valve: Trivial regurgitation.  - Pulmonary arteries: PA peak pressure: 58mm Hg (S).  - Inferior vena cava: The vessel was normal in size; the  respirophasic diameter changes were in the normal range (=  50%); findings are consistent with normal central venous  pressure.   Assessment and Plan:  1. Paroxysmal atrial fibrillation (HCC)   2. Essential hypertension    1. Paroxysmal atrial fibrillation (HCC) ***  2. Essential hypertension ***  Medication Adjustments/Labs and Tests Ordered: Current medicines are reviewed at length with the patient today.  Concerns regarding medicines are outlined above.   Disposition: Follow-up with ***  Signed, Levell July, NP 02/29/2020 8:45 PM    [place] [facility] [place], [place], [place] [address] Phone: [phone]; Fax: [phone]

## 2020-03-01 ENCOUNTER — Ambulatory Visit (INDEPENDENT_AMBULATORY_CARE_PROVIDER_SITE_OTHER): Payer: Medicare HMO | Admitting: *Deleted

## 2020-03-01 DIAGNOSIS — I4891 Unspecified atrial fibrillation: Secondary | ICD-10-CM | POA: Diagnosis not present

## 2020-03-01 DIAGNOSIS — I480 Paroxysmal atrial fibrillation: Secondary | ICD-10-CM | POA: Diagnosis not present

## 2020-03-01 DIAGNOSIS — I10 Essential (primary) hypertension: Secondary | ICD-10-CM

## 2020-03-01 DIAGNOSIS — Z5181 Encounter for therapeutic drug level monitoring: Secondary | ICD-10-CM

## 2020-03-01 LAB — POCT INR: INR: 3.3 — AB (ref 2.0–3.0)

## 2020-03-01 NOTE — Patient Instructions (Signed)
Hold warfarin tonight then resume 1 tablet daily except 1.5 tablets on Sundays and Wednesdays   Repeat INR in 6 weeks.

## 2020-04-14 ENCOUNTER — Ambulatory Visit (INDEPENDENT_AMBULATORY_CARE_PROVIDER_SITE_OTHER): Payer: Medicare HMO | Admitting: *Deleted

## 2020-04-14 DIAGNOSIS — Z5181 Encounter for therapeutic drug level monitoring: Secondary | ICD-10-CM

## 2020-04-14 DIAGNOSIS — I4891 Unspecified atrial fibrillation: Secondary | ICD-10-CM

## 2020-04-14 LAB — POCT INR: INR: 2.2 (ref 2.0–3.0)

## 2020-04-14 NOTE — Patient Instructions (Signed)
Continue warfarin 1 tablet daily except 1.5 tablets on Sundays and Wednesdays   Repeat INR in 6 weeks.  

## 2020-05-13 ENCOUNTER — Ambulatory Visit (HOSPITAL_COMMUNITY)
Admission: RE | Admit: 2020-05-13 | Discharge: 2020-05-13 | Disposition: A | Discharge status: Home / Self Care | Payer: Medicare HMO | Source: Ambulatory Visit | Attending: Nurse Practitioner | Admitting: Nurse Practitioner

## 2020-05-13 ENCOUNTER — Other Ambulatory Visit: Payer: Self-pay

## 2020-05-13 ENCOUNTER — Encounter (HOSPITAL_COMMUNITY): Payer: Self-pay | Admitting: Nurse Practitioner

## 2020-05-13 VITALS — BP 168/62 | HR 51 | Ht 74.0 in | Wt 218.0 lb

## 2020-05-13 DIAGNOSIS — I10 Essential (primary) hypertension: Secondary | ICD-10-CM | POA: Insufficient documentation

## 2020-05-13 DIAGNOSIS — D6869 Other thrombophilia: Secondary | ICD-10-CM

## 2020-05-13 DIAGNOSIS — Z7901 Long term (current) use of anticoagulants: Secondary | ICD-10-CM | POA: Insufficient documentation

## 2020-05-13 DIAGNOSIS — I480 Paroxysmal atrial fibrillation: Secondary | ICD-10-CM | POA: Insufficient documentation

## 2020-05-13 NOTE — Progress Notes (Signed)
Patient ID: Jason Alvarez, male   DOB: 05/09/1941, 79 y.o.   MRN: [id number]     Primary Care Physician: Redmond School, MD Referring Physician: Dr. Charlyne Petrin is a 79 y.o. male with a h/o PAF on flecainide. He reports that he had a mechanical fall one month ago  but did not suffer any significant injury, SInce then he feels that he may have had more fluttering but it is very difficult for him to  describe.Marland Kitchen   His blood pressure is also elevated but on recheck 140 /50. He shows me his BP readings many of which are showing systolics of 767-209. He was on this dose in the past but he lowered as he felt BP readings were getting too low. He tells me that he was started on a baby asa a few months ago by his PCP, he did not know why. I see in Care everywhere that he was seen in October for dizziness by Neurology at [facility] and was found to have vestibular stenosis so may be 2/2 to this. He feels he is bruising more with also being on warfarin, CHA2DS2VASc score of at least 4.  He also admits that his blood glucose not too long ago got up to 15. He made some dietary changes and brought  It down to 7 but has slipped again in his dietary control.  F/u in afib clinic, 4/29. He remembers only one short afib episode otherwise, flecainide continues to keep him in rhythm. His BP is always elevated in the office, but he shows me his log from home  and  His BP's run 120-130 with a rare 470 to 962 systolic. He has occasional leg weakness, was seen by V/V and circulation confirmed to be ok. It is intermittent and other times, his legs feel strong. His wife is sick and he has  to do the majority of the house work. He had las 6 motnhs ago with PCP. He continues on warfarin with last INR 4/26 at 2.6. no bleeding issues. CHA2DS2VASc score is 4. He has had both covid shots.   F/u in afib clinic, 05/13/20. He remains in SR on flecainide .No afib to report. I reviewed his BP's from  home and the majority of his  systolic readings are below 140, he has a rare 836-629 systolic readings. No other issues discussed today.   Today, , negative for  chest pain, shortness of breath, orthopnea, PND, lower extremity edema, dizziness, presyncope, syncope, or neurologic sequela. The patient is tolerating medications without difficulties and is otherwise without complaint today.   Past Medical History:  Diagnosis Date  . A-fib ([place])    paf with NSVT on MCOT, on coumadin, echo 01/31/08-nl lv, mild aortic valve sclerosis   . Aneurysm ([place])   . Anxiety   . Atrial fibrillation ([place])   . Carotid bruit    doppler 05/17/12-mild plaque with no diameter reduction  . Chronic anticoagulation   . Chronic ITP (idiopathic thrombocytopenia) ([facility]) 04/10/2012   Low grade thrombocytopenia consistent with chronic ITP not in need of therapy.   . DM (diabetes mellitus) ([place])   . Dysrhythmia    afib  . H/O cardiovascular stress test 01/31/08   no ishcemia, ef 61%  . History of kidney stones   . HTN (hypertension)   . Hypertension   . ITP (idiopathic thrombocytopenic purpura)   . Thrombocytopenia ([place])    Past Surgical History:  Procedure Laterality Date  . CIRCUMCISION    .  COLONOSCOPY  06/25/2012   Procedure: COLONOSCOPY;  Surgeon: Jamesetta So, MD;  [facility];  Service: Gastroenterology;  Laterality: N/A;  . COLONOSCOPY N/A 01/30/2017   Procedure: COLONOSCOPY;  Surgeon: Aviva Signs, MD;  [facility];  Service: Gastroenterology;  Laterality: N/A;  . KNEE ARTHROSCOPY     right and left knee/1992 and 2000  . POLYPECTOMY  01/30/2017   Procedure: POLYPECTOMY;  Surgeon: Aviva Signs, MD;  [facility];  Service: Gastroenterology;;  colon  . VASECTOMY      Current Outpatient Medications  Medication Sig Dispense Refill  . ALPRAZolam (XANAX) 1 MG tablet Taking 1 tablet by mouth 4 times daily    . amLODipine (NORVASC) 10 MG tablet Take 1 tablet (10 mg total) by mouth daily. 30 tablet 6    . finasteride (PROSCAR) 5 MG tablet Take 1 tablet (5 mg total) by mouth daily. 90 tablet 3  . flecainide (TAMBOCOR) 100 MG tablet Take 1 tablet (100 mg total) by mouth 2 (two) times daily. 180 tablet 2  . FLUAD 0.5 ML SUSY Inject as directed See admin instructions.    Marland Kitchen glipiZIDE-metformin (METAGLIP) 5-500 MG per tablet Take 1 tablet by mouth 2 (two) times daily after a meal.    . hydrochlorothiazide (HYDRODIURIL) 25 MG tablet Take 25 mg by mouth daily.      . metoprolol succinate (TOPROL-XL) 50 MG 24 hr tablet Take 1/2 tablet by mouth twice a day    . OneTouch Delica Lancets 24O MISC     . ONETOUCH VERIO test strip 1 each 2 (two) times daily.    . ramipril (ALTACE) 10 MG capsule Take 1 capsule (10 mg total) by mouth 2 (two) times daily. 180 capsule 2  . warfarin (COUMADIN) 5 MG tablet TAKE 1 TO 1&1/2 TABLET DAILY AS DIRECTED 120 tablet 1  . gabapentin (NEURONTIN) 300 MG capsule Take 300 mg by mouth daily. (Patient not taking: Reported on 05/13/2020)    . hydrocortisone (ANUSOL-HC) 25 MG suppository Place 25 mg rectally 2 (two) times daily. (Patient not taking: Reported on 05/13/2020)     No current facility-administered medications for this encounter.    Allergies  Allergen Reactions  . Penicillins Other (See Comments)    Childhood allergy Has patient had a PCN reaction causing immediate rash, facial/tongue/throat swelling, SOB or lightheadedness with hypotension: Unknown Has patient had a PCN reaction causing severe rash involving mucus membranes or skin necrosis: Unknown Has patient had a PCN reaction that required hospitalization: Unknown Has patient had a PCN reaction occurring within the last 10 years: Unknown If all of the above answers are "NO", then may proceed with Cephalosporin use.    Social History   Socioeconomic History  . Marital status: Married    Spouse name: Not on file  . Number of children: Not on file  . Years of education: Not on file  . Highest education  level: Not on file  Occupational History  . Not on file  Tobacco Use  . Smoking status: Never Smoker  . Smokeless tobacco: Never Used  Vaping Use  . Vaping Use: Never used  Substance and Sexual Activity  . Alcohol use: No  . Drug use: No  . Sexual activity: Not on file  Other Topics Concern  . Not on file  Social History Narrative  . Not on file   Social Determinants of Health   Financial Resource Strain:   . Difficulty of Paying Living Expenses: Not on  file  Food Insecurity:   . Worried About Charity fundraiser in the Last Year: Not on file  . Ran Out of Food in the Last Year: Not on file  Transportation Needs:   . Lack of Transportation (Medical): Not on file  . Lack of Transportation (Non-Medical): Not on file  Physical Activity:   . Days of Exercise per Week: Not on file  . Minutes of Exercise per Session: Not on file  Stress:   . Feeling of Stress : Not on file  Social Connections:   . Frequency of Communication with Friends and Family: Not on file  . Frequency of Social Gatherings with Friends and Family: Not on file  . Attends Religious Services: Not on file  . Active Member of Clubs or Organizations: Not on file  . Attends Archivist Meetings: Not on file  . Marital Status: Not on file  Intimate Partner Violence:   . Fear of Current or Ex-Partner: Not on file  . Emotionally Abused: Not on file  . Physically Abused: Not on file  . Sexually Abused: Not on file    Family History  Problem Relation Age of Onset  . Prostate cancer Father   . Cancer Sister        breast  . Cancer Brother        lung  . Melanoma Mother     ROS- All systems are reviewed and negative except as per the HPI above  Physical Exam: Vitals:   05/13/20 0830  BP: (!) 168/62  Pulse: (!) 51  Weight: 98.9 kg  Height: 6\' 2"  (1.88 m)    GEN- The patient is well appearing, alert and oriented x 3 today.   Head- normocephalic, atraumatic Eyes-  Sclera clear, conjunctiva  pink Ears- hearing intact Oropharynx- clear Neck- supple, no JVP Lymph- no cervical lymphadenopathy Lungs- Clear to ausculation bilaterally, normal work of breathing Heart- Regular rate and rhythm, no murmurs, rubs or gallops, PMI not laterally displaced Extremities- no clubbing, cyanosis, or edema MS- no significant deformity or atrophy Skin- no rash or lesion Psych- euthymic mood, full affect Neuro- strength and sensation are intact  EKG- Sinus brady at 51 bpm, pr int 280 ms  qrs int 122 ms, qtc 449 Epic records reviewed  Assessment and Plan: 1. Paroxysmal atrial fibrillation Low burden  Continue Flecainide 100 mg bid Continue metoprolol succinate  25 mg bid   2.HTN BP is elevated today better on recheck but readings at home are controlled  Continue  Amlodipine at 10 mg daily hctz, ramipril  and continue with BP checks at home  3. Chadsvasc score of at least 4 Continue warfarin, followed by [facility] office    F/u 6 months   Geroge Baseman. Mccartney Brucks, [facility] [address] [place], [place] [address] [phone]

## 2020-05-19 ENCOUNTER — Other Ambulatory Visit (HOSPITAL_COMMUNITY): Payer: Self-pay | Admitting: Nurse Practitioner

## 2020-05-19 ENCOUNTER — Other Ambulatory Visit: Payer: Self-pay | Admitting: Internal Medicine

## 2020-05-27 ENCOUNTER — Ambulatory Visit (INDEPENDENT_AMBULATORY_CARE_PROVIDER_SITE_OTHER): Payer: Medicare HMO | Admitting: *Deleted

## 2020-05-27 DIAGNOSIS — Z5181 Encounter for therapeutic drug level monitoring: Secondary | ICD-10-CM | POA: Diagnosis not present

## 2020-05-27 DIAGNOSIS — I4891 Unspecified atrial fibrillation: Secondary | ICD-10-CM

## 2020-05-27 LAB — POCT INR: INR: 2.5 (ref 2.0–3.0)

## 2020-05-27 NOTE — Patient Instructions (Signed)
Continue warfarin 1 tablet daily except 1.5 tablets on Sundays and Wednesdays   Repeat INR in 6 weeks.

## 2020-06-04 ENCOUNTER — Other Ambulatory Visit: Payer: Self-pay

## 2020-06-04 DIAGNOSIS — R972 Elevated prostate specific antigen [PSA]: Secondary | ICD-10-CM

## 2020-06-09 ENCOUNTER — Other Ambulatory Visit: Payer: Self-pay

## 2020-06-09 ENCOUNTER — Other Ambulatory Visit: Payer: Medicare HMO

## 2020-06-09 DIAGNOSIS — R972 Elevated prostate specific antigen [PSA]: Secondary | ICD-10-CM

## 2020-06-10 LAB — PSA: Prostate Specific Ag, Serum: 7.5 ng/mL — ABNORMAL HIGH (ref 0.0–4.0)

## 2020-06-17 ENCOUNTER — Ambulatory Visit: Payer: Medicare HMO | Admitting: General Surgery

## 2020-06-18 ENCOUNTER — Ambulatory Visit: Payer: Medicare HMO | Admitting: Urology

## 2020-07-13 ENCOUNTER — Other Ambulatory Visit: Payer: Self-pay

## 2020-07-13 ENCOUNTER — Ambulatory Visit (INDEPENDENT_AMBULATORY_CARE_PROVIDER_SITE_OTHER): Payer: Medicare HMO | Admitting: *Deleted

## 2020-07-13 DIAGNOSIS — Z5181 Encounter for therapeutic drug level monitoring: Secondary | ICD-10-CM | POA: Diagnosis not present

## 2020-07-13 DIAGNOSIS — I4891 Unspecified atrial fibrillation: Secondary | ICD-10-CM

## 2020-07-13 LAB — POCT INR: INR: 3 (ref 2.0–3.0)

## 2020-07-13 NOTE — Patient Instructions (Signed)
Continue warfarin 1 tablet daily except 1.5 tablets on Sundays and Wednesdays   Repeat INR in 6 weeks.  

## 2020-07-26 ENCOUNTER — Other Ambulatory Visit (HOSPITAL_COMMUNITY): Payer: Self-pay | Admitting: Nurse Practitioner

## 2020-08-10 NOTE — Progress Notes (Signed)
Subjective:  1. Elevated PSA   2. BPH with urinary obstruction   3. Urinary frequency   4. Balanitis      Jason Alvarez has had occasional recurrent balanitis but it has responded to nystatin.    Jason Alvarez returns today in f/u for his several year history of BPH with BOO and an elevated PSA. His PSA is 7.5 which is fairly stable.  It was 6.1 in 11/26/18.  It is 8.6 with a 34% f/t ratio on 12/04/19 which is in his usual range.  He had a prostate MRI in the fall of 2017 that was negative but his prostate was very large. He was given a script for finasteride but he never started it.  His IPSS is 20.  He has an episode of enuresis recently but that hasn't recurred.  He has no associated signs or symptoms.    IPSS    Row Name 08/12/20 0900         International Prostate Symptom Score   How often have you had the sensation of not emptying your bladder? Almost always     How often have you had to urinate less than every two hours? About half the time     How often have you found you stopped and started again several times when you urinated? About half the time     How often have you found it difficult to postpone urination? Less than half the time     How often have you had a weak urinary stream? Almost always     How often have you had to strain to start urination? Less than 1 in 5 times     How many times did you typically get up at night to urinate? 1 Time     Total IPSS Score 20           Quality of Life due to urinary symptoms   If you were to spend the rest of your life with your urinary condition just the way it is now how would you feel about that? Unhappy           ROS:  ROS:  A complete review of systems was performed.  All systems are negative except for pertinent findings as noted.   Review of Systems  All other systems reviewed and are negative.   Allergies  Allergen Reactions   Penicillins Other (See Comments)    Childhood allergy Has patient had a PCN reaction  causing immediate rash, facial/tongue/throat swelling, SOB or lightheadedness with hypotension: Unknown Has patient had a PCN reaction causing severe rash involving mucus membranes or skin necrosis: Unknown Has patient had a PCN reaction that required hospitalization: Unknown Has patient had a PCN reaction occurring within the last 10 years: Unknown If all of the above answers are "NO", then may proceed with Cephalosporin use.    Outpatient Encounter Medications as of 08/12/2020  Medication Sig   ALPRAZolam (XANAX) 1 MG tablet Taking 1 tablet by mouth 4 times daily   amLODipine (NORVASC) 10 MG tablet Take 1 tablet (10 mg total) by mouth daily.   finasteride (PROSCAR) 5 MG tablet Take 1 tablet (5 mg total) by mouth daily.   flecainide (TAMBOCOR) 100 MG tablet TAKE ONE TABLET BY MOUTH TWICE DAILY   FLUAD 0.5 ML SUSY Inject as directed See admin instructions.   glipiZIDE-metformin (METAGLIP) 5-500 MG per tablet Take 1 tablet by mouth 2 (two) times daily after a meal.   hydrochlorothiazide (HYDRODIURIL) 25 MG  tablet Take 25 mg by mouth daily.   HYDROcodone-acetaminophen (NORCO) 10-325 MG tablet Take by mouth.   hydrocortisone (ANUSOL-HC) 25 MG suppository Place 25 mg rectally 2 (two) times daily.   metoprolol succinate (TOPROL-XL) 50 MG 24 hr tablet TAKE ONE (1) TABLET EACH DAY   OneTouch Delica Lancets 99991111 MISC    ONETOUCH VERIO test strip 1 each 2 (two) times daily.   ramipril (ALTACE) 10 MG capsule TAKE ONE CAPSULE BY MOUTH TWICE A DAY   warfarin (COUMADIN) 5 MG tablet TAKE 1 TO 1&1/2 TABLET DAILY AS DIRECTED   [DISCONTINUED] gabapentin (NEURONTIN) 300 MG capsule Take 300 mg by mouth daily. (Patient not taking: Reported on 05/13/2020)   No facility-administered encounter medications on file as of 08/12/2020.    Past Medical History:  Diagnosis Date   A-fib ([place])    paf with NSVT on MCOT, on coumadin, echo 01/31/08-nl lv, mild aortic valve sclerosis    Aneurysm ([facility])     Anxiety    Atrial fibrillation ([facility])    Carotid bruit    doppler 05/17/12-mild plaque with no diameter reduction   Chronic anticoagulation    Chronic ITP (idiopathic thrombocytopenia) ([facility]) 04/10/2012   Low grade thrombocytopenia consistent with chronic ITP not in need of therapy.    DM (diabetes mellitus) ([place])    Dysrhythmia    afib   H/O cardiovascular stress test 01/31/08   no ishcemia, ef 61%   History of kidney stones    HTN (hypertension)    Hypertension    ITP (idiopathic thrombocytopenic purpura)    Thrombocytopenia ([place])     Past Surgical History:  Procedure Laterality Date   CIRCUMCISION     COLONOSCOPY  06/25/2012   Procedure: COLONOSCOPY;  Surgeon: Jamesetta So, MD;  [facility];  Service: Gastroenterology;  Laterality: N/A;   COLONOSCOPY N/A 01/30/2017   Procedure: COLONOSCOPY;  Surgeon: Aviva Signs, MD;  [facility];  Service: Gastroenterology;  Laterality: N/A;   KNEE ARTHROSCOPY     right and left knee/1992 and 2000   POLYPECTOMY  01/30/2017   Procedure: POLYPECTOMY;  Surgeon: Aviva Signs, MD;  [facility];  Service: Gastroenterology;;  colon   VASECTOMY      Social History   Socioeconomic History   Marital status: Married    Spouse name: Not on file   Number of children: Not on file   Years of education: Not on file   Highest education level: Not on file  Occupational History   Not on file  Tobacco Use   Smoking status: Never Smoker   Smokeless tobacco: Never Used  Vaping Use   Vaping Use: Never used  Substance and Sexual Activity   Alcohol use: No   Drug use: No   Sexual activity: Not on file  Other Topics Concern   Not on file  Social History Narrative   Not on file   Social Determinants of Health   Financial Resource Strain: Not on file  Food Insecurity: Not on file  Transportation Needs: Not on file  Physical Activity: Not on file  Stress: Not on file  Social  Connections: Not on file  Intimate Partner Violence: Not on file    Family History  Problem Relation Age of Onset   Prostate cancer Father    Cancer Sister        breast   Cancer Brother        lung   Melanoma Mother  Objective: Vitals:   08/12/20 0852  BP: (!) 145/70  Pulse: (!) 53  Temp: 98.4 F (36.9 C)     Physical Exam  Lab Results:  Results for orders placed or performed in visit on 08/12/20 (from the past 24 hour(s))  Urinalysis, Routine w reflex microscopic     Status: Abnormal   Collection Time: 08/12/20  9:20 AM  Result Value Ref Range   Specific Gravity, UA 1.015 1.005 - 1.030   pH, UA 5.0 5.0 - 7.5   Color, UA Yellow Yellow   Appearance Ur Clear Clear   Leukocytes,UA Negative Negative   Protein,UA 1+ (A) Negative/Trace   Glucose, UA 3+ (A) Negative   Ketones, UA Negative Negative   RBC, UA Trace (A) Negative   Bilirubin, UA Negative Negative   Urobilinogen, Ur 0.2 0.2 - 1.0 mg/dL   Nitrite, UA Negative Negative   Microscopic Examination See below:    Narrative   Performed at:  [facility] [address] [place], [place]  [id number] Lab Director: Mina Marble MT, Phone:  [phone]  Microscopic Examination     Status: None   Collection Time: 08/12/20  9:20 AM   Urine  Result Value Ref Range   WBC, UA None seen 0 - 5 /hpf   RBC 0-2 0 - 2 /hpf   Epithelial Cells (non renal) None seen 0 - 10 /hpf   Renal Epithel, UA None seen None seen /hpf   Mucus, UA Present Not Estab.   Bacteria, UA None seen None seen/Few   Narrative   Performed at:  [facility] [address], [place], [place]  [id number] Lab Director: [place], Phone:  [phone]    BMET No results for input(s): NA, K, CL, CO2, GLUCOSE, BUN, CREATININE, CALCIUM in the last 72 hours. PSA Lab Results  Component Value Date   PSA1 7.5 (H) 06/09/2020    PSA  Date Value Ref Range Status  12/04/2019 8.6  Final  11/26/2018 6.1  Final    No results found for: TESTOSTERONE  UA has 0-2 RBC's. Studies/Results: No results found.    Assessment & Plan: BPH with BOO.  He has  Stable LUTS and a stable PSA.Marland Kitchen   He still has the  finasteride and I encouraged him to start it.  He will return in 3 months with a PSA.  Balanitis has resolved with nystatin    No orders of the defined types were placed in this encounter.    Orders Placed This Encounter  Procedures   Microscopic Examination   Urinalysis, Routine w reflex microscopic   PSA, total and free    Standing Status:   Future    Standing Expiration Date:   12/10/2020      Return in about 3 months (around 11/10/2020).   CC: Redmond School, MD      Irine Seal 08/12/2020

## 2020-08-12 ENCOUNTER — Other Ambulatory Visit: Payer: Self-pay

## 2020-08-12 ENCOUNTER — Encounter: Payer: Self-pay | Admitting: Urology

## 2020-08-12 ENCOUNTER — Ambulatory Visit (INDEPENDENT_AMBULATORY_CARE_PROVIDER_SITE_OTHER): Payer: Medicare HMO | Admitting: Urology

## 2020-08-12 VITALS — BP 145/70 | HR 53 | Temp 98.4°F | Ht 74.0 in | Wt 216.6 lb

## 2020-08-12 DIAGNOSIS — R350 Frequency of micturition: Secondary | ICD-10-CM | POA: Diagnosis not present

## 2020-08-12 DIAGNOSIS — R972 Elevated prostate specific antigen [PSA]: Secondary | ICD-10-CM | POA: Diagnosis not present

## 2020-08-12 DIAGNOSIS — N401 Enlarged prostate with lower urinary tract symptoms: Secondary | ICD-10-CM

## 2020-08-12 DIAGNOSIS — N138 Other obstructive and reflux uropathy: Secondary | ICD-10-CM

## 2020-08-12 DIAGNOSIS — N481 Balanitis: Secondary | ICD-10-CM

## 2020-08-12 LAB — MICROSCOPIC EXAMINATION
Bacteria, UA: NONE SEEN
Epithelial Cells (non renal): NONE SEEN /hpf (ref 0–10)
Renal Epithel, UA: NONE SEEN /hpf
WBC, UA: NONE SEEN /hpf (ref 0–5)

## 2020-08-12 LAB — URINALYSIS, ROUTINE W REFLEX MICROSCOPIC
Bilirubin, UA: NEGATIVE
Ketones, UA: NEGATIVE
Leukocytes,UA: NEGATIVE
Nitrite, UA: NEGATIVE
Specific Gravity, UA: 1.015 (ref 1.005–1.030)
Urobilinogen, Ur: 0.2 mg/dL (ref 0.2–1.0)
pH, UA: 5 (ref 5.0–7.5)

## 2020-08-12 NOTE — Progress Notes (Signed)
Urological Symptom Review  Patient is experiencing the following symptoms: Hard to postpone urination Get up at night to urinate Leakage of urine Weak stream Erection problems (male only)   Review of Systems  Gastrointestinal (upper)  : Negative for upper GI symptoms  Gastrointestinal (lower) : Diarrhea sometimes  Constitutional : Negative for symptoms  Skin: Negative for skin symptoms  Eyes: Negative for eye symptoms  Ear/Nose/Throat : Sinus problems  Hematologic/Lymphatic: Easy bruising  (take blood thinner)  Cardiovascular : Negative for cardiovascular symptoms  Respiratory : Negative for respiratory symptoms  Endocrine: Negative for endocrine symptoms  Musculoskeletal: Negative for musculoskeletal symptoms  Neurological: Negative for neurological symptoms  Psychologic: Negative for psychiatric symptoms

## 2020-08-24 ENCOUNTER — Ambulatory Visit (INDEPENDENT_AMBULATORY_CARE_PROVIDER_SITE_OTHER): Payer: Medicare HMO | Admitting: *Deleted

## 2020-08-24 ENCOUNTER — Other Ambulatory Visit: Payer: Self-pay

## 2020-08-24 DIAGNOSIS — Z5181 Encounter for therapeutic drug level monitoring: Secondary | ICD-10-CM

## 2020-08-24 DIAGNOSIS — I4891 Unspecified atrial fibrillation: Secondary | ICD-10-CM

## 2020-08-24 LAB — POCT INR: INR: 3.6 — AB (ref 2.0–3.0)

## 2020-08-24 NOTE — Patient Instructions (Signed)
Hold warfarin tonight then resume 1 tablet daily except 1.5 tablets on Sundays and Wednesdays Eat extra greens/salad today   Repeat INR in 3 weeks.

## 2020-09-14 ENCOUNTER — Ambulatory Visit (INDEPENDENT_AMBULATORY_CARE_PROVIDER_SITE_OTHER): Payer: Medicare HMO | Admitting: *Deleted

## 2020-09-14 DIAGNOSIS — I4891 Unspecified atrial fibrillation: Secondary | ICD-10-CM | POA: Diagnosis not present

## 2020-09-14 DIAGNOSIS — Z5181 Encounter for therapeutic drug level monitoring: Secondary | ICD-10-CM | POA: Diagnosis not present

## 2020-09-14 LAB — POCT INR: INR: 3.7 — AB (ref 2.0–3.0)

## 2020-09-14 NOTE — Patient Instructions (Signed)
Hold warfarin tonight then decrease dose to 1 tablet daily  Eat extra greens/salad today   Repeat INR in 3 weeks.

## 2020-09-20 ENCOUNTER — Encounter: Payer: Self-pay | Admitting: Internal Medicine

## 2020-10-05 ENCOUNTER — Ambulatory Visit (INDEPENDENT_AMBULATORY_CARE_PROVIDER_SITE_OTHER): Payer: Medicare HMO | Admitting: *Deleted

## 2020-10-05 DIAGNOSIS — Z5181 Encounter for therapeutic drug level monitoring: Secondary | ICD-10-CM

## 2020-10-05 DIAGNOSIS — I4891 Unspecified atrial fibrillation: Secondary | ICD-10-CM | POA: Diagnosis not present

## 2020-10-05 LAB — POCT INR: INR: 2.3 (ref 2.0–3.0)

## 2020-10-05 NOTE — Patient Instructions (Signed)
Continue warfarin 1 tablet daily  Continue greens/salad    Repeat INR in 4 weeks.

## 2020-10-27 ENCOUNTER — Encounter: Payer: Self-pay | Admitting: Internal Medicine

## 2020-10-27 ENCOUNTER — Telehealth: Payer: Self-pay

## 2020-10-27 ENCOUNTER — Telehealth: Payer: Self-pay | Admitting: Internal Medicine

## 2020-10-27 ENCOUNTER — Other Ambulatory Visit: Payer: Self-pay

## 2020-10-27 ENCOUNTER — Ambulatory Visit: Payer: Medicare HMO | Admitting: Internal Medicine

## 2020-10-27 DIAGNOSIS — R151 Fecal smearing: Secondary | ICD-10-CM

## 2020-10-27 DIAGNOSIS — R159 Full incontinence of feces: Secondary | ICD-10-CM | POA: Insufficient documentation

## 2020-10-27 DIAGNOSIS — K625 Hemorrhage of anus and rectum: Secondary | ICD-10-CM

## 2020-10-27 NOTE — Telephone Encounter (Signed)
Noted Routing to [facility] , FYI Dr. Abbey Chatters

## 2020-10-27 NOTE — Patient Instructions (Signed)
We will schedule you for flexible sigmoidoscopy to further evaluate your stool incontinence.  If internal hemorrhoids are potentially the cause, we can consider hemorrhoid banding in the clinic afterwards.  I want you to start taking Benefiber daily.  Also recommend performing Kegel exercises to strengthen your anal sphincter tone.  Print out provided for you today.  At [facility] we value your feedback. You may receive a survey about your visit today. Please share your experience as we strive to create trusting relationships with our patients to provide genuine, compassionate, quality care.  We appreciate your understanding and patience as we review any laboratory studies, imaging, and other diagnostic tests that are ordered as we care for you. Our office policy is 5 business days for review of these results, and any emergent or urgent results are addressed in a timely manner for your best interest. If you do not hear from our office in 1 week, please contact us.   We also encourage the use of MyChart, which contains your medical information for your review as well. If you are not enrolled in this feature, an access code is on this after visit summary for your convenience. Thank you for allowing [place] to be involved in your care.  It was great to see you today!  I hope you have a great rest of your spring!!    Elon Alas. Abbey Chatters, D.O. Gastroenterology and Hepatology [facility]

## 2020-10-27 NOTE — Telephone Encounter (Signed)
Jason Palau, NP, pt was seen in office today by Dr. Abbey Chatters. Dr. Abbey Chatters wants pt to complete a flex sig . Is it ok for pt to hold Coumadin 5 days prior to procedure? Please advise.

## 2020-10-27 NOTE — Progress Notes (Signed)
Primary Care Physician:  Redmond School, MD Primary Gastroenterologist:  Dr. Abbey Chatters  Chief Complaint  Patient presents with  . Gas    Stool slips out with gas and when bending over    HPI:   Jason Alvarez is a 80 y.o. male who presents to the clinic today by referral from his primary care physician Dr. Gerarda Fraction for evaluation.  Patient states for the last 3 to 4 months he has had issues with stool incontinence/leakage.  States if he bends over in a certain position he will have loose stool in his underwear.  States this is progressively worsening.  Previously evaluated by surgery who recommended conservative measures.  Denies any melena, does note scant hematochezia on toilet paper at times.  Last colonoscopy 2019 with 1 small polyp removed.  Did not mention internal hemorrhoids on this report.  No NSAID use.  Denies chronic diarrhea for me.  States he will have relatively normal bowel movements in between these incidents.  Past Medical History:  Diagnosis Date  . A-fib ([place])    paf with NSVT on MCOT, on coumadin, echo 01/31/08-nl lv, mild aortic valve sclerosis   . Aneurysm ([place])   . Anxiety   . Atrial fibrillation ([place])   . Carotid bruit    doppler 05/17/12-mild plaque with no diameter reduction  . Chronic anticoagulation   . Chronic ITP (idiopathic thrombocytopenia) ([facility]) 04/10/2012   Low grade thrombocytopenia consistent with chronic ITP not in need of therapy.   . DM (diabetes mellitus) ([place])   . Dysrhythmia    afib  . H/O cardiovascular stress test 01/31/08   no ishcemia, ef 61%  . History of kidney stones   . HTN (hypertension)   . Hypertension   . ITP (idiopathic thrombocytopenic purpura)   . Thrombocytopenia ([place])     Past Surgical History:  Procedure Laterality Date  . CIRCUMCISION    . COLONOSCOPY  06/25/2012   Procedure: COLONOSCOPY;  Surgeon: Jamesetta So, MD;  [facility];  Service: Gastroenterology;  Laterality: N/A;  . COLONOSCOPY N/A 01/30/2017    Procedure: COLONOSCOPY;  Surgeon: Aviva Signs, MD;  [facility];  Service: Gastroenterology;  Laterality: N/A;  . KNEE ARTHROSCOPY     right and left knee/1992 and 2000  . POLYPECTOMY  01/30/2017   Procedure: POLYPECTOMY;  Surgeon: Aviva Signs, MD;  [facility];  Service: Gastroenterology;;  colon  . VASECTOMY      Current Outpatient Medications  Medication Sig Dispense Refill  . ALPRAZolam (XANAX) 1 MG tablet Taking 1 tablet by mouth 4 times daily    . amLODipine (NORVASC) 10 MG tablet Take 1 tablet (10 mg total) by mouth daily. 30 tablet 6  . flecainide (TAMBOCOR) 100 MG tablet TAKE ONE TABLET BY MOUTH TWICE DAILY 180 tablet 2  . glipiZIDE-metformin (METAGLIP) 5-500 MG per tablet Take 1 tablet by mouth 2 (two) times daily after a meal.    . hydrochlorothiazide (HYDRODIURIL) 25 MG tablet Take 25 mg by mouth daily.    . metoprolol succinate (TOPROL-XL) 50 MG 24 hr tablet TAKE ONE (1) TABLET EACH DAY 90 tablet 2  . OneTouch Delica Lancets 11B MISC     . ONETOUCH VERIO test strip 1 each 2 (two) times daily.    . ramipril (ALTACE) 10 MG capsule TAKE ONE CAPSULE BY MOUTH TWICE A DAY 180 capsule 2  . warfarin (COUMADIN) 5 MG tablet TAKE 1 TO 1&1/2 TABLET DAILY AS DIRECTED 120  tablet 2  . finasteride (PROSCAR) 5 MG tablet Take 1 tablet (5 mg total) by mouth daily. (Patient not taking: Reported on 10/27/2020) 90 tablet 3  . FLUAD 0.5 ML SUSY Inject as directed See admin instructions. (Patient not taking: Reported on 10/27/2020)    . HYDROcodone-acetaminophen (NORCO) 10-325 MG tablet Take by mouth. (Patient not taking: Reported on 10/27/2020)    . hydrocortisone (ANUSOL-HC) 25 MG suppository Place 25 mg rectally 2 (two) times daily. (Patient not taking: Reported on 10/27/2020)     No current facility-administered medications for this visit.    Allergies as of 10/27/2020 - Review Complete 10/27/2020  Allergen Reaction Noted  . Penicillins Other (See Comments)      Family History  Problem Relation Age of Onset  . Prostate cancer Father   . Cancer Sister        breast  . Cancer Brother        lung  . Melanoma Mother     Social History   Socioeconomic History  . Marital status: Married    Spouse name: Not on file  . Number of children: Not on file  . Years of education: Not on file  . Highest education level: Not on file  Occupational History  . Not on file  Tobacco Use  . Smoking status: Never Smoker  . Smokeless tobacco: Never Used  Vaping Use  . Vaping Use: Never used  Substance and Sexual Activity  . Alcohol use: No  . Drug use: No  . Sexual activity: Not on file  Other Topics Concern  . Not on file  Social History Narrative  . Not on file   Social Determinants of Health   Financial Resource Strain: Not on file  Food Insecurity: Not on file  Transportation Needs: Not on file  Physical Activity: Not on file  Stress: Not on file  Social Connections: Not on file  Intimate Partner Violence: Not on file    Subjective: Review of Systems  Constitutional: Negative for chills and fever.  HENT: Negative for congestion and hearing loss.   Eyes: Negative for blurred vision and double vision.  Respiratory: Negative for cough and shortness of breath.   Cardiovascular: Negative for chest pain and palpitations.  Gastrointestinal: Positive for diarrhea. Negative for abdominal pain, blood in stool, constipation, heartburn, melena and vomiting.       Stool incontinence  Genitourinary: Negative for dysuria and urgency.  Musculoskeletal: Negative for joint pain and myalgias.  Skin: Negative for itching and rash.  Neurological: Negative for dizziness and headaches.  Psychiatric/Behavioral: Negative for depression. The patient is not nervous/anxious.        Objective: BP (!) 153/62   Pulse (!) 58   Temp (!) 96.8 F (36 C) (Temporal)   Ht 6\' 2"  (1.88 m)   Wt 213 lb 12.8 oz (97 kg)   BMI 27.45 kg/m  Physical  Exam Constitutional:      Appearance: Normal appearance.  HENT:     Head: Normocephalic and atraumatic.  Eyes:     Extraocular Movements: Extraocular movements intact.     Conjunctiva/sclera: Conjunctivae normal.  Cardiovascular:     Rate and Rhythm: Normal rate and regular rhythm.  Pulmonary:     Effort: Pulmonary effort is normal.     Breath sounds: Normal breath sounds.  Abdominal:     General: Bowel sounds are normal.     Palpations: Abdomen is soft.  Musculoskeletal:        General: Normal range  of motion.     Cervical back: Normal range of motion and neck supple.  Skin:    General: Skin is warm.  Neurological:     General: No focal deficit present.     Mental Status: He is alert and oriented to person, place, and time.  Psychiatric:        Mood and Affect: Mood normal.        Behavior: Behavior normal.      Assessment: *Stool incontinence/leakage *Rectal bleeding  Plan: Etiology of patient's symptoms unclear.  Likely poor sphincter tone playing a role though cannot rule out underlying internal hemorrhoids.  Discussed options in depth with him today.    We will continue with conservative measures by adding Benefiber in his diet.  I have also printed off Kegal exercises for her to perform at home to strengthen his pelvic muscles.  Discussed that internal hemorrhoids could be contributing to his leakage especially if he is having bright red blood per rectum with bowel movements.  Discussed potential hemorrhoid banding with him and he is interested in having this done though we will need to perform a flexible sigmoidoscopy to further evaluate the rectal vault prior to doing so.  He understands and wishes to proceed.  He will need to hold his Coumadin for 5 days prior to procedure.  The risks including infection, bleed, or perforation as well as benefits, limitations, alternatives and imponderables have been reviewed with the patient. Questions have been answered. All  parties agreeable.  Thank you Dr. Gerarda Fraction for the kind referral.  10/27/2020 11:04 AM   Disclaimer: This note was dictated with voice recognition software. Similar sounding words can inadvertently be transcribed and may not be corrected upon review.

## 2020-10-27 NOTE — Telephone Encounter (Signed)
Please call patient at [phone]

## 2020-10-27 NOTE — Telephone Encounter (Signed)
Disregard message. Patient found what he was looking for.

## 2020-10-27 NOTE — Telephone Encounter (Signed)
noted 

## 2020-10-28 NOTE — Telephone Encounter (Signed)
Called and informed pt we received ok for him to hold Coumadin for 5 days prior to Flex Sig. Procedure scheduled for 12/14/20--AM. Orders entered.

## 2020-10-28 NOTE — Telephone Encounter (Signed)
Pre-op/covid test 12/10/20 at 10:45am. Appt letter mailed with procedure instructions.

## 2020-11-02 ENCOUNTER — Ambulatory Visit (INDEPENDENT_AMBULATORY_CARE_PROVIDER_SITE_OTHER): Payer: Medicare HMO | Admitting: *Deleted

## 2020-11-02 DIAGNOSIS — I4891 Unspecified atrial fibrillation: Secondary | ICD-10-CM | POA: Diagnosis not present

## 2020-11-02 DIAGNOSIS — Z5181 Encounter for therapeutic drug level monitoring: Secondary | ICD-10-CM | POA: Diagnosis not present

## 2020-11-02 LAB — POCT INR: INR: 3.2 — AB (ref 2.0–3.0)

## 2020-11-02 NOTE — Patient Instructions (Signed)
Hold warfarin tonight then resume 1 tablet daily  Continue greens/salad    Repeat INR in 4 weeks.

## 2020-11-04 ENCOUNTER — Other Ambulatory Visit: Payer: Self-pay

## 2020-11-04 ENCOUNTER — Encounter: Payer: Self-pay | Admitting: Urology

## 2020-11-04 ENCOUNTER — Ambulatory Visit (INDEPENDENT_AMBULATORY_CARE_PROVIDER_SITE_OTHER): Payer: Medicare HMO | Admitting: Urology

## 2020-11-04 VITALS — BP 157/70 | HR 65 | Temp 97.7°F | Ht 74.0 in | Wt 216.0 lb

## 2020-11-04 DIAGNOSIS — R972 Elevated prostate specific antigen [PSA]: Secondary | ICD-10-CM

## 2020-11-04 DIAGNOSIS — N481 Balanitis: Secondary | ICD-10-CM

## 2020-11-04 DIAGNOSIS — N138 Other obstructive and reflux uropathy: Secondary | ICD-10-CM

## 2020-11-04 DIAGNOSIS — R3129 Other microscopic hematuria: Secondary | ICD-10-CM

## 2020-11-04 DIAGNOSIS — R350 Frequency of micturition: Secondary | ICD-10-CM | POA: Diagnosis not present

## 2020-11-04 DIAGNOSIS — R351 Nocturia: Secondary | ICD-10-CM

## 2020-11-04 DIAGNOSIS — N401 Enlarged prostate with lower urinary tract symptoms: Secondary | ICD-10-CM | POA: Diagnosis not present

## 2020-11-04 LAB — MICROSCOPIC EXAMINATION
Bacteria, UA: NONE SEEN
Epithelial Cells (non renal): NONE SEEN /hpf (ref 0–10)
Renal Epithel, UA: NONE SEEN /hpf
WBC, UA: NONE SEEN /hpf (ref 0–5)

## 2020-11-04 LAB — URINALYSIS, ROUTINE W REFLEX MICROSCOPIC
Bilirubin, UA: NEGATIVE
Ketones, UA: NEGATIVE
Leukocytes,UA: NEGATIVE
Nitrite, UA: NEGATIVE
Specific Gravity, UA: 1.015 (ref 1.005–1.030)
Urobilinogen, Ur: 0.2 mg/dL (ref 0.2–1.0)
pH, UA: 5.5 (ref 5.0–7.5)

## 2020-11-04 MED ORDER — TAMSULOSIN HCL 0.4 MG PO CAPS: 0.4000 mg | ORAL_CAPSULE | Freq: Every day | ORAL | 11 refills | Status: DC

## 2020-11-04 NOTE — Progress Notes (Signed)
Urological Symptom Review  Patient is experiencing the following symptoms: Hard to postpone urination Get up at night to urinate Leakage of urine Weak stream   Review of Systems  Gastrointestinal (upper)  : Negative for upper GI symptoms  Gastrointestinal (lower) : Negative for lower GI symptoms  Constitutional : Fatigue  Skin: Negative for skin symptoms  Eyes: Negative for eye symptoms  Ear/Nose/Throat : Sinus problems  Hematologic/Lymphatic: Easy bruising  Cardiovascular : Chest pain  Respiratory : Negative for respiratory symptoms  Endocrine: Negative for endocrine symptoms  Musculoskeletal: Negative for musculoskeletal symptoms  Neurological: Negative for neurological symptoms  Psychologic: Anxiety Negative for psychiatric symptoms

## 2020-11-04 NOTE — Progress Notes (Signed)
Subjective:  1. Elevated PSA   2. BPH with urinary obstruction   3. Urinary frequency   4. Nocturia   5. Balanitis   6. Microhematuria         Jason Alvarez returns today in f/u for his several year history of BPH with BOO and an elevated PSA. His PSA is 7.5 which is fairly stable.  It was 6.1 in 11/26/18.  It is 8.6 with a 34% f/t ratio on 12/04/19 which is in his usual range.  He had a prostate MRI in the fall of 2017 that was negative but his prostate was very large. He was given a script for finasteride but he never started it.  His IPSS is 17 which is stable.  He has an episode of enuresis recently but that hasn't recurred.  He has no associated signs or symptoms.  Cr is 1.27 on 09/13/20.   Jason Alvarez has had occasional recurrent balanitis but it has responded to nystatin and hasn't had to use it recently.    IPSS    Row Name 11/04/20 1000         International Prostate Symptom Score   How often have you had the sensation of not emptying your bladder? Not at All     How often have you had to urinate less than every two hours? Less than 1 in 5 times     How often have you found you stopped and started again several times when you urinated? Less than 1 in 5 times     How often have you found it difficult to postpone urination? More than half the time     How often have you had a weak urinary stream? Almost always     How often have you had to strain to start urination? About half the time     How many times did you typically get up at night to urinate? 3 Times     Total IPSS Score 17           Quality of Life due to urinary symptoms   If you were to spend the rest of your life with your urinary condition just the way it is now how would you feel about that? Unhappy           ROS:  ROS:  A complete review of systems was performed.  All systems are negative except for pertinent findings as noted.   Review of Systems  All other systems reviewed and are negative.   Allergies   Allergen Reactions  . Penicillins Other (See Comments)    Childhood allergy Has patient had a PCN reaction causing immediate rash, facial/tongue/throat swelling, SOB or lightheadedness with hypotension: Unknown Has patient had a PCN reaction causing severe rash involving mucus membranes or skin necrosis: Unknown Has patient had a PCN reaction that required hospitalization: Unknown Has patient had a PCN reaction occurring within the last 10 years: Unknown If all of the above answers are "NO", then may proceed with Cephalosporin use.    Outpatient Encounter Medications as of 11/04/2020  Medication Sig  . ALPRAZolam (XANAX) 1 MG tablet Taking 1 tablet by mouth 4 times daily  . amLODipine (NORVASC) 10 MG tablet Take 1 tablet (10 mg total) by mouth daily.  . flecainide (TAMBOCOR) 100 MG tablet TAKE ONE TABLET BY MOUTH TWICE DAILY  . FLUAD 0.5 ML SUSY Inject as directed See admin instructions.  Marland Kitchen glipiZIDE-metformin (METAGLIP) 5-500 MG per tablet Take 1 tablet by mouth  2 (two) times daily after a meal.  . hydrochlorothiazide (HYDRODIURIL) 25 MG tablet Take 25 mg by mouth daily.  Marland Kitchen HYDROcodone-acetaminophen (NORCO) 10-325 MG tablet Take by mouth.  . hydrocortisone (ANUSOL-HC) 25 MG suppository Place 25 mg rectally 2 (two) times daily.  . metoprolol succinate (TOPROL-XL) 50 MG 24 hr tablet TAKE ONE (1) TABLET EACH DAY  . OneTouch Delica Lancets 18E MISC   . ONETOUCH VERIO test strip 1 each 2 (two) times daily.  . ramipril (ALTACE) 10 MG capsule TAKE ONE CAPSULE BY MOUTH TWICE A DAY  . tamsulosin (FLOMAX) 0.4 MG CAPS capsule Take 1 capsule (0.4 mg total) by mouth daily.  Marland Kitchen warfarin (COUMADIN) 5 MG tablet TAKE 1 TO 1&1/2 TABLET DAILY AS DIRECTED  . [DISCONTINUED] finasteride (PROSCAR) 5 MG tablet Take 1 tablet (5 mg total) by mouth daily.   No facility-administered encounter medications on file as of 11/04/2020.    Past Medical History:  Diagnosis Date  . A-fib ([place])    paf with NSVT on MCOT,  on coumadin, echo 01/31/08-nl lv, mild aortic valve sclerosis   . Aneurysm ([place])   . Anxiety   . Atrial fibrillation ([place])   . Carotid bruit    doppler 05/17/12-mild plaque with no diameter reduction  . Chronic anticoagulation   . Chronic ITP (idiopathic thrombocytopenia) ([facility]) 04/10/2012   Low grade thrombocytopenia consistent with chronic ITP not in need of therapy.   . DM (diabetes mellitus) ([place])   . Dysrhythmia    afib  . H/O cardiovascular stress test 01/31/08   no ishcemia, ef 61%  . History of kidney stones   . HTN (hypertension)   . Hypertension   . ITP (idiopathic thrombocytopenic purpura)   . Thrombocytopenia ([place])     Past Surgical History:  Procedure Laterality Date  . CIRCUMCISION    . COLONOSCOPY  06/25/2012   Procedure: COLONOSCOPY;  Surgeon: Jamesetta So, MD;  [facility];  Service: Gastroenterology;  Laterality: N/A;  . COLONOSCOPY N/A 01/30/2017   Procedure: COLONOSCOPY;  Surgeon: Aviva Signs, MD;  [facility];  Service: Gastroenterology;  Laterality: N/A;  . KNEE ARTHROSCOPY     right and left knee/1992 and 2000  . POLYPECTOMY  01/30/2017   Procedure: POLYPECTOMY;  Surgeon: Aviva Signs, MD;  [facility];  Service: Gastroenterology;;  colon  . VASECTOMY      Social History   Socioeconomic History  . Marital status: Married    Spouse name: Not on file  . Number of children: Not on file  . Years of education: Not on file  . Highest education level: Not on file  Occupational History  . Not on file  Tobacco Use  . Smoking status: Never Smoker  . Smokeless tobacco: Never Used  Vaping Use  . Vaping Use: Never used  Substance and Sexual Activity  . Alcohol use: No  . Drug use: No  . Sexual activity: Not on file  Other Topics Concern  . Not on file  Social History Narrative  . Not on file   Social Determinants of Health   Financial Resource Strain: Not on file  Food Insecurity: Not on file  Transportation  Needs: Not on file  Physical Activity: Not on file  Stress: Not on file  Social Connections: Not on file  Intimate Partner Violence: Not on file    Family History  Problem Relation Age of Onset  . Prostate cancer Father   . Cancer Sister  breast  . Cancer Brother        lung  . Melanoma Mother        Objective: Vitals:   11/04/20 1044  BP: (!) 157/70  Pulse: 65  Temp: 97.7 F (36.5 C)     Physical Exam  Lab Results:  No results found for this or any previous visit (from the past 24 hour(s)).  BMET No results for input(s): NA, K, CL, CO2, GLUCOSE, BUN, CREATININE, CALCIUM in the last 72 hours. PSA Lab Results  Component Value Date   PSA1 7.5 (H) 06/09/2020    PSA  Date Value Ref Range Status  12/04/2019 8.6  Final  11/26/2018 6.1  Final   No results found for: TESTOSTERONE  UA has 3-10 RBC's.  Results for orders placed or performed in visit on 11/04/20 (from the past 24 hour(s))  Urinalysis, Routine w reflex microscopic     Status: Abnormal   Collection Time: 11/04/20 10:45 AM  Result Value Ref Range   Specific Gravity, UA 1.015 1.005 - 1.030   pH, UA 5.5 5.0 - 7.5   Color, UA Amber (A) Yellow   Appearance Ur Clear Clear   Leukocytes,UA Negative Negative   Protein,UA Trace (A) Negative/Trace   Glucose, UA 3+ (A) Negative   Ketones, UA Negative Negative   RBC, UA 1+ (A) Negative   Bilirubin, UA Negative Negative   Urobilinogen, Ur 0.2 0.2 - 1.0 mg/dL   Nitrite, UA Negative Negative   Microscopic Examination See below:    Narrative   Performed at:  [facility] [address] [place], [place]  [id number] Lab Director: Mina Marble MT, Phone:  [phone]  Microscopic Examination     Status: Abnormal   Collection Time: 11/04/20 10:45 AM   Urine  Result Value Ref Range   WBC, UA None seen 0 - 5 /hpf   RBC 3-10 (A) 0 - 2 /hpf   Epithelial Cells (non renal) None seen 0 - 10 /hpf   Renal Epithel, UA None seen None seen /hpf    Mucus, UA Present Not Estab.   Bacteria, UA None seen None seen/Few   Narrative   Performed at:  [facility] [address] [place], [place]  [id number] Lab Director: [place], Phone:  [phone]    Studies/Results: No results found.    Assessment & Plan: BPH with BOO.  He has  Stable LUTS and a stable PSA.Marland Kitchen   He never took the finasteride.   We discussed other options and will try him on tamsulosin.  Balanitis has resolved with nystatin    Microhematuria.  I am going to have his set up for a CT and will have him return for cystoscopy.   Meds ordered this encounter  Medications  . tamsulosin (FLOMAX) 0.4 MG CAPS capsule    Sig: Take 1 capsule (0.4 mg total) by mouth daily.    Dispense:  30 capsule    Refill:  11     Orders Placed This Encounter  Procedures  . Urinalysis, Routine w reflex microscopic      Return in about 4 weeks (around 12/02/2020) for with CT results for possible cystoscopy. .   CC: Redmond School, MD      Irine Seal 11/04/2020 Patient ID: Jason Alvarez, male   DOB: [birth year], 80 y.o.   MRN: [id number]

## 2020-11-08 ENCOUNTER — Telehealth: Payer: Self-pay | Admitting: Internal Medicine

## 2020-11-08 ENCOUNTER — Telehealth: Payer: Self-pay

## 2020-11-08 NOTE — Telephone Encounter (Signed)
Already addressed by Dr. Abbey Chatters

## 2020-11-08 NOTE — Telephone Encounter (Signed)
[place] PATIENT ABOUT HIS DIARRHEA.  HE WOULD LIKE SOMETHING CALLED IN PRIOR TO HIS TCS

## 2020-11-08 NOTE — Telephone Encounter (Signed)
Pt complains of having diarrhea since early this morning. He has had about 4 or 5 bouts of diarrhea. (no pain, fever) very loose to where he is having accidents on himself. States he has some left over Diphenoxylate-Atropine 2.5mg  from where his PCP has prescribed them last year.pt also wants something sent in before his procedure next month.

## 2020-11-08 NOTE — Telephone Encounter (Signed)
Recommend Cdiff and GI path panel if he has persistent diarrhea. This could be self-limiting and resolve with supportive measures. I would not send in anti-diarrheal until knowing what we are dealing with. Further rec's per Dr. Abbey Chatters who last saw patient.

## 2020-11-08 NOTE — Telephone Encounter (Signed)
Per Dr. Abbey Chatters pt can take Immodium OTC to see if it will work. Pt will call us back if it doesn't work and Dr. Abbey Chatters will phone in something else for him. Dr. Abbey Chatters to address something for procedure coming up

## 2020-11-17 ENCOUNTER — Encounter (HOSPITAL_COMMUNITY): Payer: Self-pay | Admitting: Nurse Practitioner

## 2020-11-17 ENCOUNTER — Other Ambulatory Visit: Payer: Self-pay

## 2020-11-17 ENCOUNTER — Ambulatory Visit (HOSPITAL_COMMUNITY)
Admission: RE | Admit: 2020-11-17 | Discharge: 2020-11-17 | Disposition: A | Discharge status: Home / Self Care | Payer: Medicare HMO | Source: Ambulatory Visit | Attending: Nurse Practitioner | Admitting: Nurse Practitioner

## 2020-11-17 VITALS — BP 142/50 | HR 55 | Ht 74.0 in | Wt 214.0 lb

## 2020-11-17 DIAGNOSIS — I480 Paroxysmal atrial fibrillation: Secondary | ICD-10-CM | POA: Insufficient documentation

## 2020-11-17 DIAGNOSIS — I4891 Unspecified atrial fibrillation: Secondary | ICD-10-CM | POA: Diagnosis not present

## 2020-11-17 DIAGNOSIS — D6869 Other thrombophilia: Secondary | ICD-10-CM

## 2020-11-17 DIAGNOSIS — Z7901 Long term (current) use of anticoagulants: Secondary | ICD-10-CM | POA: Insufficient documentation

## 2020-11-17 DIAGNOSIS — I10 Essential (primary) hypertension: Secondary | ICD-10-CM | POA: Diagnosis not present

## 2020-11-17 DIAGNOSIS — Z79899 Other long term (current) drug therapy: Secondary | ICD-10-CM | POA: Insufficient documentation

## 2020-11-17 MED ORDER — FLECAINIDE ACETATE 100 MG PO TABS
50.0000 mg | ORAL_TABLET | Freq: Two times a day (BID) | ORAL | 2 refills | Status: DC
Start: 2020-11-17 — End: 2021-04-07

## 2020-11-17 NOTE — Progress Notes (Signed)
Patient ID: Jason Alvarez, male   DOB: 09-08-1940, 80 y.o.   MRN: [id number]     Primary Care Physician: Redmond School, MD Referring Physician: Dr. Charlyne Petrin is a 80 y.o. male with a h/o PAF on flecainide. He reports that he had a mechanical fall one month ago  but did not suffer any significant injury, SInce then he feels that he may have had more fluttering but it is very difficult for him to  describe.Marland Kitchen   His blood pressure is also elevated but on recheck 140 /50. He shows me his BP readings many of which are showing systolics of 272-536. He was on this dose in the past but he lowered as he felt BP readings were getting too low. He tells me that he was started on a baby asa a few months ago by his PCP, he did not know why. I see in Care everywhere that he was seen in October for dizziness by Neurology at [facility] and was found to have vestibular stenosis so may be 2/2 to this. He feels he is bruising more with also being on warfarin, CHA2DS2VASc score of at least 4.  He also admits that his blood glucose not too long ago got up to 15. He made some dietary changes and brought  It down to 7 but has slipped again in his dietary control.   F/u in afib clinic, 11/17/20. He reports low afib burden. His 1st degree AV block has progressed to 272 ms so will reduce flecainide to 50 mg bid. Otherwise, no complaints today.   Today,  , negative for  chest pain, shortness of breath, orthopnea, PND, lower extremity edema, dizziness, presyncope, syncope, or neurologic sequela. The patient is tolerating medications without difficulties and is otherwise without complaint today.   Past Medical History:  Diagnosis Date  . A-fib ([place])    paf with NSVT on MCOT, on coumadin, echo 01/31/08-nl lv, mild aortic valve sclerosis   . Aneurysm ([place])   . Anxiety   . Atrial fibrillation ([place])   . Carotid bruit    doppler 05/17/12-mild plaque with no diameter reduction  . Chronic anticoagulation   . Chronic ITP  (idiopathic thrombocytopenia) (HCC) 04/10/2012   Low grade thrombocytopenia consistent with chronic ITP not in need of therapy.   . DM (diabetes mellitus) ([place])   . Dysrhythmia    afib  . H/O cardiovascular stress test 01/31/08   no ishcemia, ef 61%  . History of kidney stones   . HTN (hypertension)   . Hypertension   . ITP (idiopathic thrombocytopenic purpura)   . Thrombocytopenia ([place])    Past Surgical History:  Procedure Laterality Date  . CIRCUMCISION    . COLONOSCOPY  06/25/2012   Procedure: COLONOSCOPY;  Surgeon: Jamesetta So, MD;  [facility];  Service: Gastroenterology;  Laterality: N/A;  . COLONOSCOPY N/A 01/30/2017   Procedure: COLONOSCOPY;  Surgeon: Aviva Signs, MD;  Location[facility];  Service: Gastroenterology;  Laterality: N/A;  . KNEE ARTHROSCOPY     right and left knee/1992 and 2000  . POLYPECTOMY  01/30/2017   Procedure: POLYPECTOMY;  Surgeon: Aviva Signs, MD;  [facility];  Service: Gastroenterology;;  colon  . VASECTOMY      Current Outpatient Medications  Medication Sig Dispense Refill  . ALPRAZolam (XANAX) 1 MG tablet Taking 1 tablet by mouth 4 times daily    . amLODipine (NORVASC) 10 MG tablet Take 1 tablet (10  mg total) by mouth daily. 30 tablet 6  . FLUAD 0.5 ML SUSY Inject as directed See admin instructions.    Marland Kitchen glipiZIDE-metformin (METAGLIP) 5-500 MG per tablet Take 1 tablet by mouth 2 (two) times daily after a meal.    . hydrochlorothiazide (HYDRODIURIL) 25 MG tablet Take 25 mg by mouth daily.    Marland Kitchen HYDROcodone-acetaminophen (NORCO) 10-325 MG tablet Take by mouth.    . metoprolol succinate (TOPROL-XL) 50 MG 24 hr tablet TAKE ONE (1) TABLET EACH DAY 90 tablet 2  . OneTouch Delica Lancets 30Q MISC     . ONETOUCH VERIO test strip 1 each 2 (two) times daily.    . ramipril (ALTACE) 10 MG capsule TAKE ONE CAPSULE BY MOUTH TWICE A DAY 180 capsule 2  . tamsulosin (FLOMAX) 0.4 MG CAPS capsule Take 1 capsule (0.4 mg total) by  mouth daily. 30 capsule 11  . warfarin (COUMADIN) 5 MG tablet TAKE 1 TO 1&1/2 TABLET DAILY AS DIRECTED 120 tablet 2  . flecainide (TAMBOCOR) 100 MG tablet Take 0.5 tablets (50 mg total) by mouth 2 (two) times daily. 180 tablet 2   No current facility-administered medications for this encounter.    Allergies  Allergen Reactions  . Penicillins Other (See Comments)    Childhood allergy Has patient had a PCN reaction causing immediate rash, facial/tongue/throat swelling, SOB or lightheadedness with hypotension: Unknown Has patient had a PCN reaction causing severe rash involving mucus membranes or skin necrosis: Unknown Has patient had a PCN reaction that required hospitalization: Unknown Has patient had a PCN reaction occurring within the last 10 years: Unknown If all of the above answers are "NO", then may proceed with Cephalosporin use.    Social History   Socioeconomic History  . Marital status: Married    Spouse name: Not on file  . Number of children: Not on file  . Years of education: Not on file  . Highest education level: Not on file  Occupational History  . Not on file  Tobacco Use  . Smoking status: Never Smoker  . Smokeless tobacco: Never Used  Vaping Use  . Vaping Use: Never used  Substance and Sexual Activity  . Alcohol use: No  . Drug use: No  . Sexual activity: Not on file  Other Topics Concern  . Not on file  Social History Narrative  . Not on file   Social Determinants of Health   Financial Resource Strain: Not on file  Food Insecurity: Not on file  Transportation Needs: Not on file  Physical Activity: Not on file  Stress: Not on file  Social Connections: Not on file  Intimate Partner Violence: Not on file    Family History  Problem Relation Age of Onset  . Prostate cancer Father   . Cancer Sister        breast  . Cancer Brother        lung  . Melanoma Mother     ROS- All systems are reviewed and negative except as per the HPI  above  Physical Exam: Vitals:   11/17/20 0840  BP: (!) 142/50  Pulse: (!) 55  Weight: 97.1 kg  Height: 6\' 2"  (1.88 m)    GEN- The patient is well appearing, alert and oriented x 3 today.   Head- normocephalic, atraumatic Eyes-  Sclera clear, conjunctiva pink Ears- hearing intact Oropharynx- clear Neck- supple, no JVP Lymph- no cervical lymphadenopathy Lungs- Clear to ausculation bilaterally, normal work of breathing Heart- Regular rate and rhythm,  no murmurs, rubs or gallops, PMI not laterally displaced Extremities- no clubbing, cyanosis, or edema MS- no significant deformity or atrophy Skin- no rash or lesion Psych- euthymic mood, full affect Neuro- strength and sensation are intact  EKG- Sinus brady at 55 bpm, pr int  272 ms,  qrs int 122 ms, qtc 457 Epic records reviewed  Assessment and Plan: 1. Paroxysmal atrial fibrillation Low burden  Decrease   Flecainide 100 mg bid to 50 mg bid for progression of first degree AV block Continue metoprolol 50 mg daily moniotr placed on last visit here did not show any afib byt some disorganized atrial activity and atrial tach    2.HTN Stable   3. Chadsvasc score of at least 4 Continue warfarin, followed by [facility] office    F/u 2 weeks for EKG and 6 month f/u    Butch Penny C. Jermia Rigsby, [facility] [address] [place], [place] [address] [phone]

## 2020-11-17 NOTE — Patient Instructions (Signed)
Decrease flecainide to 50mg twice a day 

## 2020-11-23 ENCOUNTER — Ambulatory Visit (HOSPITAL_COMMUNITY)
Admission: RE | Admit: 2020-11-23 | Discharge: 2020-11-23 | Disposition: A | Discharge status: Home / Self Care | Payer: Medicare HMO | Source: Ambulatory Visit | Attending: Nurse Practitioner | Admitting: Nurse Practitioner

## 2020-11-23 ENCOUNTER — Encounter (HOSPITAL_COMMUNITY): Payer: Self-pay | Admitting: Nurse Practitioner

## 2020-11-23 ENCOUNTER — Other Ambulatory Visit: Payer: Self-pay

## 2020-11-23 VITALS — BP 132/56 | HR 53 | Ht 74.0 in | Wt 216.6 lb

## 2020-11-23 DIAGNOSIS — I10 Essential (primary) hypertension: Secondary | ICD-10-CM | POA: Diagnosis not present

## 2020-11-23 DIAGNOSIS — Z7901 Long term (current) use of anticoagulants: Secondary | ICD-10-CM | POA: Diagnosis not present

## 2020-11-23 DIAGNOSIS — H8193 Unspecified disorder of vestibular function, bilateral: Secondary | ICD-10-CM | POA: Diagnosis not present

## 2020-11-23 DIAGNOSIS — Z880 Allergy status to penicillin: Secondary | ICD-10-CM | POA: Insufficient documentation

## 2020-11-23 DIAGNOSIS — I440 Atrioventricular block, first degree: Secondary | ICD-10-CM | POA: Diagnosis not present

## 2020-11-23 DIAGNOSIS — I4891 Unspecified atrial fibrillation: Secondary | ICD-10-CM

## 2020-11-23 DIAGNOSIS — I480 Paroxysmal atrial fibrillation: Secondary | ICD-10-CM | POA: Diagnosis not present

## 2020-11-23 DIAGNOSIS — D6869 Other thrombophilia: Secondary | ICD-10-CM | POA: Diagnosis not present

## 2020-11-23 DIAGNOSIS — Z79899 Other long term (current) drug therapy: Secondary | ICD-10-CM | POA: Diagnosis not present

## 2020-11-23 DIAGNOSIS — Z7984 Long term (current) use of oral hypoglycemic drugs: Secondary | ICD-10-CM | POA: Insufficient documentation

## 2020-11-23 NOTE — Progress Notes (Signed)
Patient ID: Jason Alvarez, male   DOB: [birth year], 80 y.o.   MRN: [id number]     Primary Care Physician: Redmond School, MD Referring Physician: Dr. Charlyne Petrin is a 80 y.o. male with a h/o PAF on flecainide. He reports that he had a mechanical fall one month ago  but did not suffer any significant injury, SInce then he feels that he may have had more fluttering but it is very difficult for him to  describe.Marland Kitchen   His blood pressure is also elevated but on recheck 140 /50. He shows me his BP readings many of which are showing systolics of 644-034. He was on this dose in the past but he lowered as he felt BP readings were getting too low. He tells me that he was started on a baby asa a few months ago by his PCP, he did not know why. I see in Care everywhere that he was seen in October for dizziness by Neurology at [facility] and was found to have vestibular stenosis so may be 2/2 to this. He feels he is bruising more with also being on warfarin, CHA2DS2VASc score of at least 4.  He also admits that his blood glucose not too long ago got up to 15. He made some dietary changes and brought  It down to 7 but has slipped again in his dietary control.   F/u in afib clinic, 11/17/20. He reports low afib burden. His 1st degree AV block has progressed to 272 ms so will reduce flecainide to 50 mg bid. Otherwise, no complaints today.   F/u in afib clinic, 11/23/20. He wanted to be seen as he noted some increase in fluttering for a few days after reducing flecainide dose but nothing sustained. He has not noted any afib for the last 2 days. His PR has reduced in interval length to 244 ms from 272 ms. We discussed options but for now he wants to continue with flecainide at 50 mg bid.   Today,  , negative for  chest pain, shortness of breath, orthopnea, PND, lower extremity edema, dizziness, presyncope, syncope, or neurologic sequela. The patient is tolerating medications without difficulties and is otherwise  without complaint today.   Past Medical History:  Diagnosis Date  . A-fib ([place])    paf with NSVT on MCOT, on coumadin, echo 01/31/08-nl lv, mild aortic valve sclerosis   . Aneurysm ([place])   . Anxiety   . Atrial fibrillation ([place])   . Carotid bruit    doppler 05/17/12-mild plaque with no diameter reduction  . Chronic anticoagulation   . Chronic ITP (idiopathic thrombocytopenia) (HCC) 04/10/2012   Low grade thrombocytopenia consistent with chronic ITP not in need of therapy.   . DM (diabetes mellitus) ([place])   . Dysrhythmia    afib  . H/O cardiovascular stress test 01/31/08   no ishcemia, ef 61%  . History of kidney stones   . HTN (hypertension)   . Hypertension   . ITP (idiopathic thrombocytopenic purpura)   . Thrombocytopenia ([place])    Past Surgical History:  Procedure Laterality Date  . CIRCUMCISION    . COLONOSCOPY  06/25/2012   Procedure: COLONOSCOPY;  Surgeon: Jamesetta So, MD;  [facility];  Service: Gastroenterology;  Laterality: N/A;  . COLONOSCOPY N/A 01/30/2017   Procedure: COLONOSCOPY;  Surgeon: Aviva Signs, MD;  [facility];  Service: Gastroenterology;  Laterality: N/A;  . KNEE ARTHROSCOPY     right and left knee/1992  and 2000  . POLYPECTOMY  01/30/2017   Procedure: POLYPECTOMY;  Surgeon: Aviva Signs, MD;  [facility];  Service: Gastroenterology;;  colon  . VASECTOMY      Current Outpatient Medications  Medication Sig Dispense Refill  . ALPRAZolam (XANAX) 1 MG tablet Taking 1 tablet by mouth 4 times daily    . amLODipine (NORVASC) 10 MG tablet Take 1 tablet (10 mg total) by mouth daily. 30 tablet 6  . flecainide (TAMBOCOR) 100 MG tablet Take 0.5 tablets (50 mg total) by mouth 2 (two) times daily. 180 tablet 2  . FLUAD 0.5 ML SUSY Inject as directed See admin instructions.    Marland Kitchen glipiZIDE-metformin (METAGLIP) 5-500 MG per tablet Take 1 tablet by mouth 2 (two) times daily after a meal.    . hydrochlorothiazide (HYDRODIURIL) 25 MG  tablet Take 25 mg by mouth daily.    Marland Kitchen HYDROcodone-acetaminophen (NORCO) 10-325 MG tablet Take by mouth.    . metoprolol succinate (TOPROL-XL) 50 MG 24 hr tablet TAKE ONE (1) TABLET EACH DAY 90 tablet 2  . OneTouch Delica Lancets 16X MISC     . ONETOUCH VERIO test strip 1 each 2 (two) times daily.    . ramipril (ALTACE) 10 MG capsule TAKE ONE CAPSULE BY MOUTH TWICE A DAY 180 capsule 2  . tamsulosin (FLOMAX) 0.4 MG CAPS capsule Take 1 capsule (0.4 mg total) by mouth daily. 30 capsule 11  . warfarin (COUMADIN) 5 MG tablet TAKE 1 TO 1&1/2 TABLET DAILY AS DIRECTED 120 tablet 2   No current facility-administered medications for this encounter.    Allergies  Allergen Reactions  . Penicillins Other (See Comments)    Childhood allergy Has patient had a PCN reaction causing immediate rash, facial/tongue/throat swelling, SOB or lightheadedness with hypotension: Unknown Has patient had a PCN reaction causing severe rash involving mucus membranes or skin necrosis: Unknown Has patient had a PCN reaction that required hospitalization: Unknown Has patient had a PCN reaction occurring within the last 10 years: Unknown If all of the above answers are "NO", then may proceed with Cephalosporin use.    Social History   Socioeconomic History  . Marital status: Married    Spouse name: Not on file  . Number of children: Not on file  . Years of education: Not on file  . Highest education level: Not on file  Occupational History  . Not on file  Tobacco Use  . Smoking status: Never Smoker  . Smokeless tobacco: Never Used  Vaping Use  . Vaping Use: Never used  Substance and Sexual Activity  . Alcohol use: No  . Drug use: No  . Sexual activity: Not on file  Other Topics Concern  . Not on file  Social History Narrative  . Not on file   Social Determinants of Health   Financial Resource Strain: Not on file  Food Insecurity: Not on file  Transportation Needs: Not on file  Physical Activity: Not  on file  Stress: Not on file  Social Connections: Not on file  Intimate Partner Violence: Not on file    Family History  Problem Relation Age of Onset  . Prostate cancer Father   . Cancer Sister        breast  . Cancer Brother        lung  . Melanoma Mother     ROS- All systems are reviewed and negative except as per the HPI above  Physical Exam: Vitals:   11/23/20 0856  BP: (!) 132/56  Pulse: (!) 53  Weight: 98.2 kg  Height: 6\' 2"  (1.88 m)    GEN- The patient is well appearing, alert and oriented x 3 today.   Head- normocephalic, atraumatic Eyes-  Sclera clear, conjunctiva pink Ears- hearing intact Oropharynx- clear Neck- supple, no JVP Lymph- no cervical lymphadenopathy Lungs- Clear to ausculation bilaterally, normal work of breathing Heart- Regular rate and rhythm, no murmurs, rubs or gallops, PMI not laterally displaced Extremities- no clubbing, cyanosis, or edema MS- no significant deformity or atrophy Skin- no rash or lesion Psych- euthymic mood, full affect Neuro- strength and sensation are intact  EKG- Sinus brady at 55 bpm, pr int  272 ms,  qrs int 122 ms, qtc 457 Epic records reviewed  Assessment and Plan: 1. Paroxysmal atrial fibrillation  Decreased   Flecainide 100 mg bid to 50 mg bid for progression of first degree AV block PR has now shortened on lowered dose but pt did notice sme flutting for a few days of reducing dose Ha not noted any further fluttering for the last 2 days We discussed options such as amiodarone, Tikosyn vrs ablation For now he will continue the course  Continue metoprolol 50 mg daily, cannot increase for bradycardia at baseline   2.HTN Stable   3. Chadsvasc score of at least 4 Continue warfarin, followed by [facility] office    F/u in 6 months, sooner if afib burden increases   Butch Penny C. Amy Gothard, [facility] [address] [place], [place] [address] [phone]

## 2020-11-30 ENCOUNTER — Ambulatory Visit (INDEPENDENT_AMBULATORY_CARE_PROVIDER_SITE_OTHER): Payer: Medicare HMO | Admitting: *Deleted

## 2020-11-30 ENCOUNTER — Telehealth: Payer: Self-pay | Admitting: *Deleted

## 2020-11-30 DIAGNOSIS — Z5181 Encounter for therapeutic drug level monitoring: Secondary | ICD-10-CM

## 2020-11-30 DIAGNOSIS — I4891 Unspecified atrial fibrillation: Secondary | ICD-10-CM

## 2020-11-30 LAB — POCT INR: INR: 2.7 (ref 2.0–3.0)

## 2020-11-30 NOTE — Patient Instructions (Signed)
Continue warfarin 1 tablet daily  Continue greens/salad    Repeat INR in 4 weeks.  Pending flex sig on 5/31.  Has been cleared to hold warfarin 5 days before procedure.  Do not need Lovenox Bridge.  Pt has instructions at home.

## 2020-11-30 NOTE — Telephone Encounter (Signed)
Received VM from pt. caleld back. He is canceling procedure on 5/31. He is not having any issues right now. He states he will call back if he does to r/s. Message sent to endo

## 2020-12-01 ENCOUNTER — Telehealth: Payer: Self-pay | Admitting: *Deleted

## 2020-12-01 ENCOUNTER — Encounter (HOSPITAL_COMMUNITY): Payer: Medicare HMO | Admitting: Nurse Practitioner

## 2020-12-01 NOTE — Telephone Encounter (Signed)
Patient called requesting an appointment for worsening bilateral leg/calf pain with burning and weakness.  He rates the pain at #7. Patient was willing to go to [place] office to follow up with Dr Donnetta Hutching, however an appointment was not available for four weeks. Appointment scheduled 12/08/2020 at 8:00 for ABI see PA.

## 2020-12-02 ENCOUNTER — Other Ambulatory Visit: Payer: Self-pay

## 2020-12-02 DIAGNOSIS — I739 Peripheral vascular disease, unspecified: Secondary | ICD-10-CM

## 2020-12-08 ENCOUNTER — Ambulatory Visit (HOSPITAL_COMMUNITY): Payer: Medicare HMO

## 2020-12-08 ENCOUNTER — Ambulatory Visit: Payer: Medicare HMO

## 2020-12-10 ENCOUNTER — Other Ambulatory Visit (HOSPITAL_COMMUNITY): Payer: Medicare HMO

## 2020-12-14 ENCOUNTER — Encounter (HOSPITAL_COMMUNITY): Payer: Self-pay

## 2020-12-14 ENCOUNTER — Ambulatory Visit (HOSPITAL_COMMUNITY): Admit: 2020-12-14 | Payer: Medicare HMO

## 2020-12-14 SURGERY — SIGMOIDOSCOPY, FLEXIBLE
Anesthesia: Monitor Anesthesia Care

## 2020-12-27 ENCOUNTER — Ambulatory Visit (INDEPENDENT_AMBULATORY_CARE_PROVIDER_SITE_OTHER): Payer: Medicare HMO | Admitting: *Deleted

## 2020-12-27 DIAGNOSIS — Z5181 Encounter for therapeutic drug level monitoring: Secondary | ICD-10-CM | POA: Diagnosis not present

## 2020-12-27 DIAGNOSIS — I4891 Unspecified atrial fibrillation: Secondary | ICD-10-CM | POA: Diagnosis not present

## 2020-12-27 LAB — POCT INR: INR: 2.5 (ref 2.0–3.0)

## 2020-12-27 NOTE — Patient Instructions (Signed)
Continue warfarin 1 tablet daily  Continue greens/salad    Repeat INR in 5 weeks.

## 2020-12-30 ENCOUNTER — Ambulatory Visit (HOSPITAL_COMMUNITY)
Admission: RE | Admit: 2020-12-30 | Discharge: 2020-12-30 | Disposition: A | Discharge status: Home / Self Care | Payer: Medicare HMO | Source: Ambulatory Visit | Attending: Urology | Admitting: Urology

## 2020-12-30 DIAGNOSIS — R3129 Other microscopic hematuria: Secondary | ICD-10-CM | POA: Diagnosis not present

## 2020-12-30 LAB — POCT I-STAT CREATININE: Creatinine, Ser: 1.1 mg/dL (ref 0.61–1.24)

## 2020-12-30 MED ORDER — IOHEXOL 300 MG/ML  SOLN
100.0000 mL | Freq: Once | INTRAMUSCULAR | Status: AC | PRN
  Administered 2020-12-30: 100 mL via INTRAVENOUS

## 2020-12-30 MED ORDER — IOHEXOL 300 MG/ML  SOLN
25.0000 mL | Freq: Once | INTRAMUSCULAR | Status: AC | PRN
  Administered 2020-12-30: 25 mL via INTRAVENOUS

## 2020-12-31 NOTE — Progress Notes (Signed)
Sent via mychart and mail. 

## 2021-01-06 ENCOUNTER — Other Ambulatory Visit: Payer: Medicare HMO | Admitting: Urology

## 2021-01-31 ENCOUNTER — Ambulatory Visit (INDEPENDENT_AMBULATORY_CARE_PROVIDER_SITE_OTHER): Payer: Medicare HMO | Admitting: *Deleted

## 2021-01-31 ENCOUNTER — Other Ambulatory Visit: Payer: Self-pay

## 2021-01-31 DIAGNOSIS — I4891 Unspecified atrial fibrillation: Secondary | ICD-10-CM

## 2021-01-31 DIAGNOSIS — Z5181 Encounter for therapeutic drug level monitoring: Secondary | ICD-10-CM

## 2021-01-31 LAB — POCT INR: INR: 2.6 (ref 2.0–3.0)

## 2021-01-31 NOTE — Patient Instructions (Signed)
Continue warfarin 1 tablet daily  Continue greens/salad    Repeat INR in 6 weeks.  

## 2021-02-09 ENCOUNTER — Ambulatory Visit (INDEPENDENT_AMBULATORY_CARE_PROVIDER_SITE_OTHER): Payer: Medicare HMO

## 2021-02-09 ENCOUNTER — Encounter: Payer: Self-pay | Admitting: Vascular Surgery

## 2021-02-09 ENCOUNTER — Other Ambulatory Visit: Payer: Self-pay

## 2021-02-09 ENCOUNTER — Ambulatory Visit: Payer: Medicare HMO | Admitting: Vascular Surgery

## 2021-02-09 VITALS — BP 178/72 | HR 55 | Temp 97.5°F | Ht 75.0 in | Wt 217.4 lb

## 2021-02-09 DIAGNOSIS — I739 Peripheral vascular disease, unspecified: Secondary | ICD-10-CM

## 2021-02-09 DIAGNOSIS — M79606 Pain in leg, unspecified: Secondary | ICD-10-CM

## 2021-02-09 NOTE — Progress Notes (Signed)
[facility] [place]  Patient name: Jason Alvarez MRN: [id number] DOB: 07/24/1940 Sex: male  REASON FOR VISIT: Evaluation lower extremity discomfort  HPI: [name] is a 80 y.o. male here today for evaluation.  I had an office visit with him in March 2021 for similar discomfort.  He reports that he had weakness in his legs.  This is not particularly related to exercise.  He has had prior arthroscopy and both knees.  He reports that he is still felt to be "bone-on-bone".  He has no history of lower extremity tissue loss.  He does report some burning and tingling in his feet bilaterally.  He is on chronic Coumadin for chronic atrial fibrillation.  Does have a history of right saphenous vein ablation by Dr. Kellie Simmering years ago and reports good result from this.  He does occasionally have swelling in his right calf with long-term sitting or standing.  Past Medical History:  Diagnosis Date   A-fib ([place])    paf with NSVT on MCOT, on coumadin, echo 01/31/08-nl lv, mild aortic valve sclerosis    Aneurysm ([facility])    Anxiety    Atrial fibrillation (H[facility])    Carotid bruit    doppler 05/17/12-mild plaque with no diameter reduction   Chronic anticoagulation    Chronic ITP (idiopathic thrombocytopenia) (HCC) 04/10/2012   Low grade thrombocytopenia consistent with chronic ITP not in need of therapy.    DM (diabetes mellitus) ([place])    Dysrhythmia    afib   H/O cardiovascular stress test 01/31/08   no ishcemia, ef 61%   History of kidney stones    HTN (hypertension)    Hypertension    ITP (idiopathic thrombocytopenic purpura)    Thrombocytopenia (HCC)     Family History  Problem Relation Age of Onset   Prostate cancer Father    Cancer Sister        breast   Cancer Brother        lung   Melanoma Mother     SOCIAL HISTORY: Social History   Tobacco Use   Smoking status: Never   Smokeless tobacco: Never  Substance Use Topics    Alcohol use: No    Allergies  Allergen Reactions   Penicillins Other (See Comments)    Childhood allergy Has patient had a PCN reaction causing immediate rash, facial/tongue/throat swelling, SOB or lightheadedness with hypotension: Unknown Has patient had a PCN reaction causing severe rash involving mucus membranes or skin necrosis: Unknown Has patient had a PCN reaction that required hospitalization: Unknown Has patient had a PCN reaction occurring within the last 10 years: Unknown If all of the above answers are "NO", then may proceed with Cephalosporin use.    Current Outpatient Medications  Medication Sig Dispense Refill   ALPRAZolam (XANAX) 1 MG tablet Taking 1 tablet by mouth 4 times daily     amLODipine (NORVASC) 10 MG tablet Take 1 tablet (10 mg total) by mouth daily. 30 tablet 6   flecainide (TAMBOCOR) 100 MG tablet Take 0.5 tablets (50 mg total) by mouth 2 (two) times daily. 180 tablet 2   FLUAD 0.5 ML SUSY Inject as directed See admin instructions.     glipiZIDE-metformin (METAGLIP) 5-500 MG per tablet Take 1 tablet by mouth 2 (two) times daily after a meal.     hydrochlorothiazide (HYDRODIURIL) 25 MG tablet Take 25 mg by mouth daily.     HYDROcodone-acetaminophen (NORCO) 10-325 MG tablet Take by mouth.  metoprolol succinate (TOPROL-XL) 50 MG 24 hr tablet TAKE ONE (1) TABLET EACH DAY 90 tablet 2   OneTouch Delica Lancets 99991111 MISC      ONETOUCH VERIO test strip 1 each 2 (two) times daily.     ramipril (ALTACE) 10 MG capsule TAKE ONE CAPSULE BY MOUTH TWICE A DAY 180 capsule 2   tamsulosin (FLOMAX) 0.4 MG CAPS capsule Take 1 capsule (0.4 mg total) by mouth daily. 30 capsule 11   warfarin (COUMADIN) 5 MG tablet TAKE 1 TO 1&1/2 TABLET DAILY AS DIRECTED 120 tablet 2   No current facility-administered medications for this visit.    REVIEW OF SYSTEMS:  '[X]'$  denotes positive finding, '[ ]'$  denotes negative finding Cardiac  Comments:  Chest pain or chest pressure:    Shortness  of breath upon exertion:    Short of breath when lying flat:    Irregular heart rhythm: x       Vascular    Pain in calf, thigh, or hip brought on by ambulation:    Pain in feet at night that wakes you up from your sleep:     Blood clot in your veins:    Leg swelling:  x         PHYSICAL EXAM: Vitals:   02/09/21 0833  BP: (!) 178/72  Pulse: (!) 55  Temp: (!) 97.5 F (36.4 C)  TempSrc: Oral  SpO2: 100%  Weight: 217 lb 6.4 oz (98.6 kg)  Height: '6\' 3"'$  (1.905 m)    GENERAL: The patient is a well-nourished male, in no acute distress. The vital signs are documented above. CARDIOVASCULAR: 2+ radial and 2+ dorsalis pedis pulses bilaterally PULMONARY: There is good air exchange  MUSCULOSKELETAL: There are no major deformities or cyanosis. NEUROLOGIC: No focal weakness or paresthesias are detected. SKIN: There are no ulcers or rashes noted. PSYCHIATRIC: The patient has a normal affect.  DATA:  Noninvasive studies today were reviewed with the patient.  This reveals noncompressible vessels therefore ankle arm index is not reliable.  He does have normal toe brachial index.  He has triphasic waveforms at the posterior tibial and dorsalis pedis bilaterally  MEDICAL ISSUES: No evidence of lower extremity occlusive disease.  Does have some calcification of his vessels making ankle arm index unreliable.  I do not feel that he is having any symptoms referable to arterial insufficiency.  He was reassured with this discussion.  I did explain potential use for compression if he has more swelling in his right leg.  He will see [place] again on an as-needed basis    Rosetta Posner, MD FACS [facility] Tel [phone]  Note: Portions of this report may have been transcribed using voice recognition software.  Every effort has been made to ensure accuracy; however, inadvertent computerized transcription errors may still be present.

## 2021-02-23 ENCOUNTER — Telehealth (HOSPITAL_COMMUNITY): Payer: Self-pay | Admitting: *Deleted

## 2021-02-23 ENCOUNTER — Ambulatory Visit (HOSPITAL_COMMUNITY)
Admission: RE | Admit: 2021-02-23 | Discharge: 2021-02-23 | Disposition: A | Discharge status: Home / Self Care | Payer: Medicare HMO | Source: Ambulatory Visit | Attending: Physician Assistant | Admitting: Physician Assistant

## 2021-02-23 DIAGNOSIS — I480 Paroxysmal atrial fibrillation: Secondary | ICD-10-CM

## 2021-02-24 NOTE — Telephone Encounter (Signed)
Pt called in stating he is having "skipping" fluttering hard for him to explain what he is feeling. He states it continues to worsen since decreasing flecainide but pt is not sure if it is afib or not. His HR is ranging from 44-56. Will order heart monitor 7 days to better define his symptoms.

## 2021-03-11 ENCOUNTER — Telehealth (HOSPITAL_COMMUNITY): Payer: Self-pay | Admitting: *Deleted

## 2021-03-11 NOTE — Telephone Encounter (Signed)
-----   Message from Sherran Needs, NP sent at 03/10/2021  9:17 AM EDT ----- Please inform pt that he does have intermittent afib at 8% burden which was detected by pt correctly. Let him know results of the monitor and if he wants to change antiarrythmic he can come into the office to discuss.

## 2021-03-11 NOTE — Telephone Encounter (Signed)
Pt notified of results. At this time "he doesn't like the breakthrough afib but doesn't want to change any of his medications."   Pt will call if burden increases to the point it is bothersome to him.

## 2021-03-14 ENCOUNTER — Other Ambulatory Visit: Payer: Self-pay

## 2021-03-14 ENCOUNTER — Ambulatory Visit (INDEPENDENT_AMBULATORY_CARE_PROVIDER_SITE_OTHER): Payer: Medicare HMO | Admitting: *Deleted

## 2021-03-14 DIAGNOSIS — Z5181 Encounter for therapeutic drug level monitoring: Secondary | ICD-10-CM | POA: Diagnosis not present

## 2021-03-14 DIAGNOSIS — I4891 Unspecified atrial fibrillation: Secondary | ICD-10-CM | POA: Diagnosis not present

## 2021-03-14 LAB — POCT INR: INR: 2.8 (ref 2.0–3.0)

## 2021-03-14 NOTE — Patient Instructions (Signed)
Continue warfarin 1 tablet daily  Continue greens/salad    Repeat INR in 6 weeks.  

## 2021-03-17 ENCOUNTER — Encounter: Payer: Self-pay | Admitting: Urology

## 2021-03-17 ENCOUNTER — Ambulatory Visit (INDEPENDENT_AMBULATORY_CARE_PROVIDER_SITE_OTHER): Payer: Medicare HMO | Admitting: Urology

## 2021-03-17 ENCOUNTER — Other Ambulatory Visit: Payer: Self-pay

## 2021-03-17 VITALS — BP 151/73 | HR 51

## 2021-03-17 DIAGNOSIS — N401 Enlarged prostate with lower urinary tract symptoms: Secondary | ICD-10-CM

## 2021-03-17 DIAGNOSIS — N138 Other obstructive and reflux uropathy: Secondary | ICD-10-CM

## 2021-03-17 DIAGNOSIS — R3129 Other microscopic hematuria: Secondary | ICD-10-CM

## 2021-03-17 DIAGNOSIS — N200 Calculus of kidney: Secondary | ICD-10-CM | POA: Diagnosis not present

## 2021-03-17 DIAGNOSIS — R350 Frequency of micturition: Secondary | ICD-10-CM | POA: Diagnosis not present

## 2021-03-17 LAB — URINALYSIS, ROUTINE W REFLEX MICROSCOPIC
Bilirubin, UA: NEGATIVE
Leukocytes,UA: NEGATIVE
Nitrite, UA: NEGATIVE
Specific Gravity, UA: 1.025 (ref 1.005–1.030)
Urobilinogen, Ur: 0.2 mg/dL (ref 0.2–1.0)
pH, UA: 5.5 (ref 5.0–7.5)

## 2021-03-17 LAB — MICROSCOPIC EXAMINATION
Bacteria, UA: NONE SEEN
Epithelial Cells (non renal): NONE SEEN /hpf (ref 0–10)
Renal Epithel, UA: NONE SEEN /hpf
WBC, UA: NONE SEEN /hpf (ref 0–5)

## 2021-03-17 MED ORDER — FINASTERIDE 5 MG PO TABS: 5.0000 mg | ORAL_TABLET | Freq: Every day | ORAL | 3 refills | Status: DC

## 2021-03-17 MED ORDER — CIPROFLOXACIN HCL 500 MG PO TABS
500.0000 mg | ORAL_TABLET | Freq: Once | ORAL | Status: AC
  Administered 2021-03-17: 500 mg via ORAL

## 2021-03-17 NOTE — Progress Notes (Signed)
Urological Symptom Review  Patient is experiencing the following symptoms: Weak stream Erection problems (male only)   Review of Systems  Gastrointestinal (upper)  : Negative for upper GI symptoms  Gastrointestinal (lower) : Negative for lower GI symptoms  Constitutional : Fatigue  Skin: Negative for skin symptoms  Eyes: Negative for eye symptoms  Ear/Nose/Throat : Sinus problems  Hematologic/Lymphatic: Negative for Hematologic/Lymphatic symptoms  Cardiovascular : Negative for cardiovascular symptoms  Respiratory : Negative for respiratory symptoms  Endocrine: Negative for endocrine symptoms  Musculoskeletal: Negative for musculoskeletal symptoms  Neurological: Negative for neurological symptoms  Psychologic: Negative for psychiatric symptoms

## 2021-03-17 NOTE — Progress Notes (Signed)
Subjective:  1. Microhematuria   2. BPH with urinary obstruction   3. Urinary frequency   4. Renal stones     03/17/21: Jason Alvarez returns today in f/u from a CT hematuria study for microhematuria noted in April.  He was found to have punctate renal stones and a large prostate but no other significant GU findings.   He was given tamsulosin at his last visit but he has never started that.  He has previously declined finasteride.   He has had a lot on his plate dealing with his wife.  His UA still has 3-10 RBC's.  His IPSS is stable at 17 with a weak stream and a sensation of incomplete emptying.  He has been started on Farxiga and has 2+ glucose.  His Cr was 1.1.     11/04/20: Jason Alvarez returns today in f/u for his several year history of BPH with BOO and an elevated PSA. His PSA is 7.5 which is fairly stable.  It was 6.1 in 11/26/18.  It is 8.6 with a 34% f/t ratio on 12/04/19 which is in his usual range.  He had a prostate MRI in the fall of 2017 that was negative but his prostate was very large. He was given a script for finasteride but he never started it.  His IPSS is 17 which is stable.  He has an episode of enuresis recently but that hasn't recurred.  He has no associated signs or symptoms.  Cr is 1.27 on 09/13/20.   Jason Alvarez has had occasional recurrent balanitis but it has responded to nystatin and hasn't had to use it recently.    IPSS     Row Name 03/17/21 0900         International Prostate Symptom Score   How often have you had the sensation of not emptying your bladder? About half the time     How often have you had to urinate less than every two hours? Less than 1 in 5 times     How often have you found you stopped and started again several times when you urinated? Less than half the time     How often have you found it difficult to postpone urination? Less than 1 in 5 times     How often have you had a weak urinary stream? Almost always     How often have you had to strain to  start urination? About half the time     How many times did you typically get up at night to urinate? 2 Times     Total IPSS Score 17           Quality of Life due to urinary symptoms   If you were to spend the rest of your life with your urinary condition just the way it is now how would you feel about that? Pleased                   ROS:  ROS:  A complete review of systems was performed.  All systems are negative except for pertinent findings as noted.   Review of Systems  All other systems reviewed and are negative.  Allergies  Allergen Reactions   Penicillins Other (See Comments)    Childhood allergy Has patient had a PCN reaction causing immediate rash, facial/tongue/throat swelling, SOB or lightheadedness with hypotension: Unknown Has patient had a PCN reaction causing severe rash involving mucus membranes or skin necrosis: Unknown Has patient had a PCN  reaction that required hospitalization: Unknown Has patient had a PCN reaction occurring within the last 10 years: Unknown If all of the above answers are "NO", then may proceed with Cephalosporin use.    Outpatient Encounter Medications as of 03/17/2021  Medication Sig   ALPRAZolam (XANAX) 1 MG tablet Taking 1 tablet by mouth 4 times daily   amLODipine (NORVASC) 10 MG tablet Take 1 tablet (10 mg total) by mouth daily.   finasteride (PROSCAR) 5 MG tablet Take 1 tablet (5 mg total) by mouth daily.   flecainide (TAMBOCOR) 100 MG tablet Take 0.5 tablets (50 mg total) by mouth 2 (two) times daily.   FLUAD 0.5 ML SUSY Inject as directed See admin instructions.   glipiZIDE-metformin (METAGLIP) 5-500 MG per tablet Take 1 tablet by mouth 2 (two) times daily after a meal.   hydrochlorothiazide (HYDRODIURIL) 25 MG tablet Take 25 mg by mouth daily.   metoprolol succinate (TOPROL-XL) 50 MG 24 hr tablet TAKE ONE (1) TABLET EACH DAY   OneTouch Delica Lancets 99991111 MISC    ONETOUCH VERIO test strip 1 each 2 (two) times daily.    ramipril (ALTACE) 10 MG capsule TAKE ONE CAPSULE BY MOUTH TWICE A DAY   warfarin (COUMADIN) 5 MG tablet TAKE 1 TO 1&1/2 TABLET DAILY AS DIRECTED   [DISCONTINUED] HYDROcodone-acetaminophen (NORCO) 10-325 MG tablet Take by mouth. (Patient not taking: Reported on 03/17/2021)   [DISCONTINUED] tamsulosin (FLOMAX) 0.4 MG CAPS capsule Take 1 capsule (0.4 mg total) by mouth daily. (Patient not taking: Reported on 03/17/2021)   [EXPIRED] ciprofloxacin (CIPRO) tablet 500 mg    No facility-administered encounter medications on file as of 03/17/2021.    Past Medical History:  Diagnosis Date   A-fib ([place])    paf with NSVT on MCOT, on coumadin, echo 01/31/08-nl lv, mild aortic valve sclerosis    Aneurysm ([facility])    Anxiety    Atrial fibrillation ([facility])    Carotid bruit    doppler 05/17/12-mild plaque with no diameter reduction   Chronic anticoagulation    Chronic ITP (idiopathic thrombocytopenia) ([facility]) 04/10/2012   Low grade thrombocytopenia consistent with chronic ITP not in need of therapy.    DM (diabetes mellitus) ([place])    Dysrhythmia    afib   H/O cardiovascular stress test 01/31/08   no ishcemia, ef 61%   History of kidney stones    HTN (hypertension)    Hypertension    ITP (idiopathic thrombocytopenic purpura)    Thrombocytopenia ([place])     Past Surgical History:  Procedure Laterality Date   CIRCUMCISION     COLONOSCOPY  06/25/2012   Procedure: COLONOSCOPY;  Surgeon: Jamesetta So, MD;  [facility];  Service: Gastroenterology;  Laterality: N/A;   COLONOSCOPY N/A 01/30/2017   Procedure: COLONOSCOPY;  Surgeon: Aviva Signs, MD;  Location: [facility];  Service: Gastroenterology;  Laterality: N/A;   KNEE ARTHROSCOPY     right and left knee/1992 and 2000   POLYPECTOMY  01/30/2017   Procedure: POLYPECTOMY;  Surgeon: Aviva Signs, MD;  Location: [facility];  Service: Gastroenterology;;  colon   VASECTOMY      Social History   Socioeconomic History   Marital status: Married     Spouse name: Not on file   Number of children: Not on file   Years of education: Not on file   Highest education level: Not on file  Occupational History   Not on file  Tobacco Use   Smoking status: Never  Smokeless tobacco: Never  Vaping Use   Vaping Use: Never used  Substance and Sexual Activity   Alcohol use: No   Drug use: No   Sexual activity: Not on file  Other Topics Concern   Not on file  Social History Narrative   Not on file   Social Determinants of Health   Financial Resource Strain: Not on file  Food Insecurity: Not on file  Transportation Needs: Not on file  Physical Activity: Not on file  Stress: Not on file  Social Connections: Not on file  Intimate Partner Violence: Not on file    Family History  Problem Relation Age of Onset   Prostate cancer Father    Cancer Sister        breast   Cancer Brother        lung   Melanoma Mother        Objective: Vitals:   03/17/21 0926  BP: (!) 151/73  Pulse: (!) 51     Physical Exam  Lab Results:  Results for orders placed or performed in visit on 03/17/21 (from the past 24 hour(s))  Urinalysis, Routine w reflex microscopic     Status: Abnormal   Collection Time: 03/17/21  9:24 AM  Result Value Ref Range   Specific Gravity, UA 1.025 1.005 - 1.030   pH, UA 5.5 5.0 - 7.5   Color, UA Yellow Yellow   Appearance Ur Clear Clear   Leukocytes,UA Negative Negative   Protein,UA 1+ (A) Negative/Trace   Glucose, UA 2+ (A) Negative   Ketones, UA Trace (A) Negative   RBC, UA 1+ (A) Negative   Bilirubin, UA Negative Negative   Urobilinogen, Ur 0.2 0.2 - 1.0 mg/dL   Nitrite, UA Negative Negative   Microscopic Examination See below:    Narrative   Performed at:  [facility] [address], [place], [place]  [id number] Lab Director: Mina Marble MT, Phone:  [phone]  Microscopic Examination     Status: Abnormal   Collection Time: 03/17/21  9:24 AM   Urine  Result Value Ref Range   WBC,  UA None seen 0 - 5 /hpf   RBC 3-10 (A) 0 - 2 /hpf   Epithelial Cells (non renal) None seen 0 - 10 /hpf   Renal Epithel, UA None seen None seen /hpf   Mucus, UA Present Not Estab.   Bacteria, UA None seen None seen/Few   Narrative   Performed at:  [facility] [address] [place], [place]  [id number] Lab Director: [place], Phone:  [phone]    BMET No results for input(s): NA, K, CL, CO2, GLUCOSE, BUN, CREATININE, CALCIUM in the last 72 hours. PSA Lab Results  Component Value Date   PSA1 7.5 (H) 06/09/2020    PSA  Date Value Ref Range Status  12/04/2019 8.6  Final  11/26/2018 6.1  Final   No results found for: TESTOSTERONE    Results for orders placed or performed in visit on 03/17/21 (from the past 24 hour(s))  Urinalysis, Routine w reflex microscopic     Status: Abnormal   Collection Time: 03/17/21  9:24 AM  Result Value Ref Range   Specific Gravity, UA 1.025 1.005 - 1.030   pH, UA 5.5 5.0 - 7.5   Color, UA Yellow Yellow   Appearance Ur Clear Clear   Leukocytes,UA Negative Negative   Protein,UA 1+ (A) Negative/Trace   Glucose, UA 2+ (A) Negative   Ketones, UA  Trace (A) Negative   RBC, UA 1+ (A) Negative   Bilirubin, UA Negative Negative   Urobilinogen, Ur 0.2 0.2 - 1.0 mg/dL   Nitrite, UA Negative Negative   Microscopic Examination See below:    Narrative   Performed at:  [facility] [address] [place], [place]  [id number] Lab Director: [place], Phone:  [phone]  Microscopic Examination     Status: Abnormal   Collection Time: 03/17/21  9:24 AM   Urine  Result Value Ref Range   WBC, UA None seen 0 - 5 /hpf   RBC 3-10 (A) 0 - 2 /hpf   Epithelial Cells (non renal) None seen 0 - 10 /hpf   Renal Epithel, UA None seen None seen /hpf   Mucus, UA Present Not Estab.   Bacteria, UA None seen None seen/Few   Narrative   Performed at:  [facility] [address] [place], [place]   [id number] Lab Director: Mina Marble MT, Phone:  [phone]     Studies/Results: VAS [place] ABI WITH/WO TBI  Result Date: 02/09/2021  LOWER EXTREMITY DOPPLER STUDY Patient Name:  KEIR TRAUGHBER  Date of Exam:   02/09/2021 Medical Rec #: [id number]        Accession #:    [id number] Date of Birth: January 26, 1941         Patient Gender: M Patient Age:   80Y Exam Location:  [facility] Procedure:      VAS [place] ABI WITH/WO TBI Referring Phys: [place] --------------------------------------------------------------------------------  Indications: Legs feel weak. No specific claudication symptoms. High Risk Factors: Diabetes, no history of smoking.  Performing Technologist: Ralene Cork RVT  Examination Guidelines: A complete evaluation includes at minimum, Doppler waveform signals and systolic blood pressure reading at the level of bilateral brachial, anterior tibial, and posterior tibial arteries, when vessel segments are accessible. Bilateral testing is considered an integral part of a complete examination. Photoelectric Plethysmograph (PPG) waveforms and toe systolic pressure readings are included as required and additional duplex testing as needed. Limited examinations for reoccurring indications may be performed as noted.  ABI Findings: +---------+------------------+-----+---------+--------+ Right    Rt Pressure (mmHg)IndexWaveform Comment  +---------+------------------+-----+---------+--------+ Brachial 169                                      +---------+------------------+-----+---------+--------+ PTA      255               1.51 triphasic         +---------+------------------+-----+---------+--------+ DP       255               1.51 triphasic         +---------+------------------+-----+---------+--------+ Great Toe127               0.75                   +---------+------------------+-----+---------+--------+  +---------+------------------+-----+---------+-------+ Left     Lt Pressure (mmHg)IndexWaveform Comment +---------+------------------+-----+---------+-------+ Brachial 164                                     +---------+------------------+-----+---------+-------+ PTA      255               1.51 triphasic        +---------+------------------+-----+---------+-------+  DP       255               1.51 biphasic         +---------+------------------+-----+---------+-------+ Great Toe122               0.72                  +---------+------------------+-----+---------+-------+ +-------+-----------+-----------+------------+------------+ ABI/TBIToday's ABIToday's TBIPrevious ABIPrevious TBI +-------+-----------+-----------+------------+------------+ Right  [place]         0.75       [place]          0.86         +-------+-----------+-----------+------------+------------+ Left   [place]         0.72       [place]          0.72         +-------+-----------+-----------+------------+------------+  Summary: Right: Resting right ankle-brachial index indicates noncompressible right lower extremity arteries. The right toe-brachial index is normal. RT great toe pressure = 127 mmHg. Left: Resting left ankle-brachial index indicates noncompressible left lower extremity arteries. The left toe-brachial index is normal. LT Great toe pressure = 122 mmHg.  *See table(s) above for measurements and observations.  Electronically signed by Curt Jews MD on 02/09/2021 at 3:30:28 PM.    Final    CT HEMATURIA WORKUP  Result Date: 12/30/2020 CLINICAL DATA:  Microhematuria. EXAM: CT ABDOMEN AND PELVIS WITHOUT AND WITH CONTRAST TECHNIQUE: Multidetector CT imaging of the abdomen and pelvis was performed following the standard protocol before and following the bolus administration of intravenous contrast. CONTRAST:  176m OMNIPAQUE IOHEXOL 300 MG/ML SOLN, 234mOMNIPAQUE IOHEXOL 300 MG/ML SOLN COMPARISON:  07/04/2007  FINDINGS: Lower chest: No acute abnormality. Hepatobiliary: No focal liver abnormality is seen. No gallstones, gallbladder wall thickening, or biliary dilatation. Pancreas: Unremarkable. No pancreatic ductal dilatation or surrounding inflammatory changes. Spleen: Normal in size without focal abnormality. Adrenals/Urinary Tract: Normal adrenal glands. There is a punctate stone within the upper pole of the left kidney midpole measuring 2 mm, image 68/11. The within the inferior pole of the left kidney there is a 2 mm stone, image 58/11. No right kidney stones. The no kidney mass or hydronephrosis. The ureters appear patent without signs of ureteral calculi or focal enhancing lesion. Prostate gland enlargement has mass effect upon the bladder base. No focal bladder lesion. Stomach/Bowel: Stomach is within normal limits. Appendix appears normal. No evidence of bowel wall thickening, distention, or inflammatory changes. Vascular/Lymphatic: Aortic atherosclerosis. No abdominopelvic adenopathy. Mild soft tissue stranding within the ileal jejunal small bowel mesentery without concomitant adenopathy. Favor benign incidental finding. Reproductive: Prostate gland is enlarged measuring 6.7 x 7.8 by 7.2 cm (volume = 200 cm^3). Other: No ascites or focal fluid collections. Tiny fat containing umbilical hernia noted. Musculoskeletal: No acute or significant osseous findings. IMPRESSION: 1. No acute findings within the abdomen or pelvis. 2. Punctate nonobstructing left renal calculi. 3. Prostate gland enlargement. 4. Aortic atherosclerosis. Aortic Atherosclerosis (ICD10-I70.0). Electronically Signed   By: TaKerby Moors.D.   On: 12/30/2020 15:20    Procedure: Flexible cystoscopy.  He was prepped with betadine and 2% lidocaine jelly.  Cipro '500mg'$  po was given.  The scope was passed.  The urethra is normal.  The prostate is about 6cm with bilobar hyperplasia with intravesical extension and some benign appearing polyps at the  bladder neck.  The prostatic tissue was friable and bleed with gentle scope passage.  The bladder has mild trabeculation without tumors  or stones noted.  There were no complications.     Assessment & Plan: BPH with BOO.  He has  Stable LUTS with a history of an elevated PSA.Marland Kitchen   He never took the finasteride or the tamsulosin.   I have encouraged him to take the finasteride and reviewed the side effects and sent a refill.    Microhematuria.  The CT just shows tiny renal stones.    Cystoscopy suggests a prostatic source of bleeding.  He will hold warfarin for a couple of days since he had some prostate bleeding with the scope passage.    Meds ordered this encounter  Medications   ciprofloxacin (CIPRO) tablet 500 mg   finasteride (PROSCAR) 5 MG tablet    Sig: Take 1 tablet (5 mg total) by mouth daily.    Dispense:  90 tablet    Refill:  3     Orders Placed This Encounter  Procedures   Microscopic Examination   Urinalysis, Routine w reflex microscopic      Return in about 3 months (around 06/16/2021).   CC: Redmond School, MD      Irine Seal 03/17/2021 Patient ID: [name], male   DOB: 1940/12/17, 80 y.o.   MRN: [id number]

## 2021-03-25 ENCOUNTER — Other Ambulatory Visit (HOSPITAL_COMMUNITY): Payer: Self-pay | Admitting: Nurse Practitioner

## 2021-04-07 ENCOUNTER — Other Ambulatory Visit (HOSPITAL_COMMUNITY): Payer: Self-pay | Admitting: Nurse Practitioner

## 2021-04-17 ENCOUNTER — Other Ambulatory Visit: Payer: Self-pay | Admitting: Cardiovascular Disease

## 2021-04-25 ENCOUNTER — Ambulatory Visit (INDEPENDENT_AMBULATORY_CARE_PROVIDER_SITE_OTHER): Payer: Medicare HMO | Admitting: *Deleted

## 2021-04-25 DIAGNOSIS — I4891 Unspecified atrial fibrillation: Secondary | ICD-10-CM | POA: Diagnosis not present

## 2021-04-25 DIAGNOSIS — Z5181 Encounter for therapeutic drug level monitoring: Secondary | ICD-10-CM

## 2021-04-25 LAB — POCT INR: INR: 2.6 (ref 2.0–3.0)

## 2021-04-25 NOTE — Patient Instructions (Signed)
Continue warfarin 1 tablet daily  Continue greens/salad    Repeat INR in 6 weeks.  

## 2021-05-24 ENCOUNTER — Ambulatory Visit (HOSPITAL_COMMUNITY)
Admission: RE | Admit: 2021-05-24 | Discharge: 2021-05-24 | Disposition: A | Discharge status: Home / Self Care | Payer: Medicare HMO | Source: Ambulatory Visit | Attending: Nurse Practitioner | Admitting: Nurse Practitioner

## 2021-05-24 ENCOUNTER — Encounter (HOSPITAL_COMMUNITY): Payer: Self-pay | Admitting: Nurse Practitioner

## 2021-05-24 ENCOUNTER — Other Ambulatory Visit: Payer: Self-pay

## 2021-05-24 VITALS — BP 188/64 | HR 54 | Ht 75.0 in | Wt 222.0 lb

## 2021-05-24 DIAGNOSIS — Z09 Encounter for follow-up examination after completed treatment for conditions other than malignant neoplasm: Secondary | ICD-10-CM | POA: Diagnosis not present

## 2021-05-24 DIAGNOSIS — Z7901 Long term (current) use of anticoagulants: Secondary | ICD-10-CM | POA: Diagnosis not present

## 2021-05-24 DIAGNOSIS — I4891 Unspecified atrial fibrillation: Secondary | ICD-10-CM | POA: Diagnosis not present

## 2021-05-24 DIAGNOSIS — I480 Paroxysmal atrial fibrillation: Secondary | ICD-10-CM | POA: Insufficient documentation

## 2021-05-24 DIAGNOSIS — R609 Edema, unspecified: Secondary | ICD-10-CM | POA: Insufficient documentation

## 2021-05-24 DIAGNOSIS — Z79899 Other long term (current) drug therapy: Secondary | ICD-10-CM | POA: Diagnosis not present

## 2021-05-24 DIAGNOSIS — D6869 Other thrombophilia: Secondary | ICD-10-CM | POA: Diagnosis not present

## 2021-05-24 DIAGNOSIS — Z8679 Personal history of other diseases of the circulatory system: Secondary | ICD-10-CM | POA: Insufficient documentation

## 2021-05-24 DIAGNOSIS — I10 Essential (primary) hypertension: Secondary | ICD-10-CM | POA: Diagnosis not present

## 2021-05-24 LAB — CBC
HCT: 37.3 % — ABNORMAL LOW (ref 39.0–52.0)
Hemoglobin: 11.9 g/dL — ABNORMAL LOW (ref 13.0–17.0)
MCH: 30.3 pg (ref 26.0–34.0)
MCHC: 31.9 g/dL (ref 30.0–36.0)
MCV: 94.9 fL (ref 80.0–100.0)
Platelets: 132 10*3/uL — ABNORMAL LOW (ref 150–400)
RBC: 3.93 MIL/uL — ABNORMAL LOW (ref 4.22–5.81)
RDW: 12.7 % (ref 11.5–15.5)
WBC: 5.4 10*3/uL (ref 4.0–10.5)
nRBC: 0 % (ref 0.0–0.2)

## 2021-05-24 LAB — BASIC METABOLIC PANEL
Anion gap: 7 (ref 5–15)
BUN: 23 mg/dL (ref 8–23)
CO2: 29 mmol/L (ref 22–32)
Calcium: 9 mg/dL (ref 8.9–10.3)
Chloride: 103 mmol/L (ref 98–111)
Creatinine, Ser: 1.09 mg/dL (ref 0.61–1.24)
GFR, Estimated: >60 mL/min (ref >60)
Glucose, Bld: 160 mg/dL — ABNORMAL HIGH (ref 70–99)
Potassium: 4.5 mmol/L (ref 3.5–5.1)
Sodium: 139 mmol/L (ref 135–145)

## 2021-05-24 MED ORDER — FLECAINIDE ACETATE 100 MG PO TABS: 50.0000 mg | ORAL_TABLET | Freq: Two times a day (BID) | ORAL | Status: DC

## 2021-05-24 NOTE — Progress Notes (Signed)
Patient ID: NAZIR HACKER, male   DOB: 12-21-1940, 80 y.o.   MRN: [id number]     Primary Care Physician: Redmond School, MD Referring Physician: Dr. Charlyne Petrin is a 80 y.o. male with a h/o PAF on flecainide. He reports that he had a mechanical fall one month ago  but did not suffer any significant injury, SInce then he feels that he may have had more fluttering but it is very difficult for him to  describe.Marland Kitchen   His blood pressure is also elevated but on recheck 140 /50. He shows me his BP readings many of which are showing systolics of 834-196. He was on this dose in the past but he lowered as he felt BP readings were getting too low. He tells me that he was started on a baby asa a few months ago by his PCP, he did not know why. I see in Care everywhere that he was seen in October for dizziness by Neurology at [facility] and was found to have vestibular stenosis so may be 2/2 to this. He feels he is bruising more with also being on warfarin, CHA2DS2VASc score of at least 4.  He also admits that his blood glucose not too long ago got up to 15. He made some dietary changes and brought  It down to 7 but has slipped again in his dietary control.   F/u in afib clinic, 11/17/20. He reports low afib burden. His 1st degree AV block has progressed to 272 ms so will reduce flecainide to 50 mg bid. Otherwise, no complaints today.   F/u in afib clinic, 11/23/20. He wanted to be seen as he noted some increase in fluttering for a few days after reducing flecainide dose but nothing sustained. He has not noted any afib for the last 2 days. His PR has reduced in interval length to 244 ms from 272 ms. We discussed options but for now he wants to continue with flecainide at 50 mg bid.   F/u in afib clinic, 05/24/21. He is having some breakthrough afib but is still happy with current treatment and does not want to change AAD. He is in sinus rhythm today. He continues on flecainide. He has noted some ankle edema x 2  days. He continues on warfarin for a CHA2DS2VASc  score of at least 4. BP is elevated here but I reviewed BP's at home and BP's are well managed.     Today, negative for  chest pain, shortness of breath, orthopnea, PND, lower extremity edema, dizziness, presyncope, syncope, or neurologic sequela. The patient is tolerating medications without difficulties and is otherwise without complaint today.   Past Medical History:  Diagnosis Date   A-fib ([place])    paf with NSVT on MCOT, on coumadin, echo 01/31/08-nl lv, mild aortic valve sclerosis    Aneurysm ([facility])    Anxiety    Atrial fibrillation (HCC)    Carotid bruit    doppler 05/17/12-mild plaque with no diameter reduction   Chronic anticoagulation    Chronic ITP (idiopathic thrombocytopenia) (HCC) 04/10/2012   Low grade thrombocytopenia consistent with chronic ITP not in need of therapy.    DM (diabetes mellitus) ([place])    Dysrhythmia    afib   H/O cardiovascular stress test 01/31/08   no ishcemia, ef 61%   History of kidney stones    HTN (hypertension)    Hypertension    ITP (idiopathic thrombocytopenic purpura)    Thrombocytopenia (HCC)    Past  Surgical History:  Procedure Laterality Date   CIRCUMCISION     COLONOSCOPY  06/25/2012   Procedure: COLONOSCOPY;  Surgeon: Jamesetta So, MD;  [facility];  Service: Gastroenterology;  Laterality: N/A;   COLONOSCOPY N/A 01/30/2017   Procedure: COLONOSCOPY;  Surgeon: Aviva Signs, MD;  [facility];  Service: Gastroenterology;  Laterality: N/A;   KNEE ARTHROSCOPY     right and left knee/1992 and 2000   POLYPECTOMY  01/30/2017   Procedure: POLYPECTOMY;  Surgeon: Aviva Signs, MD;  [facility]: [facility];  Service: Gastroenterology;;  colon   VASECTOMY      Current Outpatient Medications  Medication Sig Dispense Refill   ALPRAZolam (XANAX) 1 MG tablet Taking 1 tablet by mouth 4 times daily     amLODipine (NORVASC) 10 MG tablet Take 1 tablet (10 mg total) by mouth  daily. 30 tablet 6   FLUAD 0.5 ML SUSY Inject as directed See admin instructions.     fluticasone (FLONASE) 50 MCG/ACT nasal spray Place into both nostrils.     glipiZIDE-metformin (METAGLIP) 5-500 MG per tablet Take 1 tablet by mouth 2 (two) times daily after a meal.     hydrochlorothiazide (HYDRODIURIL) 25 MG tablet Take 25 mg by mouth daily.     metoprolol succinate (TOPROL-XL) 50 MG 24 hr tablet TAKE ONE (1) TABLET EACH DAY 90 tablet 1   OneTouch Delica Lancets 81K MISC      ONETOUCH VERIO test strip 1 each 2 (two) times daily.     ramipril (ALTACE) 10 MG capsule TAKE ONE CAPSULE BY MOUTH TWICE A DAY 180 capsule 2   warfarin (COUMADIN) 5 MG tablet TAKE 1 TO 1&1/2 TABLET DAILY AS DIRECTED 120 tablet 2   diclofenac Sodium (VOLTAREN) 1 % GEL SMARTSIG:2 Gram(s) Topical 3 Times Daily PRN     finasteride (PROSCAR) 5 MG tablet Take 1 tablet (5 mg total) by mouth daily. (Patient not taking: Reported on 05/24/2021) 90 tablet 3   flecainide (TAMBOCOR) 100 MG tablet Take 0.5 tablets (50 mg total) by mouth 2 (two) times daily.     No current facility-administered medications for this encounter.    Allergies  Allergen Reactions   Penicillins Other (See Comments)    Childhood allergy Has patient had a PCN reaction causing immediate rash, facial/tongue/throat swelling, SOB or lightheadedness with hypotension: Unknown Has patient had a PCN reaction causing severe rash involving mucus membranes or skin necrosis: Unknown Has patient had a PCN reaction that required hospitalization: Unknown Has patient had a PCN reaction occurring within the last 10 years: Unknown If all of the above answers are "NO", then may proceed with Cephalosporin use.    Social History   Socioeconomic History   Marital status: Married    Spouse name: Not on file   Number of children: Not on file   Years of education: Not on file   Highest education level: Not on file  Occupational History   Not on file  Tobacco Use    Smoking status: Never   Smokeless tobacco: Never  Vaping Use   Vaping Use: Never used  Substance and Sexual Activity   Alcohol use: No   Drug use: No   Sexual activity: Not on file  Other Topics Concern   Not on file  Social History Narrative   Not on file   Social Determinants of Health   Financial Resource Strain: Not on file  Food Insecurity: Not on file  Transportation Needs: Not on file  Physical Activity: Not on file  Stress: Not on file  Social Connections: Not on file  Intimate Partner Violence: Not on file    Family History  Problem Relation Age of Onset   Prostate cancer Father    Cancer Sister        breast   Cancer Brother        lung   Melanoma Mother     ROS- All systems are reviewed and negative except as per the HPI above  Physical Exam: Vitals:   05/24/21 0817  BP: (!) 188/64  Pulse: (!) 54  Weight: 100.7 kg  Height: 6\' 3"  (1.905 m)    GEN- The patient is well appearing, alert and oriented x 3 today.   Head- normocephalic, atraumatic Eyes-  Sclera clear, conjunctiva pink Ears- hearing intact Oropharynx- clear Neck- supple, no JVP Lymph- no cervical lymphadenopathy Lungs- Clear to ausculation bilaterally, normal work of breathing Heart- Regular rate and rhythm, no murmurs, rubs or gallops, PMI not laterally displaced Extremities- no clubbing, cyanosis, or edema MS- no significant deformity or atrophy Skin- no rash or lesion Psych- euthymic mood, full affect Neuro- strength and sensation are intact  EKG- Sinus brady at 55 bpm, pr int  272 ms,  qrs int 122 ms, qtc 457 Epic records reviewed  Assessment and Plan: 1. Paroxysmal atrial fibrillation  Decreased flecainide 100 mg bid to 50 mg bid for progression of first degree AV block He is having some breakthrough afib on lowere dose but he is not wanting to switch to Tikosyn or amiodarone at this point  For now he will continue the course  Continue metoprolol 50 mg daily, cannot increase  for bradycardia at baseline   2.HTN Elevated here but stable at home  3. Chadsvasc score of at least 4 Continue warfarin, followed by [facility] office   4. Pedal edema Has just noted for the last 2 days Elevate feet Avoid salt  HCTZ as ordered If continues, can switch HCTZ for lasix    F/u in 6 months, sooner if issues    Butch Penny C. Chrisotpher Rivero, [facility] [address] [place], [place] [address] [phone]

## 2021-06-08 ENCOUNTER — Ambulatory Visit (INDEPENDENT_AMBULATORY_CARE_PROVIDER_SITE_OTHER): Payer: Medicare HMO | Admitting: *Deleted

## 2021-06-08 DIAGNOSIS — Z5181 Encounter for therapeutic drug level monitoring: Secondary | ICD-10-CM

## 2021-06-08 DIAGNOSIS — I4891 Unspecified atrial fibrillation: Secondary | ICD-10-CM

## 2021-06-08 LAB — POCT INR: INR: 2.2 (ref 2.0–3.0)

## 2021-06-08 NOTE — Patient Instructions (Signed)
Description   Continue warfarin 1 tablet daily  Continue greens/salad    Repeat INR in 6 weeks.

## 2021-06-23 ENCOUNTER — Ambulatory Visit: Payer: Medicare HMO | Admitting: Urology

## 2021-07-14 ENCOUNTER — Ambulatory Visit: Payer: Medicare HMO | Admitting: Urology

## 2021-07-28 ENCOUNTER — Ambulatory Visit: Payer: Medicare HMO | Admitting: Urology

## 2021-07-28 ENCOUNTER — Ambulatory Visit: Payer: Medicare HMO | Admitting: *Deleted

## 2021-07-28 DIAGNOSIS — I4891 Unspecified atrial fibrillation: Secondary | ICD-10-CM

## 2021-07-28 DIAGNOSIS — Z5181 Encounter for therapeutic drug level monitoring: Secondary | ICD-10-CM | POA: Diagnosis not present

## 2021-07-28 LAB — POCT INR: INR: 2.8 (ref 2.0–3.0)

## 2021-07-28 NOTE — Patient Instructions (Signed)
Continue warfarin 1 tablet daily  Continue greens/salad    Repeat INR in 6 weeks.  

## 2021-09-01 ENCOUNTER — Other Ambulatory Visit: Payer: Self-pay

## 2021-09-01 ENCOUNTER — Ambulatory Visit (INDEPENDENT_AMBULATORY_CARE_PROVIDER_SITE_OTHER): Payer: Medicare HMO | Admitting: Physician Assistant

## 2021-09-01 VITALS — BP 147/63 | HR 57

## 2021-09-01 DIAGNOSIS — N401 Enlarged prostate with lower urinary tract symptoms: Secondary | ICD-10-CM | POA: Diagnosis not present

## 2021-09-01 DIAGNOSIS — N138 Other obstructive and reflux uropathy: Secondary | ICD-10-CM

## 2021-09-01 DIAGNOSIS — R972 Elevated prostate specific antigen [PSA]: Secondary | ICD-10-CM

## 2021-09-01 LAB — URINALYSIS, ROUTINE W REFLEX MICROSCOPIC
Bilirubin, UA: NEGATIVE
Ketones, UA: NEGATIVE
Leukocytes,UA: NEGATIVE
Nitrite, UA: NEGATIVE
Specific Gravity, UA: 1.025 (ref 1.005–1.030)
Urobilinogen, Ur: 0.2 mg/dL (ref 0.2–1.0)
pH, UA: 5.5 (ref 5.0–7.5)

## 2021-09-01 LAB — MICROSCOPIC EXAMINATION
Renal Epithel, UA: NONE SEEN /hpf
WBC, UA: NONE SEEN /hpf (ref 0–5)

## 2021-09-01 MED ORDER — TAMSULOSIN HCL 0.4 MG PO CAPS: 0.4000 mg | ORAL_CAPSULE | Freq: Every day | ORAL | 11 refills | Status: DC

## 2021-09-01 NOTE — Progress Notes (Signed)
post void residual=2 

## 2021-09-01 NOTE — Progress Notes (Signed)
Assessment: 1. BPH with urinary obstruction   2. Elevated PSA     Plan: PSA level drawn today and will call patient with results.  Treatment pending levels.  Patient will continue current prescriptions of Flomax and finasteride as well as antifungal cream as needed for balanitis.  Discussed consistent glucose control to help alleviate frequency as well as balanitis.  Follow-up in 6 months with Dr. Jeffie Pollock for PVR and UA.  Chief Complaint: No chief complaint on file.   HPI: 09/01/21 Jason Alvarez is a 81 y.o. male who presents for continued evaluation of BPH, BOO, and elevated PSA with h/o B renal stones and microhematuria.  Patient also has history of occasional balanitis for which he uses antifungal cream.   LUTS symptoms today include urgency and weakness of stream with occasional frequency and straining.  He has 3 voids per night.  He reports that he is satisfied with his current urinary status and continues on Flomax, finasteride. IPSS = 19, quality-of-life = 1 UA = 3-10 WBCs, few bacteria, nitrite negative PVR= 42ml No recent PSA levels Last PSA drawn on 06/09/20 was 7.5. Down from 8.6 on 12/04/19.    03/17/21: Jason Alvarez returns today in f/u from a CT hematuria study for microhematuria noted in April.  He was found to have punctate renal stones and a large prostate but no other significant GU findings.   He was given tamsulosin at his last visit but he has never started that.  He has previously declined finasteride.   He has had a lot on his plate dealing with his wife.  His UA still has 3-10 RBC's.  His IPSS is stable at 17 with a weak stream and a sensation of incomplete emptying.  He has been started on Farxiga and has 2+ glucose.  His Cr was 1.1.   11/04/20: Jason Alvarez returns today in f/u for his several year history of BPH with BOO and an elevated PSA. His PSA is 7.5 which is fairly stable.  It was 6.1 in 11/26/18.  It is 8.6 with a 34% f/t ratio on 12/04/19 which is in his usual  range.  He had a prostate MRI in the fall of 2017 that was negative but his prostate was very large. He was given a script for finasteride but he never started it.  His IPSS is 17 which is stable.  He has an episode of enuresis recently but that hasn't recurred.  He has no associated signs or symptoms.  Cr is 1.27 on 09/13/20.    Jason Alvarez has had occasional recurrent balanitis but it has responded to nystatin and hasn't had to use it recently.    Portions of the above documentation were copied from a prior visit for review purposes only.  Allergies: Allergies  Allergen Reactions   Penicillins Other (See Comments)    Childhood allergy Has patient had a PCN reaction causing immediate rash, facial/tongue/throat swelling, SOB or lightheadedness with hypotension: Unknown Has patient had a PCN reaction causing severe rash involving mucus membranes or skin necrosis: Unknown Has patient had a PCN reaction that required hospitalization: Unknown Has patient had a PCN reaction occurring within the last 10 years: Unknown If all of the above answers are "NO", then may proceed with Cephalosporin use.    PMH: Past Medical History:  Diagnosis Date   A-fib ([place])    paf with NSVT on MCOT, on coumadin, echo 01/31/08-nl lv, mild aortic valve sclerosis    Aneurysm ([place])  Anxiety    Atrial fibrillation (HCC)    Carotid bruit    doppler 05/17/12-mild plaque with no diameter reduction   Chronic anticoagulation    Chronic ITP (idiopathic thrombocytopenia) (HCC) 04/10/2012   Low grade thrombocytopenia consistent with chronic ITP not in need of therapy.    DM (diabetes mellitus) ([place])    Dysrhythmia    afib   H/O cardiovascular stress test 01/31/08   no ishcemia, ef 61%   History of kidney stones    HTN (hypertension)    Hypertension    ITP (idiopathic thrombocytopenic purpura)    Thrombocytopenia (HCC)     PSH: Past Surgical History:  Procedure Laterality Date   CIRCUMCISION     COLONOSCOPY   06/25/2012   Procedure: COLONOSCOPY;  Surgeon: Jamesetta So, MD;  [facility];  Service: Gastroenterology;  Laterality: N/A;   COLONOSCOPY N/A 01/30/2017   Procedure: COLONOSCOPY;  Surgeon: Aviva Signs, MD;  [facility];  Service: Gastroenterology;  Laterality: N/A;   KNEE ARTHROSCOPY     right and left knee/1992 and 2000   POLYPECTOMY  01/30/2017   Procedure: POLYPECTOMY;  Surgeon: Aviva Signs, MD;  [facility];  Service: Gastroenterology;;  colon   VASECTOMY      SH: Social History   Tobacco Use   Smoking status: Never   Smokeless tobacco: Never  Vaping Use   Vaping Use: Never used  Substance Use Topics   Alcohol use: No   Drug use: No    ROS: Constitutional:  Negative for fever, chills, weight loss CV: Negative for chest pain, previous MI, hypertension Respiratory:  Negative for shortness of breath, wheezing, sleep apnea, frequent cough GI:  Negative for nausea, vomiting, bloody stool, GERD  PE: BP (!) 147/63    Pulse (!) 57  GENERAL APPEARANCE:  Well appearing, well developed, well nourished, NAD HEENT:  Atraumatic, normocephalic NECK:  Supple. Trachea midline ABDOMEN:  Soft, non-tender, no masses EXTREMITIES:  Moves all extremities well, without clubbing, cyanosis, or edema NEUROLOGIC:  Alert and oriented x 3, normal gait, CN II-XII grossly intact MENTAL STATUS:  appropriate BACK:  Non-tender to palpation, No CVAT SKIN:  Warm, dry, and intact   Results: Laboratory Data: Lab Results  Component Value Date   WBC 5.4 05/24/2021   HGB 11.9 (L) 05/24/2021   HCT 37.3 (L) 05/24/2021   MCV 94.9 05/24/2021   PLT 132 (L) 05/24/2021    Lab Results  Component Value Date   CREATININE 1.09 05/24/2021    Lab Results  Component Value Date   PSA 8.6 12/04/2019   PSA 6.1 11/26/2018    No results found for: TESTOSTERONE  Lab Results  Component Value Date   HGBA1C 7.4 (H) 04/08/2019    Urinalysis    Component Value  Date/Time   COLORURINE YELLOW 10/12/2017 1655   APPEARANCEUR Clear 03/17/2021 0924   LABSPEC 1.027 10/12/2017 1655   PHURINE 5.0 10/12/2017 1655   GLUCOSEU 2+ (A) 03/17/2021 0924   HGBUR NEGATIVE 10/12/2017 1655   BILIRUBINUR Negative 03/17/2021 0924   KETONESUR NEGATIVE 10/12/2017 1655   PROTEINUR 1+ (A) 03/17/2021 0924   PROTEINUR NEGATIVE 10/12/2017 1655   UROBILINOGEN negative (A) 12/12/2019 0849   NITRITE Negative 03/17/2021 0924   NITRITE NEGATIVE 10/12/2017 1655   LEUKOCYTESUR Negative 03/17/2021 0924    Lab Results  Component Value Date   LABMICR See below: 03/17/2021   WBCUA None seen 03/17/2021   LABEPIT None seen 03/17/2021   MUCUS Present  03/17/2021   BACTERIA None seen 03/17/2021    Pertinent Imaging:  No results found for this or any previous visit.   Results for orders placed during the hospital encounter of 12/30/20  CT HEMATURIA WORKUP  Narrative CLINICAL DATA:  Microhematuria.  EXAM: CT ABDOMEN AND PELVIS WITHOUT AND WITH CONTRAST  TECHNIQUE: Multidetector CT imaging of the abdomen and pelvis was performed following the standard protocol before and following the bolus administration of intravenous contrast.  CONTRAST:  126mL OMNIPAQUE IOHEXOL 300 MG/ML SOLN, 14mL OMNIPAQUE IOHEXOL 300 MG/ML SOLN  COMPARISON:  07/04/2007  FINDINGS: Lower chest: No acute abnormality.  Hepatobiliary: No focal liver abnormality is seen. No gallstones, gallbladder wall thickening, or biliary dilatation.  Pancreas: Unremarkable. No pancreatic ductal dilatation or surrounding inflammatory changes.  Spleen: Normal in size without focal abnormality.  Adrenals/Urinary Tract: Normal adrenal glands. There is a punctate stone within the upper pole of the left kidney midpole measuring 2 mm, image 68/11. The within the inferior pole of the left kidney there is a 2 mm stone, image 58/11. No right kidney stones. The no kidney mass or hydronephrosis. The ureters  appear patent without signs of ureteral calculi or focal enhancing lesion. Prostate gland enlargement has mass effect upon the bladder base. No focal bladder lesion.  Stomach/Bowel: Stomach is within normal limits. Appendix appears normal. No evidence of bowel wall thickening, distention, or inflammatory changes.  Vascular/Lymphatic: Aortic atherosclerosis. No abdominopelvic adenopathy. Mild soft tissue stranding within the ileal jejunal small bowel mesentery without concomitant adenopathy. Favor benign incidental finding.  Reproductive: Prostate gland is enlarged measuring 6.7 x 7.8 by 7.2 cm (volume = 200 cm^3).  Other: No ascites or focal fluid collections. Tiny fat containing umbilical hernia noted.  Musculoskeletal: No acute or significant osseous findings.  IMPRESSION: 1. No acute findings within the abdomen or pelvis. 2. Punctate nonobstructing left renal calculi. 3. Prostate gland enlargement. 4. Aortic atherosclerosis.  Aortic Atherosclerosis (ICD10-I70.0).   Electronically Signed By: Kerby Moors M.D. On: 12/30/2020 15:20  No results found for this or any previous visit.  No results found for this or any previous visit (from the past 24 hour(s)).

## 2021-09-02 LAB — PSA: Prostate Specific Ag, Serum: 7.5 ng/mL — ABNORMAL HIGH (ref 0.0–4.0)

## 2021-09-08 ENCOUNTER — Ambulatory Visit (INDEPENDENT_AMBULATORY_CARE_PROVIDER_SITE_OTHER): Payer: Medicare HMO | Admitting: *Deleted

## 2021-09-08 DIAGNOSIS — I4891 Unspecified atrial fibrillation: Secondary | ICD-10-CM

## 2021-09-08 DIAGNOSIS — Z5181 Encounter for therapeutic drug level monitoring: Secondary | ICD-10-CM

## 2021-09-08 LAB — POCT INR: INR: 2.9 (ref 2.0–3.0)

## 2021-09-08 NOTE — Patient Instructions (Signed)
Continue warfarin 1 tablet daily  Continue greens/salad    Repeat INR in 6 weeks.  

## 2021-09-15 ENCOUNTER — Other Ambulatory Visit (HOSPITAL_COMMUNITY): Payer: Self-pay | Admitting: Nurse Practitioner

## 2021-09-27 ENCOUNTER — Other Ambulatory Visit (HOSPITAL_COMMUNITY): Payer: Self-pay | Admitting: *Deleted

## 2021-09-27 MED ORDER — FLECAINIDE ACETATE 50 MG PO TABS: 50.0000 mg | ORAL_TABLET | Freq: Two times a day (BID) | ORAL | 3 refills | Status: DC

## 2021-10-12 ENCOUNTER — Ambulatory Visit: Payer: Medicare HMO | Admitting: Internal Medicine

## 2021-10-20 ENCOUNTER — Ambulatory Visit (INDEPENDENT_AMBULATORY_CARE_PROVIDER_SITE_OTHER): Payer: Medicare HMO | Admitting: *Deleted

## 2021-10-20 DIAGNOSIS — Z5181 Encounter for therapeutic drug level monitoring: Secondary | ICD-10-CM

## 2021-10-20 DIAGNOSIS — I4891 Unspecified atrial fibrillation: Secondary | ICD-10-CM

## 2021-10-20 LAB — POCT INR: INR: 2 (ref 2.0–3.0)

## 2021-10-20 NOTE — Patient Instructions (Signed)
Continue warfarin 1 tablet daily  Continue greens/salad    Repeat INR in 6 weeks.  

## 2021-11-02 ENCOUNTER — Other Ambulatory Visit (HOSPITAL_COMMUNITY): Payer: Self-pay | Admitting: Internal Medicine

## 2021-11-02 ENCOUNTER — Other Ambulatory Visit: Payer: Self-pay | Admitting: Internal Medicine

## 2021-11-02 DIAGNOSIS — M79606 Pain in leg, unspecified: Secondary | ICD-10-CM

## 2021-11-09 ENCOUNTER — Encounter: Payer: Self-pay | Admitting: Internal Medicine

## 2021-11-14 ENCOUNTER — Ambulatory Visit (HOSPITAL_COMMUNITY)
Admission: RE | Admit: 2021-11-14 | Discharge: 2021-11-14 | Disposition: A | Discharge status: Home / Self Care | Payer: Medicare HMO | Source: Ambulatory Visit | Attending: Internal Medicine | Admitting: Internal Medicine

## 2021-11-14 ENCOUNTER — Other Ambulatory Visit (HOSPITAL_COMMUNITY): Payer: Self-pay | Admitting: Internal Medicine

## 2021-11-14 DIAGNOSIS — M79606 Pain in leg, unspecified: Secondary | ICD-10-CM | POA: Insufficient documentation

## 2021-11-22 ENCOUNTER — Ambulatory Visit (HOSPITAL_COMMUNITY)
Admission: RE | Admit: 2021-11-22 | Discharge: 2021-11-22 | Disposition: A | Discharge status: Home / Self Care | Payer: Medicare HMO | Source: Ambulatory Visit | Attending: Nurse Practitioner | Admitting: Nurse Practitioner

## 2021-11-22 ENCOUNTER — Encounter (HOSPITAL_COMMUNITY): Payer: Self-pay | Admitting: Nurse Practitioner

## 2021-11-22 VITALS — BP 168/66 | HR 65 | Ht 75.0 in | Wt 214.4 lb

## 2021-11-22 DIAGNOSIS — I440 Atrioventricular block, first degree: Secondary | ICD-10-CM | POA: Insufficient documentation

## 2021-11-22 DIAGNOSIS — I10 Essential (primary) hypertension: Secondary | ICD-10-CM | POA: Diagnosis not present

## 2021-11-22 DIAGNOSIS — I480 Paroxysmal atrial fibrillation: Secondary | ICD-10-CM

## 2021-11-22 DIAGNOSIS — Z7901 Long term (current) use of anticoagulants: Secondary | ICD-10-CM | POA: Diagnosis not present

## 2021-11-22 DIAGNOSIS — R609 Edema, unspecified: Secondary | ICD-10-CM | POA: Insufficient documentation

## 2021-11-22 DIAGNOSIS — I4891 Unspecified atrial fibrillation: Secondary | ICD-10-CM

## 2021-11-22 DIAGNOSIS — Z79899 Other long term (current) drug therapy: Secondary | ICD-10-CM | POA: Insufficient documentation

## 2021-11-22 DIAGNOSIS — D6869 Other thrombophilia: Secondary | ICD-10-CM

## 2021-11-22 LAB — BASIC METABOLIC PANEL
Anion gap: 7 (ref 5–15)
BUN: 28 mg/dL — ABNORMAL HIGH (ref 8–23)
CO2: 27 mmol/L (ref 22–32)
Calcium: 9.1 mg/dL (ref 8.9–10.3)
Chloride: 105 mmol/L (ref 98–111)
Creatinine, Ser: 1.24 mg/dL (ref 0.61–1.24)
GFR, Estimated: 58 mL/min — ABNORMAL LOW (ref >60)
Glucose, Bld: 185 mg/dL — ABNORMAL HIGH (ref 70–99)
Potassium: 4.2 mmol/L (ref 3.5–5.1)
Sodium: 139 mmol/L (ref 135–145)

## 2021-11-22 LAB — CBC
HCT: 37.2 % — ABNORMAL LOW (ref 39.0–52.0)
Hemoglobin: 12.1 g/dL — ABNORMAL LOW (ref 13.0–17.0)
MCH: 30.6 pg (ref 26.0–34.0)
MCHC: 32.5 g/dL (ref 30.0–36.0)
MCV: 93.9 fL (ref 80.0–100.0)
Platelets: 130 10*3/uL — ABNORMAL LOW (ref 150–400)
RBC: 3.96 MIL/uL — ABNORMAL LOW (ref 4.22–5.81)
RDW: 12.5 % (ref 11.5–15.5)
WBC: 5.3 10*3/uL (ref 4.0–10.5)
nRBC: 0 % (ref 0.0–0.2)

## 2021-11-22 NOTE — Progress Notes (Addendum)
Patient ID: Jason Alvarez, male   DOB: [birth year], 81 y.o.   MRN: [id number] ? ? ? ? ?Primary Care Physician: Redmond School, MD ?Referring Physician: Dr. Rayann Heman ? ? ?Jason Alvarez is a 81 y.o. male with a h/o PAF on flecainide. He reports that he had a mechanical fall one month ago  but did not suffer any significant injury, SInce then he feels that he may have had more fluttering but it is very difficult for him to  describe.Marland Kitchen   His blood pressure is also elevated but on recheck 140 /50. He shows me his BP readings many of which are showing systolics of 086-578. He was on this dose in the past but he lowered as he felt BP readings were getting too low. He tells me that he was started on a baby asa a few months ago by his PCP, he did not know why. I see in Care everywhere that he was seen in October for dizziness by Neurology at [facility] and was found to have vestibular stenosis so may be 2/2 to this. He feels he is bruising more with also being on warfarin, CHA2DS2VASc score of at least 4.  He also admits that his blood glucose not too long ago got up to 15. He made some dietary changes and brought  It down to 7 but has slipped again in his dietary control.  ? ?F/u in afib clinic, 11/17/20. He reports low afib burden. His 1st degree AV block has progressed to 272 ms so will reduce flecainide to 50 mg bid. Otherwise, no complaints today.  ? ?F/u in afib clinic, 11/23/20. He wanted to be seen as he noted some increase in fluttering for a few days after reducing flecainide dose but nothing sustained. He has not noted any afib for the last 2 days. His PR has reduced in interval length to 244 ms from 272 ms. We discussed options but for now he wants to continue with flecainide at 50 mg bid.  ? ?F/u in afib clinic, 05/24/21. He is having some breakthrough afib but is still happy with current treatment and does not want to change AAD. He is in sinus rhythm today. He continues on flecainide. He has noted some ankle edema x 2  days. He continues on warfarin for a CHA2DS2VASc  score of at least 4. BP is elevated here but I reviewed BP's at home and BP's are well managed.    ? ?F/u in afib clinic, 11/22/21. He is in afib today, rate controlled. He states that his afib burden is fairly stable. Has a breakthrough at least once a week. Does not feel that different other than some slight fluttering in his chest. He has been resistant to change AAD's in the past. Cannot increase flecainide for conduction disease. I discussed ablation, amiodarone or Tikosyn. He has a wife that is very ill and he has to care for her so is not interested in ablation or Tikosyn today.Will update echo to look for any EF change with increased afib burden.  ? ?Today, negative for  chest pain, shortness of breath, orthopnea, PND, lower extremity edema, dizziness, presyncope, syncope, or neurologic sequela. The patient is tolerating medications without difficulties and is otherwise without complaint today.  ? ?Past Medical History:  ?Diagnosis Date  ? A-fib ([place])   ? paf with NSVT on MCOT, on coumadin, echo 01/31/08-nl lv, mild aortic valve sclerosis   ? Aneurysm ([place])   ? Anxiety   ? Atrial fibrillation (  [place])   ? Carotid bruit   ? doppler 05/17/12-mild plaque with no diameter reduction  ? Chronic anticoagulation   ? Chronic ITP (idiopathic thrombocytopenia) ([facility]) 04/10/2012  ? Low grade thrombocytopenia consistent with chronic ITP not in need of therapy.   ? DM (diabetes mellitus) ([place])   ? Dysrhythmia   ? afib  ? H/O cardiovascular stress test 01/31/08  ? no ishcemia, ef 61%  ? History of kidney stones   ? HTN (hypertension)   ? Hypertension   ? ITP (idiopathic thrombocytopenic purpura)   ? Thrombocytopenia ([place])   ? ?Past Surgical History:  ?Procedure Laterality Date  ? CIRCUMCISION    ? COLONOSCOPY  06/25/2012  ? Procedure: COLONOSCOPY;  Surgeon: Jamesetta So, MD;  [facility];  Service: Gastroenterology;  Laterality: N/A;  ? COLONOSCOPY N/A 01/30/2017  ?  Procedure: COLONOSCOPY;  Surgeon: Aviva Signs, MD;  [facility];  Service: Gastroenterology;  Laterality: N/A;  ? KNEE ARTHROSCOPY    ? right and left knee/1992 and 2000  ? POLYPECTOMY  01/30/2017  ? Procedure: POLYPECTOMY;  Surgeon: Aviva Signs, MD;  [facility];  Service: Gastroenterology;;  colon  ? VASECTOMY    ? ? ?Current Outpatient Medications  ?Medication Sig Dispense Refill  ? ALPRAZolam (XANAX) 1 MG tablet Taking 1 tablet by mouth 4 times daily    ? amLODipine (NORVASC) 10 MG tablet Take 1 tablet (10 mg total) by mouth daily. 30 tablet 6  ? diclofenac Sodium (VOLTAREN) 1 % GEL SMARTSIG:2 Gram(s) Topical 3 Times Daily PRN    ? flecainide (TAMBOCOR) 50 MG tablet Take 1 tablet (50 mg total) by mouth 2 (two) times daily. 60 tablet 3  ? FLUAD 0.5 ML SUSY Inject as directed See admin instructions.    ? fluticasone (FLONASE) 50 MCG/ACT nasal spray Place into both nostrils daily.    ? glipiZIDE-metformin (METAGLIP) 5-500 MG per tablet Take 1 tablet by mouth 2 (two) times daily after a meal.    ? hydrochlorothiazide (HYDRODIURIL) 25 MG tablet Take 25 mg by mouth daily.    ? metoprolol succinate (TOPROL-XL) 50 MG 24 hr tablet TAKE ONE (1) TABLET EACH DAY 90 tablet 1  ? OneTouch Delica Lancets 93Z MISC     ? ONETOUCH VERIO test strip 1 each 2 (two) times daily.    ? ramipril (ALTACE) 10 MG capsule TAKE ONE CAPSULE BY MOUTH TWICE A DAY 180 capsule 2  ? warfarin (COUMADIN) 5 MG tablet TAKE 1 TO 1&1/2 TABLET DAILY AS DIRECTED 120 tablet 2  ? finasteride (PROSCAR) 5 MG tablet Take 1 tablet (5 mg total) by mouth daily. (Patient not taking: Reported on 11/22/2021) 90 tablet 3  ? tamsulosin (FLOMAX) 0.4 MG CAPS capsule Take 1 capsule (0.4 mg total) by mouth daily. (Patient not taking: Reported on 11/22/2021) 30 capsule 11  ? ?No current facility-administered medications for this encounter.  ? ? ?Allergies  ?Allergen Reactions  ? Penicillins Other (See Comments)  ?  Childhood allergy ?Has patient had a  PCN reaction causing immediate rash, facial/tongue/throat swelling, SOB or lightheadedness with hypotension: Unknown ?Has patient had a PCN reaction causing severe rash involving mucus membranes or skin necrosis: Unknown ?Has patient had a PCN reaction that required hospitalization: Unknown ?Has patient had a PCN reaction occurring within the last 10 years: Unknown ?If all of the above answers are "NO", then may proceed with Cephalosporin use.  ? ? ?Social History  ? ?Socioeconomic History  ? Marital  status: Married  ?  Spouse name: Not on file  ? Number of children: Not on file  ? Years of education: Not on file  ? Highest education level: Not on file  ?Occupational History  ? Not on file  ?Tobacco Use  ? Smoking status: Never  ? Smokeless tobacco: Never  ?Vaping Use  ? Vaping Use: Never used  ?Substance and Sexual Activity  ? Alcohol use: No  ? Drug use: No  ? Sexual activity: Not on file  ?Other Topics Concern  ? Not on file  ?Social History Narrative  ? Not on file  ? ?Social Determinants of Health  ? ?Financial Resource Strain: Not on file  ?Food Insecurity: Not on file  ?Transportation Needs: Not on file  ?Physical Activity: Not on file  ?Stress: Not on file  ?Social Connections: Not on file  ?Intimate Partner Violence: Not on file  ? ? ?Family History  ?Problem Relation Age of Onset  ? Prostate cancer Father   ? Cancer Sister   ?     breast  ? Cancer Brother   ?     lung  ? Melanoma Mother   ? ? ?ROS- All systems are reviewed and negative except as per the HPI above ? ?Physical Exam: ?Vitals:  ? 11/22/21 0828  ?Height: '6\' 3"'$  (1.905 m)  ? ? ?GEN- The patient is well appearing, alert and oriented x 3 today.   ?Head- normocephalic, atraumatic ?Eyes-  Sclera clear, conjunctiva pink ?Ears- hearing intact ?Oropharynx- clear ?Neck- supple, no JVP ?Lymph- no cervical lymphadenopathy ?Lungs- Clear to ausculation bilaterally, normal work of breathing ?Heart- Regular rate and rhythm, no murmurs, rubs or gallops, PMI not  laterally displaced ?Extremities- no clubbing, cyanosis, or edema ?MS- no significant deformity or atrophy ?Skin- no rash or lesion ?Psych- euthymic mood, full affect ?Neuro- strength and sensation are

## 2021-12-01 ENCOUNTER — Ambulatory Visit: Payer: Medicare HMO | Admitting: Internal Medicine

## 2021-12-01 ENCOUNTER — Ambulatory Visit (INDEPENDENT_AMBULATORY_CARE_PROVIDER_SITE_OTHER): Payer: Medicare HMO | Admitting: *Deleted

## 2021-12-01 ENCOUNTER — Encounter: Payer: Self-pay | Admitting: Internal Medicine

## 2021-12-01 VITALS — BP 130/62 | HR 75 | Temp 97.3°F | Ht 74.0 in | Wt 217.6 lb

## 2021-12-01 DIAGNOSIS — R151 Fecal smearing: Secondary | ICD-10-CM

## 2021-12-01 DIAGNOSIS — R197 Diarrhea, unspecified: Secondary | ICD-10-CM | POA: Diagnosis not present

## 2021-12-01 DIAGNOSIS — Z5181 Encounter for therapeutic drug level monitoring: Secondary | ICD-10-CM | POA: Diagnosis not present

## 2021-12-01 DIAGNOSIS — I4891 Unspecified atrial fibrillation: Secondary | ICD-10-CM | POA: Diagnosis not present

## 2021-12-01 LAB — POCT INR: INR: 2.1 (ref 2.0–3.0)

## 2021-12-01 NOTE — Patient Instructions (Signed)
Continue warfarin 1 tablet daily  Continue greens/salad    Repeat INR in 6 weeks.  

## 2021-12-01 NOTE — Patient Instructions (Signed)
Etiology of your symptoms likely due to decreased anal sphincter tone.  I am going to print you out Kegel exercises to perform at home to strengthen your anal sphincter.  Also want you to start taking Metamucil over-the-counter daily.  This will help bulk up your stool.  Continue Lomotil as needed for diarrhea.  Follow-up in 6 months or sooner if needed.  It was great seeing you again today.  Dr. Abbey Chatters

## 2021-12-01 NOTE — Progress Notes (Signed)
Primary Care Physician:  Redmond School, MD Primary Gastroenterologist:  Dr. Abbey Chatters  Chief Complaint  Patient presents with   New Patient (Initial Visit)    Bowel incontinence    HPI:   Jason Alvarez is a 81 y.o. male who presents to the clinic today for follow up visit.    Last seen April 2022 for fecal smearing/incontinence.  We will schedule for flexible sigmoidoscopy to further evaluate but patient canceled as his symptoms got better.  Presents today with similar issues.  Notes occasional fecal leakage.  Also intermittent diarrhea but states his bowels have been normal for 2 to 3 weeks now.  Takes as needed Lomotil on bad days.  Previously evaluated by surgery who recommended conservative measures.  Denies any melena, does note scant hematochezia on toilet paper at times.  Last colonoscopy 2018 with 1 small tubular adenoma removed.  Did not mention internal hemorrhoids on this report.    Past Medical History:  Diagnosis Date   A-fib ([place])    paf with NSVT on MCOT, on coumadin, echo 01/31/08-nl lv, mild aortic valve sclerosis    Aneurysm ([facility])    Anxiety    Atrial fibrillation ([facility])    Carotid bruit    doppler 05/17/12-mild plaque with no diameter reduction   Chronic anticoagulation    Chronic ITP (idiopathic thrombocytopenia) ([facility]) 04/10/2012   Low grade thrombocytopenia consistent with chronic ITP not in need of therapy.    DM (diabetes mellitus) ([place])    Dysrhythmia    afib   H/O cardiovascular stress test 01/31/08   no ishcemia, ef 61%   History of kidney stones    HTN (hypertension)    Hypertension    ITP (idiopathic thrombocytopenic purpura)    Thrombocytopenia ([place])     Past Surgical History:  Procedure Laterality Date   CIRCUMCISION     COLONOSCOPY  06/25/2012   Procedure: COLONOSCOPY;  Surgeon: Jamesetta So, MD;  [facility];  Service: Gastroenterology;  Laterality: N/A;   COLONOSCOPY N/A 01/30/2017   Procedure: COLONOSCOPY;  Surgeon:  Aviva Signs, MD;  [facility];  Service: Gastroenterology;  Laterality: N/A;   KNEE ARTHROSCOPY     right and left knee/1992 and 2000   POLYPECTOMY  01/30/2017   Procedure: POLYPECTOMY;  Surgeon: Aviva Signs, MD;  [facility];  Service: Gastroenterology;;  colon   VASECTOMY      Current Outpatient Medications  Medication Sig Dispense Refill   ALPRAZolam (XANAX) 1 MG tablet Taking 1 tablet by mouth 4 times daily     amLODipine (NORVASC) 10 MG tablet Take 1 tablet (10 mg total) by mouth daily. 30 tablet 6   diclofenac Sodium (VOLTAREN) 1 % GEL SMARTSIG:2 Gram(s) Topical 3 Times Daily PRN     flecainide (TAMBOCOR) 50 MG tablet Take 1 tablet (50 mg total) by mouth 2 (two) times daily. 60 tablet 3   fluticasone (FLONASE) 50 MCG/ACT nasal spray Place into both nostrils daily.     glipiZIDE-metformin (METAGLIP) 5-500 MG per tablet Take 1 tablet by mouth 2 (two) times daily after a meal.     hydrochlorothiazide (HYDRODIURIL) 25 MG tablet Take 25 mg by mouth daily.     metoprolol succinate (TOPROL-XL) 50 MG 24 hr tablet TAKE ONE (1) TABLET EACH DAY 90 tablet 1   OneTouch Delica Lancets 44R MISC      ONETOUCH VERIO test strip 1 each 2 (two) times daily.     ramipril (ALTACE) 10  MG capsule TAKE ONE CAPSULE BY MOUTH TWICE A DAY 180 capsule 2   warfarin (COUMADIN) 5 MG tablet TAKE 1 TO 1&1/2 TABLET DAILY AS DIRECTED 120 tablet 2   finasteride (PROSCAR) 5 MG tablet Take 1 tablet (5 mg total) by mouth daily. (Patient not taking: Reported on 11/22/2021) 90 tablet 3   FLUAD 0.5 ML SUSY Inject as directed See admin instructions. (Patient not taking: Reported on 12/01/2021)     tamsulosin (FLOMAX) 0.4 MG CAPS capsule Take 1 capsule (0.4 mg total) by mouth daily. (Patient not taking: Reported on 11/22/2021) 30 capsule 11   No current facility-administered medications for this visit.    Allergies as of 12/01/2021 - Review Complete 12/01/2021  Allergen Reaction Noted   Penicillins  Other (See Comments)     Family History  Problem Relation Age of Onset   Prostate cancer Father    Cancer Sister        breast   Cancer Brother        lung   Melanoma Mother     Social History   Socioeconomic History   Marital status: Married    Spouse name: Not on file   Number of children: Not on file   Years of education: Not on file   Highest education level: Not on file  Occupational History   Not on file  Tobacco Use   Smoking status: Never   Smokeless tobacco: Never  Vaping Use   Vaping Use: Never used  Substance and Sexual Activity   Alcohol use: No   Drug use: No   Sexual activity: Not on file  Other Topics Concern   Not on file  Social History Narrative   Not on file   Social Determinants of Health   Financial Resource Strain: Not on file  Food Insecurity: Not on file  Transportation Needs: Not on file  Physical Activity: Not on file  Stress: Not on file  Social Connections: Not on file  Intimate Partner Violence: Not on file    Subjective: Review of Systems  Constitutional:  Negative for chills and fever.  HENT:  Negative for congestion and hearing loss.   Eyes:  Negative for blurred vision and double vision.  Respiratory:  Negative for cough and shortness of breath.   Cardiovascular:  Negative for chest pain and palpitations.  Gastrointestinal:  Positive for diarrhea. Negative for abdominal pain, blood in stool, constipation, heartburn, melena and vomiting.       Stool incontinence  Genitourinary:  Negative for dysuria and urgency.  Musculoskeletal:  Negative for joint pain and myalgias.  Skin:  Negative for itching and rash.  Neurological:  Negative for dizziness and headaches.  Psychiatric/Behavioral:  Negative for depression. The patient is not nervous/anxious.       Objective: BP 130/62   Pulse 75   Temp (!) 97.3 F (36.3 C)   Ht '6\' 2"'$  (1.88 m)   Wt 217 lb 9.6 oz (98.7 kg)   BMI 27.94 kg/m  Physical Exam Constitutional:       Appearance: Normal appearance.  HENT:     Head: Normocephalic and atraumatic.  Eyes:     Extraocular Movements: Extraocular movements intact.     Conjunctiva/sclera: Conjunctivae normal.  Cardiovascular:     Rate and Rhythm: Normal rate and regular rhythm.  Pulmonary:     Effort: Pulmonary effort is normal.     Breath sounds: Normal breath sounds.  Abdominal:     General: Bowel sounds are normal.  Palpations: Abdomen is soft.  Musculoskeletal:        General: Normal range of motion.     Cervical back: Normal range of motion and neck supple.  Skin:    General: Skin is warm.  Neurological:     General: No focal deficit present.     Mental Status: He is alert and oriented to person, place, and time.  Psychiatric:        Mood and Affect: Mood normal.        Behavior: Behavior normal.     Assessment: *Stool incontinence/leakage *Diarrhea-intermittent  Plan: Etiology of patient's symptoms likely due to poor sphincter tone.  Internal hemorrhoids might also be playing a role as well.  Recommended adding Metamucil to diet.  Did not do this previously.   I have also printed off Kegal exercises for her to perform at home to strengthen his pelvic muscles.  Discussed that internal hemorrhoids could be contributing to his leakage especially if he is having bright red blood per rectum with bowel movements.  Discussed potential hemorrhoid banding with him.  This will require flexible sigmoidoscopy to further evaluate prior.  Would like to hold off for now.  Continue Lomotil as needed for diarrhea.  Follow-up in 6 months or sooner if needed.  12/01/2021 8:45 AM   Disclaimer: This note was dictated with voice recognition software. Similar sounding words can inadvertently be transcribed and may not be corrected upon review.

## 2021-12-05 ENCOUNTER — Ambulatory Visit (HOSPITAL_COMMUNITY)
Admission: RE | Admit: 2021-12-05 | Discharge: 2021-12-05 | Disposition: A | Discharge status: Home / Self Care | Payer: Medicare HMO | Source: Ambulatory Visit | Attending: Nurse Practitioner | Admitting: Nurse Practitioner

## 2021-12-05 DIAGNOSIS — I10 Essential (primary) hypertension: Secondary | ICD-10-CM | POA: Diagnosis not present

## 2021-12-05 DIAGNOSIS — I340 Nonrheumatic mitral (valve) insufficiency: Secondary | ICD-10-CM | POA: Diagnosis not present

## 2021-12-05 DIAGNOSIS — E119 Type 2 diabetes mellitus without complications: Secondary | ICD-10-CM | POA: Insufficient documentation

## 2021-12-05 DIAGNOSIS — I480 Paroxysmal atrial fibrillation: Secondary | ICD-10-CM

## 2021-12-05 LAB — ECHOCARDIOGRAM COMPLETE
AR max vel: 2.87 cm2
AV Peak grad: 9.6 mmHg
Ao pk vel: 1.55 m/s
Area-P 1/2: 5.16 cm2
S' Lateral: 4 cm

## 2021-12-08 ENCOUNTER — Ambulatory Visit: Payer: Medicare HMO | Admitting: Urology

## 2021-12-09 ENCOUNTER — Encounter (HOSPITAL_COMMUNITY): Payer: Self-pay | Admitting: *Deleted

## 2021-12-09 ENCOUNTER — Other Ambulatory Visit: Payer: Self-pay | Admitting: Urology

## 2021-12-19 ENCOUNTER — Other Ambulatory Visit (HOSPITAL_COMMUNITY): Payer: Self-pay | Admitting: Nurse Practitioner

## 2022-01-12 ENCOUNTER — Ambulatory Visit (INDEPENDENT_AMBULATORY_CARE_PROVIDER_SITE_OTHER): Payer: Medicare HMO | Admitting: *Deleted

## 2022-01-12 DIAGNOSIS — I4891 Unspecified atrial fibrillation: Secondary | ICD-10-CM | POA: Diagnosis not present

## 2022-01-12 DIAGNOSIS — Z5181 Encounter for therapeutic drug level monitoring: Secondary | ICD-10-CM | POA: Diagnosis not present

## 2022-01-12 LAB — POCT INR: INR: 2.2 (ref 2.0–3.0)

## 2022-01-12 NOTE — Patient Instructions (Signed)
Continue warfarin 1 tablet daily  Continue greens/salad    Repeat INR in 6 weeks.

## 2022-02-23 ENCOUNTER — Ambulatory Visit (INDEPENDENT_AMBULATORY_CARE_PROVIDER_SITE_OTHER): Payer: Medicare HMO | Admitting: *Deleted

## 2022-02-23 DIAGNOSIS — I4891 Unspecified atrial fibrillation: Secondary | ICD-10-CM

## 2022-02-23 DIAGNOSIS — Z5181 Encounter for therapeutic drug level monitoring: Secondary | ICD-10-CM

## 2022-02-23 LAB — POCT INR: INR: 1.9 — AB (ref 2.0–3.0)

## 2022-02-23 NOTE — Patient Instructions (Signed)
Increase warfarin to 1 tablet daily except 1 1/2 tablets on Thursdays Continue greens/salad    Repeat INR in 6 weeks.

## 2022-03-01 ENCOUNTER — Ambulatory Visit (HOSPITAL_COMMUNITY)
Admission: RE | Admit: 2022-03-01 | Discharge: 2022-03-01 | Disposition: A | Discharge status: Home / Self Care | Payer: Medicare HMO | Source: Ambulatory Visit | Attending: Nurse Practitioner | Admitting: Nurse Practitioner

## 2022-03-01 ENCOUNTER — Other Ambulatory Visit (HOSPITAL_COMMUNITY): Payer: Self-pay | Admitting: Nurse Practitioner

## 2022-03-01 VITALS — BP 168/66 | HR 69 | Ht 74.0 in | Wt 213.6 lb

## 2022-03-01 DIAGNOSIS — I480 Paroxysmal atrial fibrillation: Secondary | ICD-10-CM

## 2022-03-01 DIAGNOSIS — Z79899 Other long term (current) drug therapy: Secondary | ICD-10-CM | POA: Insufficient documentation

## 2022-03-01 DIAGNOSIS — I10 Essential (primary) hypertension: Secondary | ICD-10-CM | POA: Diagnosis not present

## 2022-03-01 DIAGNOSIS — I440 Atrioventricular block, first degree: Secondary | ICD-10-CM | POA: Diagnosis not present

## 2022-03-01 DIAGNOSIS — E119 Type 2 diabetes mellitus without complications: Secondary | ICD-10-CM | POA: Insufficient documentation

## 2022-03-01 DIAGNOSIS — Z7901 Long term (current) use of anticoagulants: Secondary | ICD-10-CM | POA: Insufficient documentation

## 2022-03-01 DIAGNOSIS — D693 Immune thrombocytopenic purpura: Secondary | ICD-10-CM | POA: Diagnosis not present

## 2022-03-01 DIAGNOSIS — D6869 Other thrombophilia: Secondary | ICD-10-CM

## 2022-03-01 DIAGNOSIS — Z7984 Long term (current) use of oral hypoglycemic drugs: Secondary | ICD-10-CM | POA: Insufficient documentation

## 2022-03-01 NOTE — Progress Notes (Signed)
Patient ID: Jason Alvarez, male   DOB: 1941-03-06, 81 y.o.   MRN: [id number]     Primary Care Physician: Redmond School, MD Referring Physician: Dr. Charlyne Petrin is a 81 y.o. male with a h/o PAF on flecainide. He reports that he had a mechanical fall one month ago  but did not suffer any significant injury, SInce then he feels that he may have had more fluttering but it is very difficult for him to  describe.Marland Kitchen   His blood pressure is also elevated but on recheck 140 /50. He shows me his BP readings many of which are showing systolics of 841-660. He was on this dose in the past but he lowered as he felt BP readings were getting too low. He tells me that he was started on a baby asa a few months ago by his PCP, he did not know why. I see in Care everywhere that he was seen in October for dizziness by Neurology at [facility] and was found to have vestibular stenosis so may be 2/2 to this. He feels he is bruising more with also being on warfarin, CHA2DS2VASc score of at least 4.  He also admits that his blood glucose not too long ago got up to 15. He made some dietary changes and brought  It down to 7 but has slipped again in his dietary control.   F/u in afib clinic, 11/17/20. He reports low afib burden. His 1st degree AV block has progressed to 272 ms so will reduce flecainide to 50 mg bid. Otherwise, no complaints today.   F/u in afib clinic, 11/23/20. He wanted to be seen as he noted some increase in fluttering for a few days after reducing flecainide dose but nothing sustained. He has not noted any afib for the last 2 days. His PR has reduced in interval length to 244 ms from 272 ms. We discussed options but for now he wants to continue with flecainide at 50 mg bid.   F/u in afib clinic, 05/24/21. He is having some breakthrough afib but is still happy with current treatment and does not want to change AAD. He is in sinus rhythm today. He continues on flecainide. He has noted some ankle edema x 2  days. He continues on warfarin for a CHA2DS2VASc  score of at least 4. BP is elevated here but I reviewed BP's at home and BP's are well managed.     F/u in afib clinic, 11/22/21. He is in afib today, rate controlled. He states that his afib burden is fairly stable. Has a breakthrough at least once a week. Does not feel that different other than some slight fluttering in his chest. He has been resistant to change AAD's in the past. Cannot increase flecainide for conduction disease. I discussed ablation, amiodarone or Tikosyn. He has a wife that is very ill and he has to care for her so is not interested in ablation or Tikosyn today.Will update echo to look for any EF change with increased afib burden.   F/u afib clinic, 03/01/22.he is in rate controlled afib today. He cant tell his afib burden. We discussed options today, amio/tikosyn/ablation. He has a wife he cares for and has everything on him. So he is not in favor of ablation or tikosyn. He cannot go up on flecainide as it had to be lowered  in the past due to worsening first degree block. He was not aware he was in afib this am. Sometimes  can feel some fluttering. I placed a monitor on him last August and he had 8% afib burden and he chose to continue flecainide.   Today, negative for  chest pain, shortness of breath, orthopnea, PND, lower extremity edema, dizziness, presyncope, syncope, or neurologic sequela. The patient is tolerating medications without difficulties and is otherwise without complaint today.   Past Medical History:  Diagnosis Date   A-fib ([place])    paf with NSVT on MCOT, on coumadin, echo 01/31/08-nl lv, mild aortic valve sclerosis    Aneurysm ([facility])    Anxiety    Atrial fibrillation ([facility])    Carotid bruit    doppler 05/17/12-mild plaque with no diameter reduction   Chronic anticoagulation    Chronic ITP (idiopathic thrombocytopenia) ([facility]) 04/10/2012   Low grade thrombocytopenia consistent with chronic ITP not in need of therapy.     DM (diabetes mellitus) ([place])    Dysrhythmia    afib   H/O cardiovascular stress test 01/31/08   no ishcemia, ef 61%   History of kidney stones    HTN (hypertension)    Hypertension    ITP (idiopathic thrombocytopenic purpura)    Thrombocytopenia ([place])    Past Surgical History:  Procedure Laterality Date   CIRCUMCISION     COLONOSCOPY  06/25/2012   Procedure: COLONOSCOPY;  Surgeon: Jamesetta So, MD;  [facility];  Service: Gastroenterology;  Laterality: N/A;   COLONOSCOPY N/A 01/30/2017   Procedure: COLONOSCOPY;  Surgeon: Aviva Signs, MD;  [facility];  Service: Gastroenterology;  Laterality: N/A;   KNEE ARTHROSCOPY     right and left knee/1992 and 2000   POLYPECTOMY  01/30/2017   Procedure: POLYPECTOMY;  Surgeon: Aviva Signs, MD;  [facility];  Service: Gastroenterology;;  colon   VASECTOMY      Current Outpatient Medications  Medication Sig Dispense Refill   ALPRAZolam (XANAX) 1 MG tablet Taking 1 tablet by mouth 4 times daily     amLODipine (NORVASC) 10 MG tablet Take 1 tablet (10 mg total) by mouth daily. 30 tablet 6   diclofenac Sodium (VOLTAREN) 1 % GEL SMARTSIG:2 Gram(s) Topical 3 Times Daily PRN     finasteride (PROSCAR) 5 MG tablet TAKE ONE (1) TABLET BY MOUTH EVERY DAY 90 tablet 3   flecainide (TAMBOCOR) 50 MG tablet TAKE ONE (1) TABLET BY MOUTH TWO (2) TIMES DAILY 60 tablet 9   fluticasone (FLONASE) 50 MCG/ACT nasal spray Place into both nostrils daily.     glipiZIDE-metformin (METAGLIP) 5-500 MG per tablet Take 1 tablet by mouth 2 (two) times daily after a meal.     hydrochlorothiazide (HYDRODIURIL) 25 MG tablet Take 25 mg by mouth daily.     metoprolol succinate (TOPROL-XL) 50 MG 24 hr tablet TAKE ONE (1) TABLET EACH DAY 90 tablet 1   OneTouch Delica Lancets 35W MISC      ONETOUCH VERIO test strip 1 each 2 (two) times daily.     ramipril (ALTACE) 10 MG capsule TAKE ONE CAPSULE BY MOUTH TWICE A DAY 180 capsule 2   warfarin  (COUMADIN) 5 MG tablet TAKE 1 TO 1&1/2 TABLET DAILY AS DIRECTED 120 tablet 2   tamsulosin (FLOMAX) 0.4 MG CAPS capsule Take 1 capsule (0.4 mg total) by mouth daily. (Patient not taking: Reported on 03/01/2022) 30 capsule 11   No current facility-administered medications for this encounter.    Allergies  Allergen Reactions   Penicillins Other (See Comments)    Childhood allergy Has patient had  a PCN reaction causing immediate rash, facial/tongue/throat swelling, SOB or lightheadedness with hypotension: Unknown Has patient had a PCN reaction causing severe rash involving mucus membranes or skin necrosis: Unknown Has patient had a PCN reaction that required hospitalization: Unknown Has patient had a PCN reaction occurring within the last 10 years: Unknown If all of the above answers are "NO", then may proceed with Cephalosporin use.    Social History   Socioeconomic History   Marital status: Married    Spouse name: Not on file   Number of children: Not on file   Years of education: Not on file   Highest education level: Not on file  Occupational History   Not on file  Tobacco Use   Smoking status: Never   Smokeless tobacco: Never  Vaping Use   Vaping Use: Never used  Substance and Sexual Activity   Alcohol use: No   Drug use: No   Sexual activity: Not on file  Other Topics Concern   Not on file  Social History Narrative   Not on file   Social Determinants of Health   Financial Resource Strain: Not on file  Food Insecurity: Not on file  Transportation Needs: Not on file  Physical Activity: Not on file  Stress: Not on file  Social Connections: Not on file  Intimate Partner Violence: Not on file    Family History  Problem Relation Age of Onset   Prostate cancer Father    Cancer Sister        breast   Cancer Brother        lung   Melanoma Mother     ROS- All systems are reviewed and negative except as per the HPI above  Physical Exam: Vitals:   03/01/22 0841   BP: (!) 168/66  Pulse: 69  Weight: 96.9 kg  Height: '6\' 2"'$  (1.88 m)    GEN- The patient is well appearing, alert and oriented x 3 today.   Head- normocephalic, atraumatic Eyes-  Sclera clear, conjunctiva pink Ears- hearing intact Oropharynx- clear Neck- supple, no JVP Lymph- no cervical lymphadenopathy Lungs- Clear to ausculation bilaterally, normal work of breathing Heart- Regular rate and rhythm, no murmurs, rubs or gallops, PMI not laterally displaced Extremities- no clubbing, cyanosis, or edema MS- no significant deformity or atrophy Skin- no rash or lesion Psych- euthymic mood, full affect Neuro- strength and sensation are intact  Ekg-Vent. rate 69 BPM PR interval * ms QRS duration 108 ms QT/QTcB 424/454 ms P-R-T axes * 59 70 Atrial fibrillation Abnormal ECG When compared with ECG of 22-Nov-2021 08:40, PREVIOUS ECG IS PRESENT  Echo-1. Left ventricular ejection fraction, by estimation, is 60 to 65%. The  left ventricle has normal function. The left ventricle has no regional  wall motion abnormalities. The left ventricular internal cavity size was  mildly dilated. Left ventricular  diastolic function could not be evaluated.   2. Right ventricular systolic function is normal. The right ventricular  size is normal.   3. Right atrial size was mildly dilated.   4. The mitral valve is normal in structure. Mild mitral valve  regurgitation. No evidence of mitral stenosis.   5. The aortic valve is tricuspid. Aortic valve regurgitation is not  visualized. Aortic valve sclerosis/calcification is present, without any  evidence of aortic stenosis.   6. The inferior vena cava is normal in size with greater than 50%  respiratory variability, suggesting right atrial pressure of 3 mmHg.   Assessment and Plan: 1. Paroxysmal atrial  fibrillation  Decreased flecainide 100 mg bid to 50 mg bid for progression of first degree AV block He is having some breakthrough afib on lower dose  but he is not wanting to switch to Tikosyn or amiodarone at this point nor consider ablation   I will place a 2 week zio patch monitor to see if afib burden has increased form last year and if it is worthwhile to even continue flecainide Continue metoprolol 50 mg daily, cannot increase for bradycardia at baseline   2.HTN Elevated here but pt states stable at home  3. Chadsvasc score of at least 4 Continue warfarin, followed by [facility] office     F/u in 6 months but I will call with monitor  results and will discuss options again at that time    Butch Penny C. Salinda Snedeker, [facility] [address] [place], [place] [address] [phone]

## 2022-03-08 ENCOUNTER — Other Ambulatory Visit (HOSPITAL_COMMUNITY): Payer: Self-pay | Admitting: Nurse Practitioner

## 2022-03-16 ENCOUNTER — Ambulatory Visit: Payer: Medicare HMO | Admitting: Urology

## 2022-03-16 ENCOUNTER — Encounter: Payer: Self-pay | Admitting: Urology

## 2022-03-16 VITALS — BP 144/70 | HR 53 | Wt 214.0 lb

## 2022-03-16 DIAGNOSIS — R351 Nocturia: Secondary | ICD-10-CM | POA: Diagnosis not present

## 2022-03-16 DIAGNOSIS — N401 Enlarged prostate with lower urinary tract symptoms: Secondary | ICD-10-CM | POA: Diagnosis not present

## 2022-03-16 DIAGNOSIS — R3129 Other microscopic hematuria: Secondary | ICD-10-CM

## 2022-03-16 DIAGNOSIS — N138 Other obstructive and reflux uropathy: Secondary | ICD-10-CM

## 2022-03-16 LAB — MICROSCOPIC EXAMINATION
Bacteria, UA: NONE SEEN
Epithelial Cells (non renal): NONE SEEN /hpf (ref 0–10)
Renal Epithel, UA: NONE SEEN /hpf
WBC, UA: NONE SEEN /hpf (ref 0–5)

## 2022-03-16 LAB — URINALYSIS, ROUTINE W REFLEX MICROSCOPIC
Bilirubin, UA: NEGATIVE
Ketones, UA: NEGATIVE
Leukocytes,UA: NEGATIVE
Nitrite, UA: NEGATIVE
Specific Gravity, UA: 1.02 (ref 1.005–1.030)
Urobilinogen, Ur: 0.2 mg/dL (ref 0.2–1.0)
pH, UA: 6 (ref 5.0–7.5)

## 2022-03-16 NOTE — Progress Notes (Signed)
Subjective:  1. BPH with urinary obstruction   2. Nocturia   3. Microhematuria     03/16/22: Timmothy Sours returns today in f/u.  He had a hematuria w/u last year and was found to have tiny renal stones and prostate enlargement.  He has still not tried the finasteride or tamsulosin.  He has had some increased frequency over the last week.  His IPSS today is 20.  He has a reduced stream.  His UA today has 3-10 RBC's.  His last PSA was 7.5 in 2/23 which was stable.  He has 1+ glucose but is off of farxiga.   03/17/21: Mr. Helle returns today in f/u from a CT hematuria study for microhematuria noted in April.  He was found to have punctate renal stones and a large prostate but no other significant GU findings.   He was given tamsulosin at his last visit but he has never started that.  He has previously declined finasteride.   He has had a lot on his plate dealing with his wife.  His UA still has 3-10 RBC's.  His IPSS is stable at 17 with a weak stream and a sensation of incomplete emptying.  He has been started on Farxiga and has 2+ glucose.  His Cr was 1.1.     11/04/20: Mr. Kerstein returns today in f/u for his several year history of BPH with BOO and an elevated PSA. His PSA is 7.5 which is fairly stable.  It was 6.1 in 11/26/18.  It is 8.6 with a 34% f/t ratio on 12/04/19 which is in his usual range.  He had a prostate MRI in the fall of 2017 that was negative but his prostate was very large. He was given a script for finasteride but he never started it.  His IPSS is 17 which is stable.  He has an episode of enuresis recently but that hasn't recurred.  He has no associated signs or symptoms.  Cr is 1.27 on 09/13/20.   Kimi has had occasional recurrent balanitis but it has responded to nystatin and hasn't had to use it recently.    IPSS     Row Name 03/16/22 0900         International Prostate Symptom Score   How often have you had the sensation of not emptying your bladder? Less than 1 in 5     How often  have you had to urinate less than every two hours? Almost always     How often have you found you stopped and started again several times when you urinated? About half the time     How often have you found it difficult to postpone urination? About half the time     How often have you had a weak urinary stream? Almost always     How often have you had to strain to start urination? Not at All     How many times did you typically get up at night to urinate? 3 Times     Total IPSS Score 20       Quality of Life due to urinary symptoms   If you were to spend the rest of your life with your urinary condition just the way it is now how would you feel about that? Pleased                   ROS:  ROS:  A complete review of systems was performed.  All systems are negative except for  pertinent findings as noted.   Review of Systems  HENT:  Positive for congestion.   Cardiovascular:  Positive for leg swelling.  Endo/Heme/Allergies:  Bruises/bleeds easily.  All other systems reviewed and are negative.   Allergies  Allergen Reactions   Penicillins Other (See Comments)    Childhood allergy Has patient had a PCN reaction causing immediate rash, facial/tongue/throat swelling, SOB or lightheadedness with hypotension: Unknown Has patient had a PCN reaction causing severe rash involving mucus membranes or skin necrosis: Unknown Has patient had a PCN reaction that required hospitalization: Unknown Has patient had a PCN reaction occurring within the last 10 years: Unknown If all of the above answers are "NO", then may proceed with Cephalosporin use.    Outpatient Encounter Medications as of 03/16/2022  Medication Sig Note   ALPRAZolam (XANAX) 1 MG tablet Taking 1 tablet by mouth 4 times daily    amLODipine (NORVASC) 10 MG tablet Take 1 tablet (10 mg total) by mouth daily.    clotrimazole-betamethasone (LOTRISONE) cream Apply topically.    diclofenac Sodium (VOLTAREN) 1 % GEL SMARTSIG:2  Gram(s) Topical 3 Times Daily PRN    flecainide (TAMBOCOR) 50 MG tablet TAKE ONE (1) TABLET BY MOUTH TWO (2) TIMES DAILY    fluticasone (FLONASE) 50 MCG/ACT nasal spray Place into both nostrils daily. 12/01/2021: As needed   gabapentin (NEURONTIN) 300 MG capsule Take 300 mg by mouth daily.    glipiZIDE-metformin (METAGLIP) 5-500 MG per tablet Take 1 tablet by mouth 2 (two) times daily after a meal.    hydrochlorothiazide (HYDRODIURIL) 25 MG tablet Take 25 mg by mouth daily.    metoprolol succinate (TOPROL-XL) 50 MG 24 hr tablet TAKE ONE (1) TABLET EACH DAY    OneTouch Delica Lancets 00Q MISC     ONETOUCH VERIO test strip 1 each 2 (two) times daily.    ramipril (ALTACE) 10 MG capsule TAKE ONE CAPSULE BY MOUTH TWICE A DAY    warfarin (COUMADIN) 5 MG tablet TAKE 1 TO 1&1/2 TABLET DAILY AS DIRECTED    [DISCONTINUED] finasteride (PROSCAR) 5 MG tablet TAKE ONE (1) TABLET BY MOUTH EVERY DAY    [DISCONTINUED] tamsulosin (FLOMAX) 0.4 MG CAPS capsule Take 1 capsule (0.4 mg total) by mouth daily.    No facility-administered encounter medications on file as of 03/16/2022.    Past Medical History:  Diagnosis Date   A-fib ([place])    paf with NSVT on MCOT, on coumadin, echo 01/31/08-nl lv, mild aortic valve sclerosis    Aneurysm ([facility])    Anxiety    Atrial fibrillation ([facility])    Carotid bruit    doppler 05/17/12-mild plaque with no diameter reduction   Chronic anticoagulation    Chronic ITP (idiopathic thrombocytopenia) ([facility]) 04/10/2012   Low grade thrombocytopenia consistent with chronic ITP not in need of therapy.    DM (diabetes mellitus) ([place])    Dysrhythmia    afib   H/O cardiovascular stress test 01/31/08   no ishcemia, ef 61%   History of kidney stones    HTN (hypertension)    Hypertension    ITP (idiopathic thrombocytopenic purpura)    Thrombocytopenia ([place])     Past Surgical History:  Procedure Laterality Date   CIRCUMCISION     COLONOSCOPY  06/25/2012   Procedure: COLONOSCOPY;  Surgeon:  Jamesetta So, MD;  [facility];  Service: Gastroenterology;  Laterality: N/A;   COLONOSCOPY N/A 01/30/2017   Procedure: COLONOSCOPY;  Surgeon: Aviva Signs, MD;  [facility];  Service: Gastroenterology;  Laterality: N/A;   KNEE ARTHROSCOPY     right and left knee/1992 and 2000   POLYPECTOMY  01/30/2017   Procedure: POLYPECTOMY;  Surgeon: Aviva Signs, MD;  [facility];  Service: Gastroenterology;;  colon   VASECTOMY      Social History   Socioeconomic History   Marital status: Married    Spouse name: Not on file   Number of children: Not on file   Years of education: Not on file   Highest education level: Not on file  Occupational History   Not on file  Tobacco Use   Smoking status: Never   Smokeless tobacco: Never  Vaping Use   Vaping Use: Never used  Substance and Sexual Activity   Alcohol use: No   Drug use: No   Sexual activity: Not on file  Other Topics Concern   Not on file  Social History Narrative   Not on file   Social Determinants of Health   Financial Resource Strain: Not on file  Food Insecurity: Not on file  Transportation Needs: Not on file  Physical Activity: Not on file  Stress: Not on file  Social Connections: Not on file  Intimate Partner Violence: Not on file    Family History  Problem Relation Age of Onset   Prostate cancer Father    Cancer Sister        breast   Cancer Brother        lung   Melanoma Mother        Objective: Vitals:   03/16/22 0911  BP: (!) 144/70  Pulse: (!) 53     Physical Exam  Lab Results:  Results for orders placed or performed in visit on 03/16/22 (from the past 24 hour(s))  Urinalysis, Routine w reflex microscopic     Status: Abnormal   Collection Time: 03/16/22  9:19 AM  Result Value Ref Range   Specific Gravity, UA 1.020 1.005 - 1.030   pH, UA 6.0 5.0 - 7.5   Color, UA Yellow Yellow   Appearance Ur Clear Clear   Leukocytes,UA Negative Negative   Protein,UA  2+ (A) Negative/Trace   Glucose, UA 1+ (A) Negative   Ketones, UA Negative Negative   RBC, UA 1+ (A) Negative   Bilirubin, UA Negative Negative   Urobilinogen, Ur 0.2 0.2 - 1.0 mg/dL   Nitrite, UA Negative Negative   Microscopic Examination See below:    Narrative   Performed at:  [address] - [facility] [address], [place], [place]  [id number] Lab Director: Mina Marble [place], Phone:  [phone]  Microscopic Examination     Status: Abnormal   Collection Time: 03/16/22  9:19 AM   Urine  Result Value Ref Range   WBC, UA None seen 0 - 5 /hpf   RBC, Urine 3-10 (A) 0 - 2 /hpf   Epithelial Cells (non renal) None seen 0 - 10 /hpf   Renal Epithel, UA None seen None seen /hpf   Mucus, UA Present Not Estab.   Bacteria, UA None seen None seen/Few   Narrative   Performed at:  [facility] [address], [place], [place]  [id number] Lab Director: [place], Phone:  [phone]     BMET No results for input(s): "NA", "K", "CL", "CO2", "GLUCOSE", "BUN", "CREATININE", "CALCIUM" in the last 72 hours. PSA Lab Results  Component Value Date   PSA1 7.5 (H) 09/01/2021   PSA1 7.5 (H) 06/09/2020  PSA  Date Value Ref Range Status  12/04/2019 8.6  Final  11/26/2018 6.1  Final   No results found for: "TESTOSTERONE"    Results for orders placed or performed in visit on 03/16/22 (from the past 24 hour(s))  Urinalysis, Routine w reflex microscopic     Status: Abnormal   Collection Time: 03/16/22  9:19 AM  Result Value Ref Range   Specific Gravity, UA 1.020 1.005 - 1.030   pH, UA 6.0 5.0 - 7.5   Color, UA Yellow Yellow   Appearance Ur Clear Clear   Leukocytes,UA Negative Negative   Protein,UA 2+ (A) Negative/Trace   Glucose, UA 1+ (A) Negative   Ketones, UA Negative Negative   RBC, UA 1+ (A) Negative   Bilirubin, UA Negative Negative   Urobilinogen, Ur 0.2 0.2 - 1.0 mg/dL   Nitrite, UA Negative Negative   Microscopic Examination See below:    Narrative    Performed at:  [facility] [address] [place], [place]  [id number] Lab Director: Mina Marble MT, Phone:  [phone]  Microscopic Examination     Status: Abnormal   Collection Time: 03/16/22  9:19 AM   Urine  Result Value Ref Range   WBC, UA None seen 0 - 5 /hpf   RBC, Urine 3-10 (A) 0 - 2 /hpf   Epithelial Cells (non renal) None seen 0 - 10 /hpf   Renal Epithel, UA None seen None seen /hpf   Mucus, UA Present Not Estab.   Bacteria, UA None seen None seen/Few   Narrative   Performed at:  [facility] [address] [place], [place]  [id number] Lab Director: [place], Phone:  [phone]      Studies/Results: No results found.   Assessment & Plan: BPH with BOO.  He has  Stable LUTS with a history of an elevated PSA.Marland Kitchen   He never took the finasteride or the tamsulosin.   I have tried to get him on meds for the prostate but will not push the issue anymore.     Microhematuria.  The CT just shows tiny renal stones.    Cystoscopy suggesteda prostatic source of bleeding.  He has 3-10 RBC's today.   Elevated PSA.  His PSA was stable at his last visit and I don't think we need to check it anymore.   No orders of the defined types were placed in this encounter.    Orders Placed This Encounter  Procedures   Microscopic Examination   Urinalysis, Routine w reflex microscopic      Return in about 1 year (around 03/17/2023) for with PVR. Marland Kitchen   CC: Redmond School, MD      Irine Seal 03/17/2022 Patient ID: [name], male   DOB: 18-Sep-1940, 81 y.o.   MRN: [id number]

## 2022-03-17 ENCOUNTER — Other Ambulatory Visit: Payer: Self-pay | Admitting: Cardiovascular Disease

## 2022-03-23 NOTE — Addendum Note (Signed)
Encounter addended by: Juluis Mire, RN on: 03/23/2022 2:13 PM  Actions taken: Imaging Exam ended

## 2022-03-30 ENCOUNTER — Telehealth (HOSPITAL_COMMUNITY): Payer: Self-pay | Admitting: *Deleted

## 2022-03-30 NOTE — Telephone Encounter (Signed)
Pt notified. He will think over his options and will call with decision.

## 2022-03-30 NOTE — Telephone Encounter (Signed)
-----   Message from Sherran Needs, NP sent at 03/28/2022  2:25 PM EDT ----- Please let pt know that SR 70% of time, afib 30% of time. See if he wants to switch to amiodarone or Tikosyn ----- Message ----- From: Juluis Mire, RN Sent: 03/23/2022   2:14 PM EDT To: Sherran Needs, NP

## 2022-04-06 ENCOUNTER — Ambulatory Visit: Payer: Medicare HMO | Attending: Cardiovascular Disease | Admitting: *Deleted

## 2022-04-06 DIAGNOSIS — I4891 Unspecified atrial fibrillation: Secondary | ICD-10-CM | POA: Diagnosis not present

## 2022-04-06 DIAGNOSIS — Z5181 Encounter for therapeutic drug level monitoring: Secondary | ICD-10-CM | POA: Diagnosis not present

## 2022-04-06 LAB — POCT INR: INR: 2.9 (ref 2.0–3.0)

## 2022-04-06 NOTE — Patient Instructions (Signed)
Description   Continue to take 1 tablet daily except 1 1/2 tablets on Thursdays Continue greens/salad    Repeat INR in 6 weeks.

## 2022-05-17 ENCOUNTER — Encounter: Payer: Self-pay | Admitting: *Deleted

## 2022-05-18 ENCOUNTER — Ambulatory Visit: Payer: Medicare HMO | Attending: Cardiovascular Disease | Admitting: *Deleted

## 2022-05-18 DIAGNOSIS — I4891 Unspecified atrial fibrillation: Secondary | ICD-10-CM | POA: Diagnosis not present

## 2022-05-18 DIAGNOSIS — Z5181 Encounter for therapeutic drug level monitoring: Secondary | ICD-10-CM

## 2022-05-18 LAB — POCT INR: INR: 2.7 (ref 2.0–3.0)

## 2022-05-18 NOTE — Patient Instructions (Signed)
Continue to take 1 tablet daily except 1 1/2 tablets on Thursdays Continue greens/salad    Repeat INR in 6 weeks.

## 2022-05-30 ENCOUNTER — Other Ambulatory Visit (HOSPITAL_COMMUNITY): Payer: Self-pay | Admitting: Orthopedic Surgery

## 2022-05-30 DIAGNOSIS — M5126 Other intervertebral disc displacement, lumbar region: Secondary | ICD-10-CM

## 2022-05-30 DIAGNOSIS — M545 Low back pain, unspecified: Secondary | ICD-10-CM

## 2022-06-20 ENCOUNTER — Encounter (HOSPITAL_COMMUNITY): Payer: Self-pay

## 2022-06-20 ENCOUNTER — Ambulatory Visit (HOSPITAL_COMMUNITY): Payer: Medicare HMO

## 2022-06-29 ENCOUNTER — Ambulatory Visit: Payer: Medicare HMO | Attending: Cardiovascular Disease | Admitting: *Deleted

## 2022-06-29 DIAGNOSIS — Z5181 Encounter for therapeutic drug level monitoring: Secondary | ICD-10-CM | POA: Diagnosis not present

## 2022-06-29 DIAGNOSIS — I4891 Unspecified atrial fibrillation: Secondary | ICD-10-CM

## 2022-06-29 LAB — POCT INR: INR: 3.4 — AB (ref 2.0–3.0)

## 2022-06-29 NOTE — Patient Instructions (Signed)
Hold warfarin today then resume 1 tablet daily except 1 1/2 tablets on Thursdays Continue greens/salad    Repeat INR in 4 weeks.

## 2022-07-25 ENCOUNTER — Ambulatory Visit: Payer: Medicare HMO | Admitting: Internal Medicine

## 2022-07-26 ENCOUNTER — Encounter: Payer: Self-pay | Admitting: Internal Medicine

## 2022-07-26 ENCOUNTER — Ambulatory Visit (INDEPENDENT_AMBULATORY_CARE_PROVIDER_SITE_OTHER): Payer: Medicare HMO | Admitting: Internal Medicine

## 2022-07-26 VITALS — BP 153/95 | HR 53 | Temp 97.3°F | Ht 74.0 in | Wt 212.0 lb

## 2022-07-26 DIAGNOSIS — K625 Hemorrhage of anus and rectum: Secondary | ICD-10-CM | POA: Diagnosis not present

## 2022-07-26 DIAGNOSIS — R151 Fecal smearing: Secondary | ICD-10-CM

## 2022-07-26 DIAGNOSIS — I10 Essential (primary) hypertension: Secondary | ICD-10-CM

## 2022-07-26 DIAGNOSIS — R197 Diarrhea, unspecified: Secondary | ICD-10-CM | POA: Diagnosis not present

## 2022-07-26 DIAGNOSIS — R140 Abdominal distension (gaseous): Secondary | ICD-10-CM

## 2022-07-26 NOTE — Progress Notes (Addendum)
Primary Care Physician:  Redmond School, MD Primary Gastroenterologist:  Dr. Abbey Chatters  Chief Complaint  Patient presents with   Diarrhea    Patient here today with concerns of diarrhea/ loose stools, and gas. Patient also has fecal incontinence. He does not take any antidiarrheal medications. He does not use any thing for gas.     HPI:   Jason Alvarez is a 82 y.o. male who presents to the clinic today for follow up visit.    Last seen April 2022 for fecal smearing/incontinence.  We will schedule for flexible sigmoidoscopy to further evaluate but patient canceled as his symptoms got better.  Presents today with similar issues.  Notes occasional fecal leakage.  Also intermittent diarrhea but states his bowels have better as a whole.  Takes as needed Lomotil on bad days.  Previously evaluated by surgery who recommended conservative measures.  Denies any melena, does note scant hematochezia on toilet paper at times.  Last colonoscopy 2018 with 1 small tubular adenoma removed.  Did not mention internal hemorrhoids on this report.   Previously recommended to take Metamucil over-the-counter.  States he cannot tolerate the taste of this.  Took it 1 time.  Continues complain about gas, primarily postprandial.  Last colonoscopy 2018 with 1 small cecal tubular adenoma removed.   Past Medical History:  Diagnosis Date   A-fib ([place])    paf with NSVT on MCOT, on coumadin, echo 01/31/08-nl lv, mild aortic valve sclerosis    Aneurysm ([facility])    Anxiety    Atrial fibrillation (HCC)    Carotid bruit    doppler 05/17/12-mild plaque with no diameter reduction   Chronic anticoagulation    Chronic ITP (idiopathic thrombocytopenia) (HCC) 04/10/2012   Low grade thrombocytopenia consistent with chronic ITP not in need of therapy.    DM (diabetes mellitus) ([place])    Dysrhythmia    afib   H/O cardiovascular stress test 01/31/08   no ishcemia, ef 61%   History of kidney stones    HTN (hypertension)     Hypertension    ITP (idiopathic thrombocytopenic purpura)    Thrombocytopenia ([place])     Past Surgical History:  Procedure Laterality Date   CIRCUMCISION     COLONOSCOPY  06/25/2012   Procedure: COLONOSCOPY;  Surgeon: Jamesetta So, MD;  [facility];  Service: Gastroenterology;  Laterality: N/A;   COLONOSCOPY N/A 01/30/2017   Procedure: COLONOSCOPY;  Surgeon: Aviva Signs, MD;  [facility];  Service: Gastroenterology;  Laterality: N/A;   KNEE ARTHROSCOPY     right and left knee/1992 and 2000   POLYPECTOMY  01/30/2017   Procedure: POLYPECTOMY;  Surgeon: Aviva Signs, MD;  [facility];  Service: Gastroenterology;;  colon   VASECTOMY      Current Outpatient Medications  Medication Sig Dispense Refill   ALPRAZolam (XANAX) 1 MG tablet Taking 1 tablet by mouth 4 times daily     amLODipine (NORVASC) 10 MG tablet Take 1 tablet (10 mg total) by mouth daily. 30 tablet 6   clotrimazole-betamethasone (LOTRISONE) cream Apply topically.     flecainide (TAMBOCOR) 50 MG tablet TAKE ONE (1) TABLET BY MOUTH TWO (2) TIMES DAILY 60 tablet 9   fluticasone (FLONASE) 50 MCG/ACT nasal spray Place into both nostrils daily.     glipiZIDE-metformin (METAGLIP) 5-500 MG per tablet Take 1 tablet by mouth 2 (two) times daily after a meal.     hydrochlorothiazide (HYDRODIURIL) 25 MG tablet Take 25 mg by  mouth daily.     metoprolol succinate (TOPROL-XL) 50 MG 24 hr tablet TAKE ONE (1) TABLET EACH DAY 90 tablet 1   OneTouch Delica Lancets 78E MISC      ONETOUCH VERIO test strip 1 each 2 (two) times daily.     ramipril (ALTACE) 10 MG capsule TAKE ONE CAPSULE BY MOUTH TWICE A DAY 180 capsule 2   warfarin (COUMADIN) 5 MG tablet TAKE 1 TO 1&1/2 TABLET DAILY AS DIRECTED 120 tablet 2   gabapentin (NEURONTIN) 300 MG capsule Take 300 mg by mouth daily. (Patient not taking: Reported on 07/26/2022)     No current facility-administered medications for this visit.    Allergies as of  07/26/2022 - Review Complete 07/26/2022  Allergen Reaction Noted   Penicillins Other (See Comments)     Family History  Problem Relation Age of Onset   Prostate cancer Father    Cancer Sister        breast   Cancer Brother        lung   Melanoma Mother     Social History   Socioeconomic History   Marital status: Married    Spouse name: Not on file   Number of children: Not on file   Years of education: Not on file   Highest education level: Not on file  Occupational History   Not on file  Tobacco Use   Smoking status: Never   Smokeless tobacco: Never  Vaping Use   Vaping Use: Never used  Substance and Sexual Activity   Alcohol use: No   Drug use: No   Sexual activity: Not on file  Other Topics Concern   Not on file  Social History Narrative   Not on file   Social Determinants of Health   Financial Resource Strain: Not on file  Food Insecurity: Not on file  Transportation Needs: Not on file  Physical Activity: Not on file  Stress: Not on file  Social Connections: Not on file  Intimate Partner Violence: Not on file    Subjective: Review of Systems  Constitutional:  Negative for chills and fever.  HENT:  Negative for congestion and hearing loss.   Eyes:  Negative for blurred vision and double vision.  Respiratory:  Negative for cough and shortness of breath.   Cardiovascular:  Negative for chest pain and palpitations.  Gastrointestinal:  Positive for diarrhea. Negative for abdominal pain, blood in stool, constipation, heartburn, melena and vomiting.       Stool incontinence  Genitourinary:  Negative for dysuria and urgency.  Musculoskeletal:  Negative for joint pain and myalgias.  Skin:  Negative for itching and rash.  Neurological:  Negative for dizziness and headaches.  Psychiatric/Behavioral:  Negative for depression. The patient is not nervous/anxious.        Objective: BP (!) 153/95 (BP Location: Left Arm, Patient Position: Sitting, Cuff Size:  Large)   Pulse (!) 53   Temp (!) 97.3 F (36.3 C) (Temporal)   Ht '6\' 2"'$  (1.88 m)   Wt 212 lb (96.2 kg)   BMI 27.22 kg/m  Physical Exam Constitutional:      Appearance: Normal appearance.  HENT:     Head: Normocephalic and atraumatic.  Eyes:     Extraocular Movements: Extraocular movements intact.     Conjunctiva/sclera: Conjunctivae normal.  Cardiovascular:     Rate and Rhythm: Normal rate and regular rhythm.  Pulmonary:     Effort: Pulmonary effort is normal.     Breath sounds: Normal  breath sounds.  Abdominal:     General: Bowel sounds are normal.     Palpations: Abdomen is soft.  Musculoskeletal:        General: Normal range of motion.     Cervical back: Normal range of motion and neck supple.  Skin:    General: Skin is warm.  Neurological:     General: No focal deficit present.     Mental Status: He is alert and oriented to person, place, and time.  Psychiatric:        Mood and Affect: Mood normal.        Behavior: Behavior normal.      Assessment: *Stool incontinence/leakage *Diarrhea-intermittent *Rectal bleeding *Abdominal bloating/gas *Hypertension  Plan: Etiology of patient's symptoms likely due to poor sphincter tone.  Internal hemorrhoids might also be playing a role as well.  Recommended adding Metamucil to diet.  Did not do this previously.  Told to try the gummy version and see if this is better.  I have also printed off Kegal exercises for her to perform at home to strengthen his pelvic muscles.  Discussed that internal hemorrhoids could be contributing to his leakage especially if he is having bright red blood per rectum with bowel movements.  Discussed potential hemorrhoid banding with him.  This will require flexible sigmoidoscopy to further evaluate prior.  Would like to hold off for now.  Continue Lomotil as needed for diarrhea.  Patient to take over-the-counter simethicone prior to meals to see if this helps with his abdominal  bloating/gas.  The patient was found to have elevated blood pressure when vital signs were checked in the office. The blood pressure was rechecked by the nursing staff, and it was found be persistently elevated >140/90 mmHg. I personally advised the patient to follow up closely with the PCP for hypertension control.   Follow-up in 6 months or sooner if needed.  07/26/2022 8:31 AM   Disclaimer: This note was dictated with voice recognition software. Similar sounding words can inadvertently be transcribed and may not be corrected upon review.

## 2022-07-26 NOTE — Patient Instructions (Signed)
Would recommend you try over-the-counter Metamucil Gummies instead of the powder to see if you tolerate this better.  You can also take over-the-counter simethicone (Gas-X) 10 minutes before meals to help with gas or as needed for when your gas symptoms are present.  Continue Lomotil as needed for diarrhea.  Follow-up in 6 months.  It was nice seeing you again today.  Dr. Abbey Chatters

## 2022-07-27 ENCOUNTER — Ambulatory Visit: Payer: Medicare HMO | Attending: Cardiovascular Disease | Admitting: *Deleted

## 2022-07-27 DIAGNOSIS — I4891 Unspecified atrial fibrillation: Secondary | ICD-10-CM | POA: Diagnosis not present

## 2022-07-27 DIAGNOSIS — Z5181 Encounter for therapeutic drug level monitoring: Secondary | ICD-10-CM | POA: Diagnosis not present

## 2022-07-27 LAB — POCT INR: INR: 3.3 — AB (ref 2.0–3.0)

## 2022-07-27 NOTE — Patient Instructions (Signed)
Hold warfarin today then decrease dose to 1 tablet daily  Continue greens/salad    Repeat INR in 4 weeks.

## 2022-08-10 ENCOUNTER — Ambulatory Visit (HOSPITAL_COMMUNITY)
Admission: RE | Admit: 2022-08-10 | Discharge: 2022-08-10 | Disposition: A | Discharge status: Home / Self Care | Payer: Medicare HMO | Source: Ambulatory Visit | Attending: Nurse Practitioner | Admitting: Nurse Practitioner

## 2022-08-10 ENCOUNTER — Encounter (HOSPITAL_COMMUNITY): Payer: Self-pay | Admitting: Nurse Practitioner

## 2022-08-10 VITALS — BP 186/64 | HR 53 | Ht 74.0 in | Wt 210.4 lb

## 2022-08-10 DIAGNOSIS — Z8679 Personal history of other diseases of the circulatory system: Secondary | ICD-10-CM | POA: Diagnosis not present

## 2022-08-10 DIAGNOSIS — D6869 Other thrombophilia: Secondary | ICD-10-CM | POA: Diagnosis not present

## 2022-08-10 DIAGNOSIS — I4891 Unspecified atrial fibrillation: Secondary | ICD-10-CM

## 2022-08-10 DIAGNOSIS — I10 Essential (primary) hypertension: Secondary | ICD-10-CM | POA: Diagnosis not present

## 2022-08-10 DIAGNOSIS — Z7901 Long term (current) use of anticoagulants: Secondary | ICD-10-CM | POA: Diagnosis not present

## 2022-08-10 DIAGNOSIS — Z79899 Other long term (current) drug therapy: Secondary | ICD-10-CM | POA: Diagnosis not present

## 2022-08-10 DIAGNOSIS — I480 Paroxysmal atrial fibrillation: Secondary | ICD-10-CM | POA: Diagnosis not present

## 2022-08-10 LAB — CBC
HCT: 37.1 % — ABNORMAL LOW (ref 39.0–52.0)
Hemoglobin: 12.4 g/dL — ABNORMAL LOW (ref 13.0–17.0)
MCH: 31 pg (ref 26.0–34.0)
MCHC: 33.4 g/dL (ref 30.0–36.0)
MCV: 92.8 fL (ref 80.0–100.0)
Platelets: 156 10*3/uL (ref 150–400)
RBC: 4 MIL/uL — ABNORMAL LOW (ref 4.22–5.81)
RDW: 12.4 % (ref 11.5–15.5)
WBC: 5.9 10*3/uL (ref 4.0–10.5)
nRBC: 0 % (ref 0.0–0.2)

## 2022-08-10 LAB — COMPREHENSIVE METABOLIC PANEL
ALT: 10 U/L (ref 0–44)
AST: 17 U/L (ref 15–41)
Albumin: 3.7 g/dL (ref 3.5–5.0)
Alkaline Phosphatase: 67 U/L (ref 38–126)
Anion gap: 10 (ref 5–15)
BUN: 28 mg/dL — ABNORMAL HIGH (ref 8–23)
CO2: 26 mmol/L (ref 22–32)
Calcium: 9.1 mg/dL (ref 8.9–10.3)
Chloride: 100 mmol/L (ref 98–111)
Creatinine, Ser: 1.3 mg/dL — ABNORMAL HIGH (ref 0.61–1.24)
GFR, Estimated: 55 mL/min — ABNORMAL LOW (ref >60)
Glucose, Bld: 281 mg/dL — ABNORMAL HIGH (ref 70–99)
Potassium: 4.1 mmol/L (ref 3.5–5.1)
Sodium: 136 mmol/L (ref 135–145)
Total Bilirubin: 0.7 mg/dL (ref 0.3–1.2)
Total Protein: 7 g/dL (ref 6.5–8.1)

## 2022-08-10 LAB — TSH: TSH: 1.5 u[IU]/mL (ref 0.350–4.500)

## 2022-08-10 MED ORDER — METOPROLOL SUCCINATE ER 50 MG PO TB24: 25.0000 mg | ORAL_TABLET | Freq: Every day | ORAL | 1 refills | Status: DC

## 2022-08-10 MED ORDER — AMIODARONE HCL 200 MG PO TABS: ORAL_TABLET | ORAL | 0 refills | Status: DC

## 2022-08-10 NOTE — Progress Notes (Signed)
Patient ID: HARVEST BOSMAN, male   DOB: 11-Sep-1940, 82 y.o.   MRN: [id number]     Primary Care Physician: Redmond School, MD Referring Physician: prior pt of Dr. Charlyne Petrin is a 82 y.o. male with a h/o PAF on flecainide. He reports that he had a mechanical fall one month ago  but did not suffer any significant injury, SInce then he feels that he may have had more fluttering but it is very difficult for him to  describe.Marland Kitchen   His blood pressure is also elevated but on recheck 140 /50. He shows me his BP readings many of which are showing systolics of [id number]. He was on this dose in the past but he lowered as he felt BP readings were getting too low. He tells me that he was started on a baby asa a few months ago by his PCP, he did not know why. I see in Care everywhere that he was seen in October for dizziness by Neurology at [facility] and was found to have vestibular stenosis so may be 2/2 to this. He feels he is bruising more with also being on warfarin, CHA2DS2VASc score of at least 4.  He also admits that his blood glucose not too long ago got up to 15. He made some dietary changes and brought  It down to 7 but has slipped again in his dietary control.   F/u in afib clinic, 11/17/20. He reports low afib burden. His 1st degree AV block has progressed to 272 ms so will reduce flecainide to 50 mg bid. Otherwise, no complaints today.   F/u in afib clinic, 11/23/20. He wanted to be seen as he noted some increase in fluttering for a few days after reducing flecainide dose but nothing sustained. He has not noted any afib for the last 2 days. His PR has reduced in interval length to 244 ms from 272 ms. We discussed options but for now he wants to continue with flecainide at 50 mg bid.   F/u in afib clinic, 05/24/21. He is having some breakthrough afib but is still happy with current treatment and does not want to change AAD. He is in sinus rhythm today. He continues on flecainide. He has noted some  ankle edema x 2 days. He continues on warfarin for a CHA2DS2VASc  score of at least 4. BP is elevated here but I reviewed BP's at home and BP's are well managed.     F/u in afib clinic, 11/22/21. He is in afib today, rate controlled. He states that his afib burden is fairly stable. Has a breakthrough at least once a week. Does not feel that different other than some slight fluttering in his chest. He has been resistant to change AAD's in the past. Cannot increase flecainide for conduction disease. I discussed ablation, amiodarone or Tikosyn. He has a wife that is very ill and he has to care for her so is not interested in ablation or Tikosyn today.Will update echo to look for any EF change with increased afib burden.   F/u afib clinic, 03/01/22.he is in rate controlled afib today. He cant tell his afib burden. We discussed options today, amio/tikosyn/ablation. He has a wife he cares for and has everything on him. So he is not in favor of ablation or tikosyn. He cannot go up on flecainide as it had to be lowered  in the past due to worsening first degree block. He was not aware he was in afib  this am. Sometimes can feel some fluttering. I placed a monitor on him last August and he had 8% afib burden and he chose to continue flecainide.   F/u in afib clinic. 08/09/22. By his description he is still having a high afib burden from which he is symptomatic with being lightheaded and short of breath, flecainide has become ineffective and I can not increase dose for conduction disease.  We have had many conversations re antiarrythmic change but he has has been in a prolonged state of inertia to make a decision.  He cannot do Tikosyn as he cares for his wife. Ablation was discussed and he is hesitate to pursue this. Today, he has committed to starting amiodarone, risk vrs benefit of drug explained.  He is in SR today, but feels that he had 2 days of afib with being lightheaded the first of the week.   Today, negative  for  chest pain, shortness of breath, orthopnea, PND, lower extremity edema, dizziness, presyncope, syncope, or neurologic sequela. The patient is tolerating medications without difficulties and is otherwise without complaint today.   Past Medical History:  Diagnosis Date   A-fib ([place])    paf with NSVT on MCOT, on coumadin, echo 01/31/08-nl lv, mild aortic valve sclerosis    Aneurysm ([facility])    Anxiety    Atrial fibrillation ([facility]CC)    Carotid bruit    doppler 05/17/12-mild plaque with no diameter reduction   Chronic anticoagulation    Chronic ITP (idiopathic thrombocytopenia) (HCC) 04/10/2012   Low grade thrombocytopenia consistent with chronic ITP not in need of therapy.    DM (diabetes mellitus) ([place])    Dysrhythmia    afib   H/O cardiovascular stress test 01/31/08   no ishcemia, ef 61%   History of kidney stones    HTN (hypertension)    Hypertension    ITP (idiopathic thrombocytopenic purpura)    Thrombocytopenia ([place])    Past Surgical History:  Procedure Laterality Date   CIRCUMCISION     COLONOSCOPY  06/25/2012   Procedure: COLONOSCOPY;  Surgeon: Jamesetta So, MD;  [facility];  Service: Gastroenterology;  Laterality: N/A;   COLONOSCOPY N/A 01/30/2017   Procedure: COLONOSCOPY;  Surgeon: Aviva Signs, MD;  [facility];  Service: Gastroenterology;  Laterality: N/A;   KNEE ARTHROSCOPY     right and left knee/1992 and 2000   POLYPECTOMY  01/30/2017   Procedure: POLYPECTOMY;  Surgeon: Aviva Signs, MD;  [facility];  Service: Gastroenterology;;  colon   VASECTOMY      Current Outpatient Medications  Medication Sig Dispense Refill   ALPRAZolam (XANAX) 1 MG tablet Taking 1 tablet by mouth 4 times daily     amLODipine (NORVASC) 10 MG tablet Take 1 tablet (10 mg total) by mouth daily. 30 tablet 6   clotrimazole-betamethasone (LOTRISONE) cream Apply topically.     fluticasone (FLONASE) 50 MCG/ACT nasal spray Place into both nostrils daily.      glipiZIDE-metformin (METAGLIP) 5-500 MG per tablet Take 1 tablet by mouth 2 (two) times daily after a meal.     hydrochlorothiazide (HYDRODIURIL) 25 MG tablet Take 25 mg by mouth daily.     metoprolol succinate (TOPROL-XL) 50 MG 24 hr tablet TAKE ONE (1) TABLET EACH DAY 90 tablet 1   OneTouch Delica Lancets 35D MISC      ONETOUCH VERIO test strip 1 each 2 (two) times daily.     ramipril (ALTACE) 10 MG capsule TAKE ONE CAPSULE BY  MOUTH TWICE A DAY 180 capsule 2   warfarin (COUMADIN) 5 MG tablet TAKE 1 TO 1&1/2 TABLET DAILY AS DIRECTED 120 tablet 2   gabapentin (NEURONTIN) 300 MG capsule Take 300 mg by mouth daily. (Patient not taking: Reported on 08/10/2022)     No current facility-administered medications for this encounter.    Allergies  Allergen Reactions   Penicillins Other (See Comments)    Childhood allergy Has patient had a PCN reaction causing immediate rash, facial/tongue/throat swelling, SOB or lightheadedness with hypotension: Unknown Has patient had a PCN reaction causing severe rash involving mucus membranes or skin necrosis: Unknown Has patient had a PCN reaction that required hospitalization: Unknown Has patient had a PCN reaction occurring within the last 10 years: Unknown If all of the above answers are "NO", then may proceed with Cephalosporin use.    Social History   Socioeconomic History   Marital status: Married    Spouse name: Not on file   Number of children: Not on file   Years of education: Not on file   Highest education level: Not on file  Occupational History   Not on file  Tobacco Use   Smoking status: Never   Smokeless tobacco: Never  Vaping Use   Vaping Use: Never used  Substance and Sexual Activity   Alcohol use: No   Drug use: No   Sexual activity: Not on file  Other Topics Concern   Not on file  Social History Narrative   Not on file   Social Determinants of Health   Financial Resource Strain: Not on file  Food Insecurity: Not on file   Transportation Needs: Not on file  Physical Activity: Not on file  Stress: Not on file  Social Connections: Not on file  Intimate Partner Violence: Not on file    Family History  Problem Relation Age of Onset   Prostate cancer Father    Cancer Sister        breast   Cancer Brother        lung   Melanoma Mother     ROS- All systems are reviewed and negative except as per the HPI above  Physical Exam: Vitals:   08/10/22 0852  BP: (!) 186/64  Pulse: (!) 53  Weight: 95.4 kg  Height: '6\' 2"'$  (1.88 m)    GEN- The patient is well appearing, alert and oriented x 3 today.   Head- normocephalic, atraumatic Eyes-  Sclera clear, conjunctiva pink Ears- hearing intact Oropharynx- clear Neck- supple, no JVP Lymph- no cervical lymphadenopathy Lungs- Clear to ausculation bilaterally, normal work of breathing Heart- Regular rate and rhythm, no murmurs, rubs or gallops, PMI not laterally displaced Extremities- no clubbing, cyanosis, or edema MS- no significant deformity or atrophy Skin- no rash or lesion Psych- euthymic mood, full affect Neuro- strength and sensation are intact  EKG- Vent. rate 53 BPM PR interval 238 ms QRS duration 110 ms QT/QTcB 468/439 ms P-R-T axes 77 50 55 Sinus bradycardia with 1st degree A-V block Otherwise normal ECG When compared with ECG of 01-Mar-2022 09:01, PREVIOUS ECG IS PRESENT  Echo-1. Left ventricular ejection fraction, by estimation, is 60 to 65%. The  left ventricle has normal function. The left ventricle has no regional  wall motion abnormalities. The left ventricular internal cavity size was  mildly dilated. Left ventricular  diastolic function could not be evaluated.   2. Right ventricular systolic function is normal. The right ventricular  size is normal.   3. Right atrial  size was mildly dilated.   4. The mitral valve is normal in structure. Mild mitral valve  regurgitation. No evidence of mitral stenosis.   5. The aortic valve is  tricuspid. Aortic valve regurgitation is not  visualized. Aortic valve sclerosis/calcification is present, without any  evidence of aortic stenosis.   6. The inferior vena cava is normal in size with greater than 50%  respiratory variability, suggesting right atrial pressure of 3 mmHg.   Assessment and Plan: 1. Paroxysmal atrial fibrillation  Decreased flecainide 100 mg bid to 50 mg bid for progression of first degree AV block He has had  breakthrough afib for some time but has been in a state of inertia regarding medication management/change  Defers an ablation  Cannot afford the time away from wife for tikosyn admit  After lengthy talk again today and the risk vrs benefit of amiodarone discussed, he has committed to change to amiodarone CXR, cmet/tsh/cbc today for baseline  He will washout flecainide x 3 days and during this time stay on metoprolol 50 mg daily He  will then start 200 mg of amiodarone bid x one month  and lower metoprolol to 25 mg daily    2.HTN Elevated here but I reviewed BP readings from home and they appear to be well controlled   3. Chadsvasc score of at least 4 Continue warfarin, followed by [facility] office  He will need more frequent monitoring with amiodarone load Lattie Haw in the [place] office has been notified  F/u in one week after amio start for EKG    Butch Penny C. Mazey Mantell, [facility] [address] [place], [place] [address] [phone]

## 2022-08-10 NOTE — Patient Instructions (Signed)
STOP flecainide    On Monday January 29th -- DECREASE metoprolol to '25mg'$  a day (1/2 of your '50mg'$  tablet)  On Monday January 29th -- START Amiodarone '200mg'$  - take 1 tablet twice a day with food

## 2022-08-21 ENCOUNTER — Ambulatory Visit: Payer: Medicare HMO | Attending: Cardiovascular Disease | Admitting: *Deleted

## 2022-08-21 DIAGNOSIS — I4891 Unspecified atrial fibrillation: Secondary | ICD-10-CM

## 2022-08-21 DIAGNOSIS — Z5181 Encounter for therapeutic drug level monitoring: Secondary | ICD-10-CM | POA: Diagnosis not present

## 2022-08-21 LAB — POCT INR: INR: 2.9 (ref 2.0–3.0)

## 2022-08-21 NOTE — Patient Instructions (Signed)
Started Amiodarone '200mg'$  BID on 1/29  (Will be on this dose 1 month 09/14/22) Continue warfarin 1 tablet daily  Continue greens/salad    Repeat INR in 1 week

## 2022-08-22 ENCOUNTER — Ambulatory Visit (HOSPITAL_COMMUNITY)
Admission: RE | Admit: 2022-08-22 | Discharge: 2022-08-22 | Disposition: A | Discharge status: Home / Self Care | Payer: Medicare HMO | Source: Ambulatory Visit | Attending: Nurse Practitioner | Admitting: Nurse Practitioner

## 2022-08-22 VITALS — HR 65 | Ht 74.0 in

## 2022-08-22 DIAGNOSIS — R9431 Abnormal electrocardiogram [ECG] [EKG]: Secondary | ICD-10-CM | POA: Insufficient documentation

## 2022-08-22 DIAGNOSIS — I4891 Unspecified atrial fibrillation: Secondary | ICD-10-CM | POA: Insufficient documentation

## 2022-08-22 NOTE — Progress Notes (Signed)
Pt in for EKG after starting amiodarone load of 200 mg bid for the last week. He feels well. He is in afib this am but has not noted any afib for the last week. He is having his INR's checked weekly with the addition of amiodarone. I will see back in 2 weeks.  Vent. rate 65 BPM PR interval * ms QRS duration 106 ms QT/QTcB 446/463 ms P-R-T axes * 43 41 Atrial fibrillation Abnormal ECG When compared with ECG of 10-Aug-2022 09:04, PREVIOUS ECG IS PRESENT

## 2022-08-24 ENCOUNTER — Encounter (HOSPITAL_COMMUNITY): Payer: Self-pay | Admitting: *Deleted

## 2022-08-28 ENCOUNTER — Ambulatory Visit: Payer: Medicare HMO | Attending: Cardiovascular Disease | Admitting: *Deleted

## 2022-08-28 DIAGNOSIS — Z5181 Encounter for therapeutic drug level monitoring: Secondary | ICD-10-CM

## 2022-08-28 DIAGNOSIS — I4891 Unspecified atrial fibrillation: Secondary | ICD-10-CM

## 2022-08-28 LAB — POCT INR: INR: 2.9 (ref 2.0–3.0)

## 2022-08-28 NOTE — Patient Instructions (Signed)
Started Amiodarone 298m BID on 1/29  (Will be on this dose 1 month 09/14/22) Continue warfarin 1 tablet daily  Continue greens/salad    Repeat INR in 2 weeks

## 2022-08-29 ENCOUNTER — Ambulatory Visit (HOSPITAL_COMMUNITY): Payer: Medicare HMO | Admitting: Nurse Practitioner

## 2022-09-01 ENCOUNTER — Other Ambulatory Visit (HOSPITAL_COMMUNITY): Payer: Self-pay | Admitting: Nurse Practitioner

## 2022-09-06 ENCOUNTER — Encounter (HOSPITAL_COMMUNITY): Payer: Self-pay | Admitting: Nurse Practitioner

## 2022-09-06 ENCOUNTER — Ambulatory Visit (HOSPITAL_COMMUNITY)
Admission: RE | Admit: 2022-09-06 | Discharge: 2022-09-06 | Disposition: A | Discharge status: Home / Self Care | Payer: Medicare HMO | Source: Ambulatory Visit | Attending: Nurse Practitioner | Admitting: Nurse Practitioner

## 2022-09-06 VITALS — BP 194/66 | HR 54 | Ht 74.0 in | Wt 218.4 lb

## 2022-09-06 DIAGNOSIS — I10 Essential (primary) hypertension: Secondary | ICD-10-CM | POA: Diagnosis not present

## 2022-09-06 DIAGNOSIS — I340 Nonrheumatic mitral (valve) insufficiency: Secondary | ICD-10-CM | POA: Insufficient documentation

## 2022-09-06 DIAGNOSIS — I480 Paroxysmal atrial fibrillation: Secondary | ICD-10-CM | POA: Diagnosis not present

## 2022-09-06 DIAGNOSIS — Z7901 Long term (current) use of anticoagulants: Secondary | ICD-10-CM | POA: Diagnosis not present

## 2022-09-06 DIAGNOSIS — D6869 Other thrombophilia: Secondary | ICD-10-CM

## 2022-09-06 MED ORDER — AMIODARONE HCL 200 MG PO TABS: 200.0000 mg | ORAL_TABLET | Freq: Every day | ORAL | 1 refills | Status: DC

## 2022-09-06 NOTE — Progress Notes (Signed)
Patient ID: Jason Alvarez, male   DOB: 11-Sep-1940, 82 y.o.   MRN: [id number]     Primary Care Physician: Redmond School, MD Referring Physician: prior pt of Dr. Charlyne Petrin is a 82 y.o. male with a h/o PAF on flecainide. He reports that he had a mechanical fall one month ago  but did not suffer any significant injury, SInce then he feels that he may have had more fluttering but it is very difficult for him to  describe.Marland Kitchen   His blood pressure is also elevated but on recheck 140 /50. He shows me his BP readings many of which are showing systolics of [id number]. He was on this dose in the past but he lowered as he felt BP readings were getting too low. He tells me that he was started on a baby asa a few months ago by his PCP, he did not know why. I see in Care everywhere that he was seen in October for dizziness by Neurology at [facility] and was found to have vestibular stenosis so may be 2/2 to this. He feels he is bruising more with also being on warfarin, CHA2DS2VASc score of at least 4.  He also admits that his blood glucose not too long ago got up to 15. He made some dietary changes and brought  It down to 7 but has slipped again in his dietary control.   F/u in afib clinic, 11/17/20. He reports low afib burden. His 1st degree AV block has progressed to 272 ms so will reduce flecainide to 50 mg bid. Otherwise, no complaints today.   F/u in afib clinic, 11/23/20. He wanted to be seen as he noted some increase in fluttering for a few days after reducing flecainide dose but nothing sustained. He has not noted any afib for the last 2 days. His PR has reduced in interval length to 244 ms from 272 ms. We discussed options but for now he wants to continue with flecainide at 50 mg bid.   F/u in afib clinic, 05/24/21. He is having some breakthrough afib but is still happy with current treatment and does not want to change AAD. He is in sinus rhythm today. He continues on flecainide. He has noted some  ankle edema x 2 days. He continues on warfarin for a CHA2DS2VASc  score of at least 4. BP is elevated here but I reviewed BP's at home and BP's are well managed.     F/u in afib clinic, 11/22/21. He is in afib today, rate controlled. He states that his afib burden is fairly stable. Has a breakthrough at least once a week. Does not feel that different other than some slight fluttering in his chest. He has been resistant to change AAD's in the past. Cannot increase flecainide for conduction disease. I discussed ablation, amiodarone or Tikosyn. He has a wife that is very ill and he has to care for her so is not interested in ablation or Tikosyn today.Will update echo to look for any EF change with increased afib burden.   F/u afib clinic, 03/01/22.he is in rate controlled afib today. He cant tell his afib burden. We discussed options today, amio/tikosyn/ablation. He has a wife he cares for and has everything on him. So he is not in favor of ablation or tikosyn. He cannot go up on flecainide as it had to be lowered  in the past due to worsening first degree block. He was not aware he was in afib  this am. Sometimes can feel some fluttering. I placed a monitor on him last August and he had 8% afib burden and he chose to continue flecainide.   F/u in afib clinic. 08/09/22. By his description he is still having a high afib burden from which he is symptomatic with being lightheaded and short of breath, flecainide has become ineffective and I can not increase dose for conduction disease.  We have had many conversations re antiarrythmic change but he has has been in a prolonged state of inertia to make a decision.  He cannot do Tikosyn as he cares for his wife. Ablation was discussed and he is hesitate to pursue this. Today, he has committed to starting amiodarone, risk vrs benefit of drug explained.  He is in SR today, but feels that he had 2 days of afib with being lightheaded the first of the week.   F/u 09/06/22. He  has not noted any afib since starting on amiodarone. No side effects from  drug. BP elevated on presentation here but on recheck it was 140/70.   Today, negative for  chest pain, shortness of breath, orthopnea, PND, lower extremity edema, dizziness, presyncope, syncope, or neurologic sequela. The patient is tolerating medications without difficulties and is otherwise without complaint today.   Past Medical History:  Diagnosis Date   A-fib ([place])    paf with NSVT on MCOT, on coumadin, echo 01/31/08-nl lv, mild aortic valve sclerosis    Aneurysm ([facility])    Anxiety    Atrial fibrillation ([facility])    Carotid bruit    doppler 05/17/12-mild plaque with no diameter reduction   Chronic anticoagulation    Chronic ITP (idiopathic thrombocytopenia) ([facility]) 04/10/2012   Low grade thrombocytopenia consistent with chronic ITP not in need of therapy.    DM (diabetes mellitus) ([place])    Dysrhythmia    afib   H/O cardiovascular stress test 01/31/08   no ishcemia, ef 61%   History of kidney stones    HTN (hypertension)    Hypertension    ITP (idiopathic thrombocytopenic purpura)    Thrombocytopenia ([place])    Past Surgical History:  Procedure Laterality Date   CIRCUMCISION     COLONOSCOPY  06/25/2012   Procedure: COLONOSCOPY;  Surgeon: Jamesetta So, MD;  Location: [facility];  Service: Gastroenterology;  Laterality: N/A;   COLONOSCOPY N/A 01/30/2017   Procedure: COLONOSCOPY;  Surgeon: Aviva Signs, MD;  Location: [facility];  Service: Gastroenterology;  Laterality: N/A;   KNEE ARTHROSCOPY     right and left knee/1992 and 2000   POLYPECTOMY  01/30/2017   Procedure: POLYPECTOMY;  Surgeon: Aviva Signs, MD;  Location: [facility];  Service: Gastroenterology;;  colon   VASECTOMY      Current Outpatient Medications  Medication Sig Dispense Refill   ALPRAZolam (XANAX) 1 MG tablet Taking 1 tablet by mouth 4 times daily     amiodarone (PACERONE) 200 MG tablet Take 1 tablet by mouth twice a day for 1  month then reduce to 1 tablet daily 60 tablet 0   amLODipine (NORVASC) 10 MG tablet Take 1 tablet (10 mg total) by mouth daily. 30 tablet 6   clotrimazole-betamethasone (LOTRISONE) cream Apply topically.     fluticasone (FLONASE) 50 MCG/ACT nasal spray Place into both nostrils daily.     glipiZIDE-metformin (METAGLIP) 5-500 MG per tablet Take 1 tablet by mouth 2 (two) times daily after a meal. Patient only taking one tablet daily     hydrochlorothiazide (HYDRODIURIL) 25  MG tablet Take 25 mg by mouth daily.     metoprolol succinate (TOPROL-XL) 25 MG 24 hr tablet Take 1 tablet (25 mg total) by mouth daily. 90 tablet 1   nystatin ointment (MYCOSTATIN) Apply 1 Application topically every morning.     OneTouch Delica Lancets [id number] MISC      ONETOUCH VERIO test strip 1 each 2 (two) times daily.     ramipril (ALTACE) 10 MG capsule TAKE ONE CAPSULE BY MOUTH TWICE A DAY 180 capsule 2   warfarin (COUMADIN) 5 MG tablet TAKE 1 TO 1&1/2 TABLET DAILY AS DIRECTED 120 tablet 2   gabapentin (NEURONTIN) 300 MG capsule Take 300 mg by mouth daily. (Patient not taking: Reported on 09/06/2022)     No current facility-administered medications for this encounter.    Allergies  Allergen Reactions   Penicillins Other (See Comments)    Childhood allergy Has patient had a PCN reaction causing immediate rash, facial/tongue/throat swelling, SOB or lightheadedness with hypotension: Unknown Has patient had a PCN reaction causing severe rash involving mucus membranes or skin necrosis: Unknown Has patient had a PCN reaction that required hospitalization: Unknown Has patient had a PCN reaction occurring within the last 10 years: Unknown If all of the above answers are "NO", then may proceed with Cephalosporin use.    Social History   Socioeconomic History   Marital status: Married    Spouse name: Not on file   Number of children: Not on file   Years of education: Not on file   Highest education level: Not on file   Occupational History   Not on file  Tobacco Use   Smoking status: Never   Smokeless tobacco: Never  Vaping Use   Vaping Use: Never used  Substance and Sexual Activity   Alcohol use: No   Drug use: No   Sexual activity: Not on file  Other Topics Concern   Not on file  Social History Narrative   Not on file   Social Determinants of Health   Financial Resource Strain: Not on file  Food Insecurity: Not on file  Transportation Needs: Not on file  Physical Activity: Not on file  Stress: Not on file  Social Connections: Not on file  Intimate Partner Violence: Not on file    Family History  Problem Relation Age of Onset   Prostate cancer Father    Cancer Sister        breast   Cancer Brother        lung   Melanoma Mother     ROS- All systems are reviewed and negative except as per the HPI above  Physical Exam: Vitals:   09/06/22 0822  BP: (!) 194/66  Pulse: (!) 54  Weight: 99.1 kg  Height: 6' 2"$  (1.88 m)    GEN- The patient is well appearing, alert and oriented x 3 today.   Head- normocephalic, atraumatic Eyes-  Sclera clear, conjunctiva pink Ears- hearing intact Oropharynx- clear Neck- supple, no JVP Lymph- no cervical lymphadenopathy Lungs- Clear to ausculation bilaterally, normal work of breathing Heart- Regular rate and rhythm, no murmurs, rubs or gallops, PMI not laterally displaced Extremities- no clubbing, cyanosis, or edema MS- no significant deformity or atrophy Skin- no rash or lesion Psych- euthymic mood, full affect Neuro- strength and sensation are intact  EKG- Vent. rate 54 BPM PR interval 222 ms QRS duration 110 ms QT/QTcB 490/464 ms P-R-T axes 45 42 9 Sinus bradycardia with 1st degree A-V block Otherwise normal ECG  When compared with ECG of 22-Aug-2022 08:39, PREVIOUS ECG IS PRESENT  Echo-1. Left ventricular ejection fraction, by estimation, is 60 to 65%. The  left ventricle has normal function. The left ventricle has no regional   wall motion abnormalities. The left ventricular internal cavity size was  mildly dilated. Left ventricular  diastolic function could not be evaluated.   2. Right ventricular systolic function is normal. The right ventricular  size is normal.   3. Right atrial size was mildly dilated.   4. The mitral valve is normal in structure. Mild mitral valve  regurgitation. No evidence of mitral stenosis.   5. The aortic valve is tricuspid. Aortic valve regurgitation is not  visualized. Aortic valve sclerosis/calcification is present, without any  evidence of aortic stenosis.   6. The inferior vena cava is normal in size with greater than 50%  respiratory variability, suggesting right atrial pressure of 3 mmHg.   CXR- 08/10/22 Narrative & Impression  CLINICAL DATA:  Dyspnea   EXAM: CHEST - 2 VIEW   COMPARISON:  07/01/2009 chest radiograph.   FINDINGS: Stable cardiomediastinal silhouette with normal heart size. No pneumothorax. No pleural effusion. Lungs appear clear, with no acute consolidative airspace disease and no pulmonary edema.   IMPRESSION: No active cardiopulmonary disease.      Assessment and Plan: 1. Paroxysmal atrial fibrillation  Decreased flecainide 100 mg bid to 50 mg bid for progression of first degree AV block He has had  breakthrough afib for some time but had been in a state of inertia regarding medication management/change  Defers an ablation  Cannot afford the time away from wife for tikosyn admit  After lengthy talk again today and the risk vrs benefit of amiodarone discussed, he committed to change to amiodarone CXR, cmet/tsh/cbc  for baseline  He  washed out flecainide x 3 days and during this time stayed on metoprolol 50 mg daily Then started 200 mg of amiodarone bid x one month  and lowered metoprolol to 25 mg daily  He reports no afib since start of amiodrone    2.HTN Elevated here on presentation but recheck at 140/70   3. Chadsvasc score of at least  4 Continue warfarin, followed by [facility] office  He will need more frequent monitoring with amiodarone load Lattie Haw in the [place] office has been notified  F/u  in 6 weeks with tsh/cmet    Butch Penny C. Zerek Litsey, [facility] [address] [place], [place] [address] [phone]

## 2022-09-06 NOTE — Patient Instructions (Signed)
February 26th reduce amiodarone to 225m once a day

## 2022-09-11 ENCOUNTER — Ambulatory Visit: Payer: Medicare HMO | Attending: Cardiovascular Disease | Admitting: Pharmacist

## 2022-09-11 DIAGNOSIS — I4891 Unspecified atrial fibrillation: Secondary | ICD-10-CM | POA: Diagnosis not present

## 2022-09-11 DIAGNOSIS — Z5181 Encounter for therapeutic drug level monitoring: Secondary | ICD-10-CM | POA: Diagnosis not present

## 2022-09-11 DIAGNOSIS — Z7901 Long term (current) use of anticoagulants: Secondary | ICD-10-CM | POA: Diagnosis not present

## 2022-09-11 LAB — POCT INR: POC INR: 3.4

## 2022-09-11 NOTE — Patient Instructions (Signed)
Hold warfarin today then START taking warfarin 1 tablet daily except 1/2 tablet on Wednesday.  Continue greens/salad  Repeat INR in 2 weeks

## 2022-09-25 ENCOUNTER — Ambulatory Visit: Payer: Medicare HMO | Attending: Cardiovascular Disease | Admitting: *Deleted

## 2022-09-25 DIAGNOSIS — Z5181 Encounter for therapeutic drug level monitoring: Secondary | ICD-10-CM

## 2022-09-25 DIAGNOSIS — I4891 Unspecified atrial fibrillation: Secondary | ICD-10-CM

## 2022-09-25 LAB — POCT INR: INR: 3.1 — AB (ref 2.0–3.0)

## 2022-09-25 NOTE — Patient Instructions (Signed)
Started Amiodarone '200mg'$  BID on 1/29  (Will be on this dose 1 month 09/14/22) Decrease warfarin to 1 tablet daily except 1/2 tablet on Mondays, Wednesdays and Fridays Continue greens/salad  Repeat INR in 3 weeks

## 2022-10-16 ENCOUNTER — Ambulatory Visit: Payer: Medicare HMO | Attending: Cardiovascular Disease | Admitting: *Deleted

## 2022-10-16 DIAGNOSIS — I4891 Unspecified atrial fibrillation: Secondary | ICD-10-CM | POA: Diagnosis not present

## 2022-10-16 DIAGNOSIS — Z5181 Encounter for therapeutic drug level monitoring: Secondary | ICD-10-CM

## 2022-10-16 LAB — POCT INR: INR: 3.2 — AB (ref 2.0–3.0)

## 2022-10-16 NOTE — Patient Instructions (Signed)
On Amiodarone 200mg  daily Decrease warfarin to 1/2 tablet daily except 1 tablet on Sundays, Tuesdays and Thursdays Continue greens/salad  Repeat INR in 3 weeks

## 2022-10-18 ENCOUNTER — Encounter (HOSPITAL_COMMUNITY): Payer: Self-pay | Admitting: Physician Assistant

## 2022-10-18 ENCOUNTER — Ambulatory Visit (HOSPITAL_COMMUNITY)
Admission: RE | Admit: 2022-10-18 | Discharge: 2022-10-18 | Disposition: A | Discharge status: Home / Self Care | Payer: Medicare HMO | Source: Ambulatory Visit | Attending: Physician Assistant | Admitting: Physician Assistant

## 2022-10-18 VITALS — BP 148/64 | HR 73 | Ht 74.0 in | Wt 221.2 lb

## 2022-10-18 DIAGNOSIS — Z5181 Encounter for therapeutic drug level monitoring: Secondary | ICD-10-CM

## 2022-10-18 DIAGNOSIS — Z79899 Other long term (current) drug therapy: Secondary | ICD-10-CM | POA: Diagnosis not present

## 2022-10-18 DIAGNOSIS — D6869 Other thrombophilia: Secondary | ICD-10-CM | POA: Diagnosis not present

## 2022-10-18 DIAGNOSIS — I480 Paroxysmal atrial fibrillation: Secondary | ICD-10-CM | POA: Diagnosis not present

## 2022-10-18 DIAGNOSIS — I10 Essential (primary) hypertension: Secondary | ICD-10-CM | POA: Diagnosis not present

## 2022-10-18 DIAGNOSIS — Z7901 Long term (current) use of anticoagulants: Secondary | ICD-10-CM | POA: Insufficient documentation

## 2022-10-18 LAB — COMPREHENSIVE METABOLIC PANEL
ALT: 13 U/L (ref 0–44)
AST: 17 U/L (ref 15–41)
Albumin: 3.5 g/dL (ref 3.5–5.0)
Alkaline Phosphatase: 51 U/L (ref 38–126)
Anion gap: 12 (ref 5–15)
BUN: 25 mg/dL — ABNORMAL HIGH (ref 8–23)
CO2: 24 mmol/L (ref 22–32)
Calcium: 8.8 mg/dL — ABNORMAL LOW (ref 8.9–10.3)
Chloride: 103 mmol/L (ref 98–111)
Creatinine, Ser: 1.37 mg/dL — ABNORMAL HIGH (ref 0.61–1.24)
GFR, Estimated: 52 mL/min — ABNORMAL LOW (ref >60)
Glucose, Bld: 196 mg/dL — ABNORMAL HIGH (ref 70–99)
Potassium: 3.4 mmol/L — ABNORMAL LOW (ref 3.5–5.1)
Sodium: 139 mmol/L (ref 135–145)
Total Bilirubin: 1 mg/dL (ref 0.3–1.2)
Total Protein: 6.6 g/dL (ref 6.5–8.1)

## 2022-10-18 LAB — TSH: TSH: 3.968 u[IU]/mL (ref 0.350–4.500)

## 2022-10-18 NOTE — Progress Notes (Addendum)
Primary Care Physician: Redmond School, MD Referring Physician: prior pt of Dr. Charlyne Petrin is a 82 y.o. male with a h/o PAF on flecainide. He reports that he had a mechanical fall one month ago  but did not suffer any significant injury, SInce then he feels that he may have had more fluttering but it is very difficult for him to  describe.Marland Kitchen   His blood pressure is also elevated but on recheck 140 /50. He shows me his BP readings many of which are showing systolics of [id number]. He was on this dose in the past but he lowered as he felt BP readings were getting too low. He tells me that he was started on a baby asa a few months ago by his PCP, he did not know why. I see in Care everywhere that he was seen in October for dizziness by Neurology at [facility] and was found to have vestibular stenosis so may be 2/2 to this. He feels he is bruising more with also being on warfarin, CHA2DS2VASc score of at least 4.  He also admits that his blood glucose not too long ago got up to 15. He made some dietary changes and brought  It down to 7 but has slipped again in his dietary control.   F/u in afib clinic, 11/17/20. He reports low afib burden. His 1st degree AV block has progressed to 272 ms so will reduce flecainide to 50 mg bid. Otherwise, no complaints today.   F/u in afib clinic, 11/23/20. He wanted to be seen as he noted some increase in fluttering for a few days after reducing flecainide dose but nothing sustained. He has not noted any afib for the last 2 days. His PR has reduced in interval length to 244 ms from 272 ms. We discussed options but for now he wants to continue with flecainide at 50 mg bid.   F/u in afib clinic, 05/24/21. He is having some breakthrough afib but is still happy with current treatment and does not want to change AAD. He is in sinus rhythm today. He continues on flecainide. He has noted some ankle edema x 2 days. He continues on warfarin for a CHA2DS2VASc  score of at  least 4. BP is elevated here but I reviewed BP's at home and BP's are well managed.     F/u in afib clinic, 11/22/21. He is in afib today, rate controlled. He states that his afib burden is fairly stable. Has a breakthrough at least once a week. Does not feel that different other than some slight fluttering in his chest. He has been resistant to change AAD's in the past. Cannot increase flecainide for conduction disease. I discussed ablation, amiodarone or Tikosyn. He has a wife that is very ill and he has to care for her so is not interested in ablation or Tikosyn today.Will update echo to look for any EF change with increased afib burden.   F/u afib clinic, 03/01/22.he is in rate controlled afib today. He cant tell his afib burden. We discussed options today, amio/tikosyn/ablation. He has a wife he cares for and has everything on him. So he is not in favor of ablation or tikosyn. He cannot go up on flecainide as it had to be lowered  in the past due to worsening first degree block. He was not aware he was in afib this am. Sometimes can feel some fluttering. I placed a monitor on him last August and  he had 8% afib burden and he chose to continue flecainide.   F/u in afib clinic. 08/09/22. By his description he is still having a high afib burden from which he is symptomatic with being lightheaded and short of breath, flecainide has become ineffective and I can not increase dose for conduction disease.  We have had many conversations re antiarrythmic change but he has has been in a prolonged state of inertia to make a decision.  He cannot do Tikosyn as he cares for his wife. Ablation was discussed and he is hesitate to pursue this. Today, he has committed to starting amiodarone, risk vrs benefit of drug explained.  He is in SR today, but feels that he had 2 days of afib with being lightheaded the first of the week.   F/u 09/06/22. He has not noted any afib since starting on amiodarone. No side effects from  drug.  BP elevated on presentation here but on recheck it was 140/70.   Follow up in the AF clinic 10/18/22. Patient reports that he noticed he went into afib sometime yesterday. He remains in rate controlled afib today. However, patient states that his afib has been much less often since switching to amiodarone and he is not as symptomatic while in afib. No bleeding issues on anticoagulation.   Today, negative for palpitations, chest pain, shortness of breath, orthopnea, PND, lower extremity edema, dizziness, presyncope, syncope, or neurologic sequela. The patient is tolerating medications without difficulties and is otherwise without complaint today.   Past Medical History:  Diagnosis Date   A-fib    paf with NSVT on MCOT, on coumadin, echo 01/31/08-nl lv, mild aortic valve sclerosis    Aneurysm    Anxiety    Atrial fibrillation    Carotid bruit    doppler 05/17/12-mild plaque with no diameter reduction   Chronic anticoagulation    Chronic ITP (idiopathic thrombocytopenia) 04/10/2012   Low grade thrombocytopenia consistent with chronic ITP not in need of therapy.    DM (diabetes mellitus)    Dysrhythmia    afib   H/O cardiovascular stress test 01/31/08   no ishcemia, ef 61%   History of kidney stones    HTN (hypertension)    Hypertension    ITP (idiopathic thrombocytopenic purpura)    Thrombocytopenia    Past Surgical History:  Procedure Laterality Date   CIRCUMCISION     COLONOSCOPY  06/25/2012   Procedure: COLONOSCOPY;  Surgeon: Jamesetta So, MD;  [facility];  Service: Gastroenterology;  Laterality: N/A;   COLONOSCOPY N/A 01/30/2017   Procedure: COLONOSCOPY;  Surgeon: Aviva Signs, MD;  Location: [facility];  Service: Gastroenterology;  Laterality: N/A;   KNEE ARTHROSCOPY     right and left knee/1992 and 2000   POLYPECTOMY  01/30/2017   Procedure: POLYPECTOMY;  Surgeon: Aviva Signs, MD;  Location: [facility];  Service: Gastroenterology;;  colon   VASECTOMY       Current Outpatient Medications  Medication Sig Dispense Refill   ALPRAZolam (XANAX) 1 MG tablet Taking 1 tablet by mouth 4 times daily     amiodarone (PACERONE) 200 MG tablet Take 1 tablet (200 mg total) by mouth daily. 90 tablet 1   amLODipine (NORVASC) 10 MG tablet Take 1 tablet (10 mg total) by mouth daily. 30 tablet 6   clotrimazole-betamethasone (LOTRISONE) cream Apply topically.     fluticasone (FLONASE) 50 MCG/ACT nasal spray Place into both nostrils daily.     gabapentin (NEURONTIN) 300 MG capsule  Take 300 mg by mouth daily.     glipiZIDE-metformin (METAGLIP) 5-500 MG per tablet Take 1 tablet by mouth 2 (two) times daily after a meal. Patient only taking one tablet daily     hydrochlorothiazide (HYDRODIURIL) 25 MG tablet Take 25 mg by mouth daily.     metoprolol succinate (TOPROL-XL) 25 MG 24 hr tablet Take 1 tablet (25 mg total) by mouth daily. 90 tablet 1   nystatin ointment (MYCOSTATIN) Apply 1 Application topically every morning.     OneTouch Delica Lancets [id number] MISC      ONETOUCH VERIO test strip 1 each 2 (two) times daily.     ramipril (ALTACE) 10 MG capsule TAKE ONE CAPSULE BY MOUTH TWICE A DAY 180 capsule 2   warfarin (COUMADIN) 5 MG tablet TAKE 1 TO 1&1/2 TABLET DAILY AS DIRECTED 120 tablet 2   No current facility-administered medications for this encounter.    Allergies  Allergen Reactions   Penicillins Other (See Comments)    Childhood allergy Has patient had a PCN reaction causing immediate rash, facial/tongue/throat swelling, SOB or lightheadedness with hypotension: Unknown Has patient had a PCN reaction causing severe rash involving mucus membranes or skin necrosis: Unknown Has patient had a PCN reaction that required hospitalization: Unknown Has patient had a PCN reaction occurring within the last 10 years: Unknown If all of the above answers are "NO", then may proceed with Cephalosporin use.    Social History   Socioeconomic History   Marital status:  Married    Spouse name: Not on file   Number of children: Not on file   Years of education: Not on file   Highest education level: Not on file  Occupational History   Not on file  Tobacco Use   Smoking status: Never   Smokeless tobacco: Never   Tobacco comments:    Never smoke 10/18/22  Vaping Use   Vaping Use: Never used  Substance and Sexual Activity   Alcohol use: No   Drug use: No   Sexual activity: Not on file  Other Topics Concern   Not on file  Social History Narrative   Not on file   Social Determinants of Health   Financial Resource Strain: Not on file  Food Insecurity: Not on file  Transportation Needs: Not on file  Physical Activity: Not on file  Stress: Not on file  Social Connections: Not on file  Intimate Partner Violence: Not on file    Family History  Problem Relation Age of Onset   Prostate cancer Father    Cancer Sister        breast   Cancer Brother        lung   Melanoma Mother     ROS- All systems are reviewed and negative except as per the HPI above  Physical Exam: Vitals:   10/18/22 0823  BP: (!) 148/64  Pulse: 73  Weight: 100.3 kg  Height: 6\' 2"  (1.88 m)     GEN- The patient is a well appearing elderly male, alert and oriented x 3 today.   HEENT-head normocephalic, atraumatic, sclera clear, conjunctiva pink, hearing intact, trachea midline. Lungs- Clear to ausculation bilaterally, normal work of breathing Heart- irregular rate and rhythm, no murmurs, rubs or gallops  GI- soft, NT, ND, + BS Extremities- no clubbing, cyanosis, or edema MS- no significant deformity or atrophy Skin- no rash or lesion Psych- euthymic mood, full affect Neuro- strength and sensation are intact   EKG today demonstrates Afib Vent.  rate 73 BPM PR interval * ms QRS duration 108 ms QT/QTcB 454/500 ms   Echo 12/05/21 1. Left ventricular ejection fraction, by estimation, is 60 to 65%. The  left ventricle has normal function. The left ventricle has  no regional  wall motion abnormalities. The left ventricular internal cavity size was  mildly dilated. Left ventricular diastolic function could not be evaluated.   2. Right ventricular systolic function is normal. The right ventricular  size is normal.   3. Right atrial size was mildly dilated.   4. The mitral valve is normal in structure. Mild mitral valve regurgitation. No evidence of mitral stenosis.   5. The aortic valve is tricuspid. Aortic valve regurgitation is not  visualized. Aortic valve sclerosis/calcification is present, without any  evidence of aortic stenosis.   6. The inferior vena cava is normal in size with greater than 50%  respiratory variability, suggesting right atrial pressure of 3 mmHg.    CHA2DS2-VASc Score = 4  The patient's score is based upon: CHF History: 0 HTN History: 1 Diabetes History: 1 Stroke History: 0 Vascular Disease History: 0 Age Score: 2 Gender Score: 0       ASSESSMENT AND PLAN: 1. Paroxysmal Atrial Fibrillation (ICD10:  I48.0) The patient's CHA2DS2-VASc score is 4, indicating a 4.8% annual risk of stroke.   Previously failed flecainide with prolongation of PR Patient in rate controlled afib today but overall afib burden much improved. Will continue present therapy for now. Patient will call clinic back if he remains persistent.  Continue amiodarone 200 mg daily Check cmet/TSH today Continue Toprol 25 mg daily  Continue warfarin  2. Secondary Hypercoagulable State (ICD10:  D68.69) The patient is at significant risk for stroke/thromboembolism based upon his CHA2DS2-VASc Score of 4.  Continue Warfarin (Coumadin).    3. HTN Stable, no changes today.   Follow up in the AF clinic in 3 months.    [facility] [address] [place], [place] [address] [phone]

## 2022-10-25 ENCOUNTER — Ambulatory Visit (INDEPENDENT_AMBULATORY_CARE_PROVIDER_SITE_OTHER): Payer: Medicare HMO | Admitting: Internal Medicine

## 2022-10-25 ENCOUNTER — Encounter: Payer: Self-pay | Admitting: Internal Medicine

## 2022-10-25 VITALS — BP 162/71 | HR 57 | Temp 97.2°F | Ht 74.0 in | Wt 219.0 lb

## 2022-10-25 DIAGNOSIS — R140 Abdominal distension (gaseous): Secondary | ICD-10-CM

## 2022-10-25 DIAGNOSIS — K625 Hemorrhage of anus and rectum: Secondary | ICD-10-CM | POA: Diagnosis not present

## 2022-10-25 DIAGNOSIS — I10 Essential (primary) hypertension: Secondary | ICD-10-CM

## 2022-10-25 DIAGNOSIS — R197 Diarrhea, unspecified: Secondary | ICD-10-CM | POA: Diagnosis not present

## 2022-10-25 DIAGNOSIS — R151 Fecal smearing: Secondary | ICD-10-CM

## 2022-10-25 NOTE — Patient Instructions (Addendum)
I want you to start taking over-the-counter simethicone (Gas-X) 10 minutes before meals to help with gas or as needed for when your gas symptoms are present.  Continue Lomotil as needed for diarrhea. I refilled this today.  I will give you a low FODMAP diet which may give you some insight on certain foods to avoid.   I would try to avoid Dairy for a month and see if your symptoms improve.   I want you to do Kegal exercises at home to strengthen your pelvic floor.    Follow-up in 6 months.   It was nice seeing you again today.   Dr. Marletta Lor

## 2022-10-25 NOTE — Progress Notes (Signed)
Primary Care Physician:  Elfredia Nevins, MD Primary Gastroenterologist:  Dr. Marletta Lor  Chief Complaint  Patient presents with   Diarrhea    Having diarrhea off and on. Was Taking metamucil gummies but stopped due to it causing gas. Having more gas now than he had at last visit. Has seen some blood about 3 times when wiping since last visit.     HPI:   Jason Alvarez is a 82 y.o. male who presents to the clinic today for follow up visit.    Initially seen April 2022 for fecal smearing/incontinence.  Was previously scheduled for flexible sigmoidoscopy but cancelled as his symptoms got better.   Presents today with similar issues.  Notes occasional fecal leakage.  Also intermittent diarrhea but states his bowels have better as a whole.  Takes as needed Lomotil on bad days.  Previously evaluated by surgery who recommended conservative measures.  Denies any melena, does note scant hematochezia on toilet paper at times.  Last colonoscopy 2018 with 1 small tubular adenoma removed.  Did not mention internal hemorrhoids on this report.   Previously recommended to take Metamucil over-the-counter.  Took this for a while but states it increased his gas so he stopped.   Was also told to take Simethicone but states he did not try this. Was also given Kegal exercises to perform but states he did not do this either,    Past Medical History:  Diagnosis Date   A-fib    paf with NSVT on MCOT, on coumadin, echo 01/31/08-nl lv, mild aortic valve sclerosis    Aneurysm    Anxiety    Atrial fibrillation    Carotid bruit    doppler 05/17/12-mild plaque with no diameter reduction   Chronic anticoagulation    Chronic ITP (idiopathic thrombocytopenia) 04/10/2012   Low grade thrombocytopenia consistent with chronic ITP not in need of therapy.    DM (diabetes mellitus)    Dysrhythmia    afib   H/O cardiovascular stress test 01/31/08   no ishcemia, ef 61%   History of kidney stones    HTN (hypertension)     Hypertension    ITP (idiopathic thrombocytopenic purpura)    Thrombocytopenia     Past Surgical History:  Procedure Laterality Date   CIRCUMCISION     COLONOSCOPY  06/25/2012   Procedure: COLONOSCOPY;  Surgeon: Dalia Heading, MD;  [facility]: [facility];  Service: Gastroenterology;  Laterality: N/A;   COLONOSCOPY N/A 01/30/2017   Procedure: COLONOSCOPY;  Surgeon: Franky Macho, MD;  [facility]: [facility];  Service: Gastroenterology;  Laterality: N/A;   KNEE ARTHROSCOPY     right and left knee/1992 and 2000   POLYPECTOMY  01/30/2017   Procedure: POLYPECTOMY;  Surgeon: Franky Macho, MD;  [facility]: [facility];  Service: Gastroenterology;;  colon   VASECTOMY      Current Outpatient Medications  Medication Sig Dispense Refill   ALPRAZolam (XANAX) 1 MG tablet Taking 1 tablet by mouth 4 times daily     amiodarone (PACERONE) 200 MG tablet Take 1 tablet (200 mg total) by mouth daily. 90 tablet 1   amLODipine (NORVASC) 10 MG tablet Take 1 tablet (10 mg total) by mouth daily. 30 tablet 6   clotrimazole-betamethasone (LOTRISONE) cream Apply topically.     fluticasone (FLONASE) 50 MCG/ACT nasal spray Place into both nostrils daily.     glipiZIDE-metformin (METAGLIP) 5-500 MG per tablet Take 1 tablet by mouth 2 (two) times daily after a meal. Patient only  taking one tablet daily     hydrochlorothiazide (HYDRODIURIL) 25 MG tablet Take 25 mg by mouth daily.     metoprolol succinate (TOPROL-XL) 25 MG 24 hr tablet Take 1 tablet (25 mg total) by mouth daily. 90 tablet 1   nystatin ointment (MYCOSTATIN) Apply 1 Application topically every morning.     OneTouch Delica Lancets 33G MISC      ONETOUCH VERIO test strip 1 each 2 (two) times daily.     ramipril (ALTACE) 10 MG capsule TAKE ONE CAPSULE BY MOUTH TWICE A DAY 180 capsule 2   warfarin (COUMADIN) 5 MG tablet TAKE 1 TO 1&1/2 TABLET DAILY AS DIRECTED 120 tablet 2   gabapentin (NEURONTIN) 300 MG capsule Take 300 mg by mouth daily. (Patient not  taking: Reported on 10/25/2022)     No current facility-administered medications for this visit.    Allergies as of 10/25/2022 - Review Complete 10/25/2022  Allergen Reaction Noted   Penicillins Other (See Comments)     Family History  Problem Relation Age of Onset   Prostate cancer Father    Cancer Sister        breast   Cancer Brother        lung   Melanoma Mother     Social History   Socioeconomic History   Marital status: Married    Spouse name: Not on file   Number of children: Not on file   Years of education: Not on file   Highest education level: Not on file  Occupational History   Not on file  Tobacco Use   Smoking status: Never    Passive exposure: Never   Smokeless tobacco: Never   Tobacco comments:    Never smoke 10/18/22  Vaping Use   Vaping Use: Never used  Substance and Sexual Activity   Alcohol use: No   Drug use: No   Sexual activity: Not on file  Other Topics Concern   Not on file  Social History Narrative   Not on file   Social Determinants of Health   Financial Resource Strain: Not on file  Food Insecurity: Not on file  Transportation Needs: Not on file  Physical Activity: Not on file  Stress: Not on file  Social Connections: Not on file  Intimate Partner Violence: Not on file    Subjective: Review of Systems  Constitutional:  Negative for chills and fever.  HENT:  Negative for congestion and hearing loss.   Eyes:  Negative for blurred vision and double vision.  Respiratory:  Negative for cough and shortness of breath.   Cardiovascular:  Negative for chest pain and palpitations.  Gastrointestinal:  Positive for diarrhea. Negative for abdominal pain, blood in stool, constipation, heartburn, melena and vomiting.       Stool incontinence  Genitourinary:  Negative for dysuria and urgency.  Musculoskeletal:  Negative for joint pain and myalgias.  Skin:  Negative for itching and rash.  Neurological:  Negative for dizziness and  headaches.  Psychiatric/Behavioral:  Negative for depression. The patient is not nervous/anxious.        Objective: BP (!) 162/71 (BP Location: Left Arm, Patient Position: Sitting, Cuff Size: Large)   Pulse (!) 57   Temp (!) 97.2 F (36.2 C) (Oral)   Ht 6\' 2"  (1.88 m)   Wt 219 lb (99.3 kg)   BMI 28.12 kg/m  Physical Exam Constitutional:      Appearance: Normal appearance.  HENT:     Head: Normocephalic and atraumatic.  Eyes:     Extraocular Movements: Extraocular movements intact.     Conjunctiva/sclera: Conjunctivae normal.  Cardiovascular:     Rate and Rhythm: Normal rate and regular rhythm.  Pulmonary:     Effort: Pulmonary effort is normal.     Breath sounds: Normal breath sounds.  Abdominal:     General: Bowel sounds are normal.     Palpations: Abdomen is soft.  Musculoskeletal:        General: Normal range of motion.     Cervical back: Normal range of motion and neck supple.  Skin:    General: Skin is warm.  Neurological:     General: No focal deficit present.     Mental Status: He is alert and oriented to person, place, and time.  Psychiatric:        Mood and Affect: Mood normal.        Behavior: Behavior normal.      Assessment: *Stool incontinence/leakage *Diarrhea-intermittent *Rectal bleeding *Abdominal bloating/gas *Hypertension  Plan: Etiology of patient's symptoms likely due to poor sphincter tone.  Internal hemorrhoids might also be playing a role as well.  Metamucil made his gas worse, will stop this.    I have again printed off Kegal exercises for her to perform at home to strengthen his pelvic muscles.  Low FODMAP diet given to patient.   Discussed that internal hemorrhoids could be contributing to his leakage especially if he is having bright red blood per rectum with bowel movements.  Discussed potential hemorrhoid banding with him.  This will require flexible sigmoidoscopy to further evaluate prior.  Would like to hold off for  now.  Continue Lomotil as needed for diarrhea. Refilled today.   Patient to take over-the-counter simethicone prior to meals to see if this helps with his abdominal bloating/gas.  The patient was found to have elevated blood pressure when vital signs were checked in the office. The blood pressure was rechecked by the nursing staff, and it was found be persistently elevated >140/90 mmHg. I personally advised the patient to follow up closely with the PCP for hypertension control.  Follow-up in 6 months or sooner if needed.  10/25/2022 9:06 AM   Disclaimer: This note was dictated with voice recognition software. Similar sounding words can inadvertently be transcribed and may not be corrected upon review.

## 2022-11-06 ENCOUNTER — Ambulatory Visit: Payer: Medicare HMO | Attending: Cardiovascular Disease | Admitting: *Deleted

## 2022-11-06 DIAGNOSIS — I4891 Unspecified atrial fibrillation: Secondary | ICD-10-CM

## 2022-11-06 DIAGNOSIS — Z5181 Encounter for therapeutic drug level monitoring: Secondary | ICD-10-CM

## 2022-11-06 LAB — POCT INR: INR: 2.1 (ref 2.0–3.0)

## 2022-11-06 NOTE — Patient Instructions (Signed)
On Amiodarone  daily Continue warfarin 1/2 tablet daily except 1 tablet on Sundays, Tuesdays and Thursdays Continue greens/salad  Repeat INR in 4 weeks

## 2022-12-04 ENCOUNTER — Ambulatory Visit: Payer: Medicare HMO | Attending: Cardiovascular Disease | Admitting: *Deleted

## 2022-12-04 DIAGNOSIS — I4891 Unspecified atrial fibrillation: Secondary | ICD-10-CM | POA: Diagnosis not present

## 2022-12-04 DIAGNOSIS — Z5181 Encounter for therapeutic drug level monitoring: Secondary | ICD-10-CM | POA: Diagnosis not present

## 2022-12-04 LAB — POCT INR: INR: 2.1 (ref 2.0–3.0)

## 2022-12-04 NOTE — Patient Instructions (Signed)
On Amiodarone 200mg daily Continue warfarin 1/2 tablet daily except 1 tablet on Sundays, Tuesdays and Thursdays Continue greens/salad  Repeat INR in 4 weeks 

## 2022-12-18 ENCOUNTER — Telehealth (HOSPITAL_COMMUNITY): Payer: Self-pay | Admitting: *Deleted

## 2022-12-18 MED ORDER — METOPROLOL SUCCINATE ER 25 MG PO TB24: 12.5000 mg | ORAL_TABLET | Freq: Every day | ORAL | 1 refills | Status: DC

## 2022-12-18 NOTE — Telephone Encounter (Signed)
Patient called in stating for the last week his HR has been in the 47-52 range consistently. Pt is not symptomatic. Discussed with Jorja Loa PA will reduce metoprolol succinate to 12.5mg  once a day. Pt to call if HRs do not increase with reduction.

## 2022-12-26 ENCOUNTER — Encounter: Payer: Self-pay | Admitting: Internal Medicine

## 2023-01-01 ENCOUNTER — Ambulatory Visit: Payer: Medicare HMO | Attending: Cardiovascular Disease | Admitting: *Deleted

## 2023-01-01 DIAGNOSIS — I4891 Unspecified atrial fibrillation: Secondary | ICD-10-CM | POA: Diagnosis not present

## 2023-01-01 DIAGNOSIS — Z5181 Encounter for therapeutic drug level monitoring: Secondary | ICD-10-CM | POA: Diagnosis not present

## 2023-01-01 LAB — POCT INR: INR: 1.9 — AB (ref 2.0–3.0)

## 2023-01-01 NOTE — Patient Instructions (Signed)
On Amiodarone 200mg  daily Take warfarin 1 tablet tonight then continue 1/2 tablet daily except 1 tablet on Sundays, Tuesdays and Thursdays Continue greens/salad  Repeat INR in 4 weeks

## 2023-01-05 ENCOUNTER — Telehealth: Payer: Self-pay

## 2023-01-05 DIAGNOSIS — I83893 Varicose veins of bilateral lower extremities with other complications: Secondary | ICD-10-CM

## 2023-01-05 DIAGNOSIS — M7989 Other specified soft tissue disorders: Secondary | ICD-10-CM

## 2023-01-05 DIAGNOSIS — I739 Peripheral vascular disease, unspecified: Secondary | ICD-10-CM

## 2023-01-05 NOTE — Telephone Encounter (Signed)
Caller: Patient  Concern: BLE swelling, top of feet, L 2nd toe  Pt states that the swelling is significantly decreased or gone in the morning after having his legs elevated during the night, but quickly returns after being active. He denies any sore, discoloration, coldness, or numbness.   Location: left leg, right leg  Description:  Since March 2024  Treatments:  elevation, compression, exercise  Resolution: Appointment scheduled for ABI and MD  Next Appt: Appointment scheduled for 6/26 at 1500.

## 2023-01-10 ENCOUNTER — Ambulatory Visit (INDEPENDENT_AMBULATORY_CARE_PROVIDER_SITE_OTHER): Payer: Medicare HMO

## 2023-01-10 ENCOUNTER — Encounter: Payer: Self-pay | Admitting: Vascular Surgery

## 2023-01-10 ENCOUNTER — Ambulatory Visit: Payer: Medicare HMO | Admitting: Vascular Surgery

## 2023-01-10 VITALS — BP 184/76 | HR 79 | Temp 98.4°F | Ht 74.0 in | Wt 215.2 lb

## 2023-01-10 DIAGNOSIS — I83893 Varicose veins of bilateral lower extremities with other complications: Secondary | ICD-10-CM

## 2023-01-10 DIAGNOSIS — M7989 Other specified soft tissue disorders: Secondary | ICD-10-CM

## 2023-01-10 NOTE — Progress Notes (Signed)
[facility] [place]  Patient name: Jason Alvarez MRN: [id number] DOB: December 30, 1940 Sex: male  REASON FOR VISIT: Evaluation lower extremity swelling  HPI: Jason Alvarez is a 82 y.o. male here today for evaluation of lower extremity swelling.  He is known to our practice from prior right leg great saphenous vein ablation many years ago.  He does have moderate arterial insufficiency with noncompressible lower extremity vessels but no critical limb ischemia.  He called reporting increasing swelling and concern regarding this.  Past Medical History:  Diagnosis Date   A-fib (HCC)    paf with NSVT on MCOT, on coumadin, echo 01/31/08-nl lv, mild aortic valve sclerosis    Aneurysm (HCC)    Anxiety    Atrial fibrillation (HCC)    Carotid bruit    doppler 05/17/12-mild plaque with no diameter reduction   Chronic anticoagulation    Chronic ITP (idiopathic thrombocytopenia) (HCC) 04/10/2012   Low grade thrombocytopenia consistent with chronic ITP not in need of therapy.    DM (diabetes mellitus) (HCC)    Dysrhythmia    afib   H/O cardiovascular stress test 01/31/08   no ishcemia, ef 61%   History of kidney stones    HTN (hypertension)    Hypertension    ITP (idiopathic thrombocytopenic purpura)    Thrombocytopenia (HCC)     Family History  Problem Relation Age of Onset   Prostate cancer Father    Cancer Sister        breast   Cancer Brother        lung   Melanoma Mother     SOCIAL HISTORY: Social History   Tobacco Use   Smoking status: Never    Passive exposure: Never   Smokeless tobacco: Never   Tobacco comments:    Never smoke 10/18/22  Substance Use Topics   Alcohol use: No    Allergies  Allergen Reactions   Penicillins Other (See Comments)    Childhood allergy Has patient had a PCN reaction causing immediate rash, facial/tongue/throat swelling, SOB or lightheadedness with hypotension: Unknown Has patient had  a PCN reaction causing severe rash involving mucus membranes or skin necrosis: Unknown Has patient had a PCN reaction that required hospitalization: Unknown Has patient had a PCN reaction occurring within the last 10 years: Unknown If all of the above answers are "NO", then may proceed with Cephalosporin use.    Current Outpatient Medications  Medication Sig Dispense Refill   ALPRAZolam (XANAX) 1 MG tablet Taking 1 tablet by mouth 4 times daily     amiodarone (PACERONE) 200 MG tablet Take 1 tablet (200 mg total) by mouth daily. 90 tablet 1   amLODipine (NORVASC) 10 MG tablet Take 1 tablet (10 mg total) by mouth daily. 30 tablet 6   clotrimazole-betamethasone (LOTRISONE) cream Apply topically.     fluticasone (FLONASE) 50 MCG/ACT nasal spray Place into both nostrils daily.     gabapentin (NEURONTIN) 300 MG capsule Take 300 mg by mouth daily.     glipiZIDE-metformin (METAGLIP) 5-500 MG per tablet Take 1 tablet by mouth 2 (two) times daily after a meal. Patient only taking one tablet daily     hydrochlorothiazide (HYDRODIURIL) 25 MG tablet Take 25 mg by mouth daily.     metoprolol succinate (TOPROL-XL) 25 MG 24 hr tablet Take 0.5 tablets (12.5 mg total) by mouth daily. 45 tablet 1   nystatin ointment (MYCOSTATIN) Apply 1 Application topically every morning.     OneTouch Delica  Lancets 33G MISC      ONETOUCH VERIO test strip 1 each 2 (two) times daily.     ramipril (ALTACE) 10 MG capsule TAKE ONE CAPSULE BY MOUTH TWICE A DAY 180 capsule 2   warfarin (COUMADIN) 5 MG tablet TAKE 1 TO 1&1/2 TABLET DAILY AS DIRECTED 120 tablet 2   furosemide (LASIX) 20 MG tablet Take 20 mg by mouth daily. (Patient not taking: Reported on 01/10/2023)     No current facility-administered medications for this visit.    REVIEW OF SYSTEMS:  [X]  denotes positive finding, [ ]  denotes negative finding Cardiac  Comments:  Chest pain or chest pressure:    Shortness of breath upon exertion:    Short of breath when lying  flat:    Irregular heart rhythm: x       Vascular    Pain in calf, thigh, or hip brought on by ambulation:    Pain in feet at night that wakes you up from your sleep:     Blood clot in your veins:    Leg swelling:  x         PHYSICAL EXAM: Vitals:   01/10/23 1544  BP: (!) 184/76  Pulse: 79  Temp: 98.4 F (36.9 C)  SpO2: 98%  Weight: 215 lb 3.2 oz (97.6 kg)  Height: 6\' 2"  (1.88 m)    GENERAL: The patient is a well-nourished male, in no acute distress. The vital signs are documented above. CARDIOVASCULAR: I do not palpate pedal pulses bilaterally.  He has bilateral leg swelling worse on the right than on the left with pitting edema PULMONARY: There is good air exchange  MUSCULOSKELETAL: There are no major deformities or cyanosis. NEUROLOGIC: No focal weakness or paresthesias are detected. SKIN: There are no ulcers or rashes noted. PSYCHIATRIC: The patient has a normal affect.  DATA:  Lower extremity venous studies today reveal no evidence of DVT  MEDICAL ISSUES: I explained to the patient that he does have fluid overload most likely related to venous hypertension and possibly cardiac issues as well.  He does have Lasix for as needed use and I have encouraged him to take these.  He also has thigh-high compression garments which she is having a difficult time wearing.  I feel that he would be appropriate for knee-high 20 to 30 mmHg compression.  He was reassured with this discussion and will see [place] again on an as-needed basis    Larina Earthly, MD FACS [facility] Office Tel [phone]  Note: Portions of this report may have been transcribed using voice recognition software.  Every effort has been made to ensure accuracy; however, inadvertent computerized transcription errors may still be present.

## 2023-01-10 NOTE — Addendum Note (Signed)
Addended by: Leilani Able, Javan Gonzaga A on: 01/10/2023 08:05 AM   Modules accepted: Orders

## 2023-01-17 ENCOUNTER — Inpatient Hospital Stay (HOSPITAL_COMMUNITY)
Admission: RE | Admit: 2023-01-17 | Discharge: 2023-01-17 | Disposition: A | Discharge status: Home / Self Care | Payer: Medicare HMO | Source: Ambulatory Visit | Attending: Physician Assistant | Admitting: Physician Assistant

## 2023-01-17 ENCOUNTER — Encounter (HOSPITAL_COMMUNITY): Payer: Self-pay | Admitting: Physician Assistant

## 2023-01-17 ENCOUNTER — Ambulatory Visit (HOSPITAL_COMMUNITY)
Admission: RE | Admit: 2023-01-17 | Discharge: 2023-01-17 | Disposition: A | Discharge status: Home / Self Care | Payer: Medicare HMO | Source: Ambulatory Visit | Attending: Physician Assistant | Admitting: Physician Assistant

## 2023-01-17 VITALS — BP 154/68 | HR 67 | Ht 74.0 in | Wt 220.8 lb

## 2023-01-17 DIAGNOSIS — D6869 Other thrombophilia: Secondary | ICD-10-CM | POA: Diagnosis not present

## 2023-01-17 DIAGNOSIS — Z79899 Other long term (current) drug therapy: Secondary | ICD-10-CM | POA: Insufficient documentation

## 2023-01-17 DIAGNOSIS — E119 Type 2 diabetes mellitus without complications: Secondary | ICD-10-CM | POA: Insufficient documentation

## 2023-01-17 DIAGNOSIS — Z7901 Long term (current) use of anticoagulants: Secondary | ICD-10-CM | POA: Diagnosis not present

## 2023-01-17 DIAGNOSIS — D693 Immune thrombocytopenic purpura: Secondary | ICD-10-CM | POA: Insufficient documentation

## 2023-01-17 DIAGNOSIS — I480 Paroxysmal atrial fibrillation: Secondary | ICD-10-CM

## 2023-01-17 DIAGNOSIS — I4819 Other persistent atrial fibrillation: Secondary | ICD-10-CM

## 2023-01-17 DIAGNOSIS — I10 Essential (primary) hypertension: Secondary | ICD-10-CM | POA: Diagnosis not present

## 2023-01-17 DIAGNOSIS — R600 Localized edema: Secondary | ICD-10-CM | POA: Insufficient documentation

## 2023-01-17 LAB — BASIC METABOLIC PANEL
Anion gap: 9 (ref 5–15)
BUN: 22 mg/dL (ref 8–23)
CO2: 26 mmol/L (ref 22–32)
Calcium: 9 mg/dL (ref 8.9–10.3)
Chloride: 104 mmol/L (ref 98–111)
Creatinine, Ser: 1.3 mg/dL — ABNORMAL HIGH (ref 0.61–1.24)
GFR, Estimated: 55 mL/min — ABNORMAL LOW (ref >60)
Glucose, Bld: 165 mg/dL — ABNORMAL HIGH (ref 70–99)
Potassium: 3.7 mmol/L (ref 3.5–5.1)
Sodium: 139 mmol/L (ref 135–145)

## 2023-01-17 NOTE — Addendum Note (Signed)
Encounter addended by: Danice Goltz, PA on: 01/17/2023 2:07 PM  Actions taken: Clinical Note Signed

## 2023-01-17 NOTE — Progress Notes (Addendum)
Primary Care Physician: Elfredia Nevins, MD Referring Physician: prior pt of Dr. Megan Mans is a 82 y.o. male with a h/o atrial fibrillation, DM, HTN, ITP, thrombocytopenia who presents for follow up in the [facility]. Patient is on warfarin for a CHADS2VASc score of 4. He was initially on flecainide but had significant PR prolongation and was transitioned to amiodarone.   On follow up today, patient reports that recently he has been feeling "off-balance", leg weakness, and lower extremity edema. He has been feeling this way for several weeks. He is in afib today. He has been prescribed Lasix by his PCP but has not started this yet. No bleeding issues on anticoagulation.   Today, negative for palpitations, chest pain, shortness of breath, orthopnea, PND, presyncope, syncope, or neurologic sequela. The patient is tolerating medications without difficulties and is otherwise without complaint today.   Past Medical History:  Diagnosis Date   A-fib (HCC)    paf with NSVT on MCOT, on coumadin, echo 01/31/08-nl lv, mild aortic valve sclerosis    Aneurysm (HCC)    Anxiety    Atrial fibrillation (HCC)    Carotid bruit    doppler 05/17/12-mild plaque with no diameter reduction   Chronic anticoagulation    Chronic ITP (idiopathic thrombocytopenia) (HCC) 04/10/2012   Low grade thrombocytopenia consistent with chronic ITP not in need of therapy.    DM (diabetes mellitus) (HCC)    Dysrhythmia    afib   H/O cardiovascular stress test 01/31/08   no ishcemia, ef 61%   History of kidney stones    HTN (hypertension)    Hypertension    ITP (idiopathic thrombocytopenic purpura)    Thrombocytopenia (HCC)     ROS- All systems are reviewed and negative except as per the HPI above  Physical Exam: Vitals:   01/17/23 0815  Weight: 100.2 kg  Height: 6\' 2"  (1.88 m)    GEN: Well nourished, well developed in no acute distress NECK: No JVD; No carotid  bruits CARDIAC: Irregularly irregular rate and rhythm, no murmurs, rubs, gallops RESPIRATORY:  Clear to auscultation without rales, wheezing or rhonchi  ABDOMEN: Soft, non-tender, non-distended EXTREMITIES:  1-2+ bilateral edema, No deformity    EKG today demonstrates Afib  Vent. rate 67 BPM PR interval * ms QRS duration 110 ms QT/QTcB 456/481 ms   Echo 12/05/21 1. Left ventricular ejection fraction, by estimation, is 60 to 65%. The  left ventricle has normal function. The left ventricle has no regional  wall motion abnormalities. The left ventricular internal cavity size was  mildly dilated. Left ventricular diastolic function could not be evaluated.   2. Right ventricular systolic function is normal. The right ventricular  size is normal.   3. Right atrial size was mildly dilated.   4. The mitral valve is normal in structure. Mild mitral valve regurgitation. No evidence of mitral stenosis.   5. The aortic valve is tricuspid. Aortic valve regurgitation is not  visualized. Aortic valve sclerosis/calcification is present, without any  evidence of aortic stenosis.   6. The inferior vena cava is normal in size with greater than 50%  respiratory variability, suggesting right atrial pressure of 3 mmHg.    CHA2DS2-VASc Score = 4  The patient's score is based upon: CHF History: 0 HTN History: 1 Diabetes History: 1 Stroke History: 0 Vascular Disease History: 0 Age Score: 2 Gender Score: 0       ASSESSMENT AND PLAN: Persistent  atrial fibrillation  The patient's CHA2DS2-VASc score is 4, indicating a 4.8% annual risk of stroke.   Previously failed flecainide with prolongation of PR I suspect he has been persistently in afib. Will have him wear a Zio monitor to confirm. If he is persistent, will arrange for DCCV. If he is paroxysmal with a high burden, will revisit rate vs rhythm control. He does not feel he can be away from his ill wife long enough to have an ablation or be loaded  on dofetilide.  Continue amiodarone 200 mg daily Continue Toprol 12.5 mg daily  Continue warfarin  Secondary Hypercoagulable State (ICD10:  D68.69) The patient is at significant risk for stroke/thromboembolism based upon his CHA2DS2-VASc Score of 4.  Continue Warfarin (Coumadin).    HTN Stable on current regimen  Lower extremity edema Seen by Dr Arbie Cookey 6/26, no evidence of DVT. ? Related to persistent afib Encouraged him to start his Lasix 20 mg daily Check bmet today   Follow up pending monitor results.    Jorja Loa PA-C [facility] [address] [place], [place] [address] [phone]

## 2023-01-25 ENCOUNTER — Other Ambulatory Visit (HOSPITAL_COMMUNITY): Payer: Self-pay | Admitting: *Deleted

## 2023-01-25 ENCOUNTER — Inpatient Hospital Stay (HOSPITAL_COMMUNITY)
Admission: RE | Admit: 2023-01-25 | Discharge: 2023-01-25 | Disposition: A | Discharge status: Home / Self Care | Payer: Medicare HMO | Source: Ambulatory Visit | Attending: Physician Assistant | Admitting: Physician Assistant

## 2023-01-25 DIAGNOSIS — I480 Paroxysmal atrial fibrillation: Secondary | ICD-10-CM

## 2023-01-29 ENCOUNTER — Ambulatory Visit: Payer: Medicare HMO | Attending: Cardiovascular Disease | Admitting: *Deleted

## 2023-01-29 DIAGNOSIS — Z5181 Encounter for therapeutic drug level monitoring: Secondary | ICD-10-CM | POA: Diagnosis not present

## 2023-01-29 DIAGNOSIS — I4891 Unspecified atrial fibrillation: Secondary | ICD-10-CM | POA: Diagnosis not present

## 2023-01-29 LAB — POCT INR: INR: 2.3 (ref 2.0–3.0)

## 2023-01-29 NOTE — Patient Instructions (Signed)
On Amiodarone 200mg  daily Continue 1/2 tablet daily except 1 tablet on Sundays, Tuesdays and Thursdays Continue greens/salad  Repeat INR in 4 weeks

## 2023-02-21 NOTE — Addendum Note (Signed)
Encounter addended by: Shona Simpson, RN on: 02/21/2023 2:01 PM  Actions taken: Imaging Exam ended

## 2023-02-25 ENCOUNTER — Other Ambulatory Visit (HOSPITAL_COMMUNITY): Payer: Self-pay | Admitting: Physician Assistant

## 2023-02-26 ENCOUNTER — Ambulatory Visit: Payer: Medicare HMO | Attending: Cardiovascular Disease | Admitting: *Deleted

## 2023-02-26 DIAGNOSIS — I4891 Unspecified atrial fibrillation: Secondary | ICD-10-CM | POA: Diagnosis not present

## 2023-02-26 DIAGNOSIS — Z5181 Encounter for therapeutic drug level monitoring: Secondary | ICD-10-CM

## 2023-02-26 LAB — POCT INR: INR: 1.8 — AB (ref 2.0–3.0)

## 2023-02-26 NOTE — Patient Instructions (Signed)
On Amiodarone 200mg  daily Take warfarin 1 tablet tonight then increase dose to 1 tablet daily except 1/2 tablet on Mondays, Wednesdays and Fridays Continue greens/salad  Repeat INR in 4 weeks

## 2023-03-08 ENCOUNTER — Ambulatory Visit: Payer: Medicare HMO | Admitting: Urology

## 2023-03-16 ENCOUNTER — Encounter (HOSPITAL_COMMUNITY): Payer: Self-pay | Admitting: *Deleted

## 2023-03-27 ENCOUNTER — Ambulatory Visit: Payer: Medicare HMO | Attending: Cardiovascular Disease | Admitting: *Deleted

## 2023-03-27 DIAGNOSIS — I4891 Unspecified atrial fibrillation: Secondary | ICD-10-CM

## 2023-03-27 DIAGNOSIS — Z5181 Encounter for therapeutic drug level monitoring: Secondary | ICD-10-CM

## 2023-03-27 LAB — POCT INR: INR: 3.4 — AB (ref 2.0–3.0)

## 2023-03-27 NOTE — Patient Instructions (Signed)
On Amiodarone 200mg  daily Hold warfarin tonight then resume 1 tablet daily except 1/2 tablet on Mondays, Wednesdays and Fridays Continue greens/salad  Repeat INR in 3 weeks

## 2023-04-13 ENCOUNTER — Other Ambulatory Visit (HOSPITAL_COMMUNITY): Payer: Self-pay | Admitting: *Deleted

## 2023-04-13 MED ORDER — AMIODARONE HCL 200 MG PO TABS: 200.0000 mg | ORAL_TABLET | Freq: Every day | ORAL | 2 refills | Status: DC

## 2023-04-17 ENCOUNTER — Ambulatory Visit: Payer: Medicare HMO | Attending: Cardiovascular Disease | Admitting: *Deleted

## 2023-04-17 DIAGNOSIS — Z5181 Encounter for therapeutic drug level monitoring: Secondary | ICD-10-CM

## 2023-04-17 DIAGNOSIS — I4891 Unspecified atrial fibrillation: Secondary | ICD-10-CM

## 2023-04-17 LAB — POCT INR: INR: 3.8 — AB (ref 2.0–3.0)

## 2023-04-17 NOTE — Patient Instructions (Signed)
On Amiodarone 200mg  daily Hold warfarin tonight then decrease dose to 1/2 tablet daily except 1 tablet on Tuesdays, Thursdays and Saturdays Continue greens/salad  Repeat INR in 3 weeks

## 2023-04-26 ENCOUNTER — Ambulatory Visit: Payer: Medicare HMO | Admitting: Internal Medicine

## 2023-04-26 ENCOUNTER — Ambulatory Visit: Payer: Medicare HMO | Admitting: Urology

## 2023-05-08 ENCOUNTER — Ambulatory Visit: Payer: Medicare HMO | Attending: Cardiovascular Disease | Admitting: *Deleted

## 2023-05-08 DIAGNOSIS — I4891 Unspecified atrial fibrillation: Secondary | ICD-10-CM | POA: Diagnosis not present

## 2023-05-08 DIAGNOSIS — Z5181 Encounter for therapeutic drug level monitoring: Secondary | ICD-10-CM | POA: Diagnosis not present

## 2023-05-08 LAB — POCT INR: INR: 2.6 (ref 2.0–3.0)

## 2023-05-08 NOTE — Patient Instructions (Signed)
On Amiodarone 200mg  daily Continue warfarin 1/2 tablet daily except 1 tablet on Tuesdays, Thursdays and Saturdays Continue greens/salad  Repeat INR in 4 weeks

## 2023-05-16 ENCOUNTER — Encounter (HOSPITAL_COMMUNITY): Payer: Self-pay | Admitting: Physician Assistant

## 2023-05-16 ENCOUNTER — Ambulatory Visit (HOSPITAL_COMMUNITY)
Admission: RE | Admit: 2023-05-16 | Discharge: 2023-05-16 | Disposition: A | Discharge status: Home / Self Care | Payer: Medicare HMO | Source: Ambulatory Visit | Attending: Physician Assistant | Admitting: Physician Assistant

## 2023-05-16 VITALS — BP 148/88 | HR 67 | Ht 74.0 in | Wt 215.8 lb

## 2023-05-16 DIAGNOSIS — Z79899 Other long term (current) drug therapy: Secondary | ICD-10-CM | POA: Insufficient documentation

## 2023-05-16 DIAGNOSIS — I10 Essential (primary) hypertension: Secondary | ICD-10-CM | POA: Insufficient documentation

## 2023-05-16 DIAGNOSIS — Z7901 Long term (current) use of anticoagulants: Secondary | ICD-10-CM | POA: Diagnosis not present

## 2023-05-16 DIAGNOSIS — D6869 Other thrombophilia: Secondary | ICD-10-CM | POA: Diagnosis not present

## 2023-05-16 DIAGNOSIS — Z5181 Encounter for therapeutic drug level monitoring: Secondary | ICD-10-CM | POA: Insufficient documentation

## 2023-05-16 DIAGNOSIS — I480 Paroxysmal atrial fibrillation: Secondary | ICD-10-CM

## 2023-05-16 DIAGNOSIS — R9431 Abnormal electrocardiogram [ECG] [EKG]: Secondary | ICD-10-CM | POA: Diagnosis not present

## 2023-05-16 DIAGNOSIS — I4819 Other persistent atrial fibrillation: Secondary | ICD-10-CM | POA: Diagnosis not present

## 2023-05-16 LAB — CBC
HCT: 35.4 % — ABNORMAL LOW (ref 39.0–52.0)
Hemoglobin: 11.2 g/dL — ABNORMAL LOW (ref 13.0–17.0)
MCH: 30.9 pg (ref 26.0–34.0)
MCHC: 31.6 g/dL (ref 30.0–36.0)
MCV: 97.5 fL (ref 80.0–100.0)
Platelets: 155 10*3/uL (ref 150–400)
RBC: 3.63 MIL/uL — ABNORMAL LOW (ref 4.22–5.81)
RDW: 14 % (ref 11.5–15.5)
WBC: 6.8 10*3/uL (ref 4.0–10.5)
nRBC: 0 % (ref 0.0–0.2)

## 2023-05-16 LAB — COMPREHENSIVE METABOLIC PANEL
ALT: 15 U/L (ref 0–44)
AST: 16 U/L (ref 15–41)
Albumin: 3.5 g/dL (ref 3.5–5.0)
Alkaline Phosphatase: 70 U/L (ref 38–126)
Anion gap: 8 (ref 5–15)
BUN: 23 mg/dL (ref 8–23)
CO2: 27 mmol/L (ref 22–32)
Calcium: 8.9 mg/dL (ref 8.9–10.3)
Chloride: 104 mmol/L (ref 98–111)
Creatinine, Ser: 1.41 mg/dL — ABNORMAL HIGH (ref 0.61–1.24)
GFR, Estimated: 50 mL/min — ABNORMAL LOW (ref >60)
Glucose, Bld: 132 mg/dL — ABNORMAL HIGH (ref 70–99)
Potassium: 3.6 mmol/L (ref 3.5–5.1)
Sodium: 139 mmol/L (ref 135–145)
Total Bilirubin: 0.9 mg/dL (ref 0.3–1.2)
Total Protein: 6.9 g/dL (ref 6.5–8.1)

## 2023-05-16 LAB — TSH: TSH: 3.85 u[IU]/mL (ref 0.350–4.500)

## 2023-05-16 NOTE — Patient Instructions (Addendum)
Stop amiodarone   Metoprolol to 1/2 tablet once a day

## 2023-05-16 NOTE — Progress Notes (Signed)
Primary Care Physician: Elfredia Nevins, MD Referring Physician: prior pt of Dr. Megan Mans is a 82 y.o. male with a h/o atrial fibrillation, DM, HTN, ITP, thrombocytopenia who presents for follow up in the [facility]. Patient is on warfarin for a CHADS2VASc score of 4. He was initially on flecainide but had significant PR prolongation and was transitioned to amiodarone. Patient wore a Zio monitor 02/2023 which showed 25% afib burden, rate controlled.  On follow up today, patient reports that he has felt unbalanced since starting amiodarone. He is in afib today but not very symptomatic. No bleeding issues on anticoagulation.   Today, negative for palpitations, chest pain, shortness of breath, orthopnea, PND, presyncope, syncope, or neurologic sequela. The patient is tolerating medications without difficulties and is otherwise without complaint today.   Past Medical History:  Diagnosis Date   A-fib (HCC)    paf with NSVT on MCOT, on coumadin, echo 01/31/08-nl lv, mild aortic valve sclerosis    Aneurysm (HCC)    Anxiety    Atrial fibrillation (HCC)    Carotid bruit    doppler 05/17/12-mild plaque with no diameter reduction   Chronic anticoagulation    Chronic ITP (idiopathic thrombocytopenia) (HCC) 04/10/2012   Low grade thrombocytopenia consistent with chronic ITP not in need of therapy.    DM (diabetes mellitus) (HCC)    Dysrhythmia    afib   H/O cardiovascular stress test 01/31/08   no ishcemia, ef 61%   History of kidney stones    HTN (hypertension)    Hypertension    ITP (idiopathic thrombocytopenic purpura)    Thrombocytopenia (HCC)     ROS- All systems are reviewed and negative except as per the HPI above  Physical Exam: Vitals:   05/16/23 0824  BP: (!) 148/88  Pulse: 67  Weight: 97.9 kg  Height: 6\' 2"  (1.88 m)     GEN: Well nourished, well developed in no acute distress NECK: No JVD; No carotid bruits CARDIAC:  Irregularly irregular rate and rhythm, no murmurs, rubs, gallops RESPIRATORY:  Clear to auscultation without rales, wheezing or rhonchi  ABDOMEN: Soft, non-tender, non-distended EXTREMITIES:  No edema; No deformity    EKG today demonstrates Afib Vent. rate 67 BPM PR interval * ms QRS duration 108 ms QT/QTcB 458/483 ms   Echo 12/05/21 1. Left ventricular ejection fraction, by estimation, is 60 to 65%. The  left ventricle has normal function. The left ventricle has no regional  wall motion abnormalities. The left ventricular internal cavity size was  mildly dilated. Left ventricular diastolic function could not be evaluated.   2. Right ventricular systolic function is normal. The right ventricular  size is normal.   3. Right atrial size was mildly dilated.   4. The mitral valve is normal in structure. Mild mitral valve regurgitation. No evidence of mitral stenosis.   5. The aortic valve is tricuspid. Aortic valve regurgitation is not  visualized. Aortic valve sclerosis/calcification is present, without any  evidence of aortic stenosis.   6. The inferior vena cava is normal in size with greater than 50%  respiratory variability, suggesting right atrial pressure of 3 mmHg.    CHA2DS2-VASc Score = 4  The patient's score is based upon: CHF History: 0 HTN History: 1 Diabetes History: 1 Stroke History: 0 Vascular Disease History: 0 Age Score: 2 Gender Score: 0       ASSESSMENT AND PLAN: Persistent atrial fibrillation  The patient's CHA2DS2-VASc  score is 4, indicating a 4.8% annual risk of stroke.   Previously failed flecainide with prolongation of PR Zio monitor showed 25% afib burden We discussed rate vs rhythm control today. Patient would like a trial off amiodarone to see if his balance improves. We discussed dofetilide and ablation. He does not think he can take time away from his chronically ill wife but will consider at follow up.  Check cmet/TSH/cbc today Continue  Toprol 12.5 mg daily (patient previously taking only 1/4 pill)  Continue warfarin  Secondary Hypercoagulable State (ICD10:  D68.69) The patient is at significant risk for stroke/thromboembolism based upon his CHA2DS2-VASc Score of 4.  Continue Warfarin (Coumadin).    HTN Mildly elevated, increase BB as above.     Follow up in the AF clinic in 3 months.    Jorja Loa PA-C [facility] [address] [place], [place] [address] [phone]

## 2023-05-17 ENCOUNTER — Ambulatory Visit: Payer: Medicare HMO | Admitting: Urology

## 2023-05-17 ENCOUNTER — Encounter: Payer: Self-pay | Admitting: Urology

## 2023-05-17 VITALS — BP 157/60 | HR 63

## 2023-05-17 DIAGNOSIS — R972 Elevated prostate specific antigen [PSA]: Secondary | ICD-10-CM

## 2023-05-17 DIAGNOSIS — R351 Nocturia: Secondary | ICD-10-CM | POA: Diagnosis not present

## 2023-05-17 DIAGNOSIS — R3129 Other microscopic hematuria: Secondary | ICD-10-CM

## 2023-05-17 DIAGNOSIS — N401 Enlarged prostate with lower urinary tract symptoms: Secondary | ICD-10-CM

## 2023-05-17 DIAGNOSIS — N138 Other obstructive and reflux uropathy: Secondary | ICD-10-CM | POA: Diagnosis not present

## 2023-05-17 LAB — URINALYSIS, ROUTINE W REFLEX MICROSCOPIC
Bilirubin, UA: NEGATIVE
Leukocytes,UA: NEGATIVE
Nitrite, UA: NEGATIVE
Specific Gravity, UA: 1.025 (ref 1.005–1.030)
Urobilinogen, Ur: 1 mg/dL (ref 0.2–1.0)
pH, UA: 6 (ref 5.0–7.5)

## 2023-05-17 LAB — MICROSCOPIC EXAMINATION

## 2023-05-17 LAB — BLADDER SCAN AMB NON-IMAGING: Scan Result: 0

## 2023-05-17 NOTE — Progress Notes (Signed)
post void residual=0 ?

## 2023-06-05 ENCOUNTER — Ambulatory Visit: Payer: Medicare HMO | Attending: Cardiovascular Disease | Admitting: *Deleted

## 2023-06-05 DIAGNOSIS — I4891 Unspecified atrial fibrillation: Secondary | ICD-10-CM

## 2023-06-05 DIAGNOSIS — Z5181 Encounter for therapeutic drug level monitoring: Secondary | ICD-10-CM | POA: Diagnosis not present

## 2023-06-05 LAB — POCT INR: INR: 2.9 (ref 2.0–3.0)

## 2023-06-05 NOTE — Patient Instructions (Signed)
On Amiodarone 200mg  daily Continue warfarin 1/2 tablet daily except 1 tablet on Tuesdays, Thursdays and Saturdays Continue greens/salad  Repeat INR in 4 weeks

## 2023-07-03 ENCOUNTER — Ambulatory Visit: Payer: Medicare HMO | Attending: Cardiovascular Disease | Admitting: *Deleted

## 2023-07-03 DIAGNOSIS — I4891 Unspecified atrial fibrillation: Secondary | ICD-10-CM

## 2023-07-03 DIAGNOSIS — Z5181 Encounter for therapeutic drug level monitoring: Secondary | ICD-10-CM

## 2023-07-03 LAB — POCT INR: INR: 2.7 (ref 2.0–3.0)

## 2023-07-03 NOTE — Patient Instructions (Signed)
On Amiodarone 200mg  daily Continue warfarin 1/2 tablet daily except 1 tablet on Tuesdays, Thursdays and Saturdays Continue greens/salad  Repeat INR in 6 weeks

## 2023-07-04 ENCOUNTER — Telehealth (HOSPITAL_COMMUNITY): Payer: Self-pay

## 2023-07-04 DIAGNOSIS — I10 Essential (primary) hypertension: Secondary | ICD-10-CM

## 2023-07-04 NOTE — Telephone Encounter (Signed)
Patient called having issues with his blood pressure. Systolic pressure ranging from 156-172 and diastolic 70 range. Informed patient we are not able to manage his high blood pressure. Per Clint Fenton-PA patient will need to be referred to the Hypertension Clinic to manage his blood pressure. Referral has been placed. Patient is aware they will reach out to him to schedule his appointment. Consulted with patient and he verbalized understanding.

## 2023-08-01 ENCOUNTER — Other Ambulatory Visit (HOSPITAL_COMMUNITY): Payer: Self-pay | Admitting: Physician Assistant

## 2023-08-01 ENCOUNTER — Other Ambulatory Visit: Payer: Self-pay | Admitting: Cardiovascular Disease

## 2023-08-01 NOTE — Telephone Encounter (Signed)
 Refill request for warfarin:  Last INR was 2.7 on 07/03/23 Next INR is 08/14/23 LOV was 01/23/23  Refill approved.

## 2023-08-06 ENCOUNTER — Telehealth: Payer: Self-pay | Admitting: Urology

## 2023-08-06 NOTE — Telephone Encounter (Signed)
Had psa at Dr Sherwood Gambler and it is 11 he is concerned and does not want to wait on an appointment.

## 2023-08-07 NOTE — Telephone Encounter (Signed)
Patient stopped in today to let you know that he had his blood work done today at [facility] , he is going to have them send you a copy of the labs .

## 2023-08-08 NOTE — Telephone Encounter (Signed)
FYI

## 2023-08-08 NOTE — Telephone Encounter (Signed)
Patient called again today Dr Sherwood Gambler office told him his PSA was a 15, but all his other labs were ok.

## 2023-08-09 ENCOUNTER — Encounter: Payer: Self-pay | Admitting: Urology

## 2023-08-09 ENCOUNTER — Ambulatory Visit: Payer: Medicare HMO | Admitting: Urology

## 2023-08-09 ENCOUNTER — Ambulatory Visit (HOSPITAL_BASED_OUTPATIENT_CLINIC_OR_DEPARTMENT_OTHER): Payer: Medicare HMO | Admitting: Family

## 2023-08-09 VITALS — BP 166/90 | HR 76

## 2023-08-09 VITALS — BP 149/70 | HR 79 | Ht 74.0 in | Wt 207.0 lb

## 2023-08-09 DIAGNOSIS — R3129 Other microscopic hematuria: Secondary | ICD-10-CM | POA: Diagnosis not present

## 2023-08-09 DIAGNOSIS — R972 Elevated prostate specific antigen [PSA]: Secondary | ICD-10-CM

## 2023-08-09 DIAGNOSIS — D6859 Other primary thrombophilia: Secondary | ICD-10-CM

## 2023-08-09 DIAGNOSIS — N401 Enlarged prostate with lower urinary tract symptoms: Secondary | ICD-10-CM

## 2023-08-09 DIAGNOSIS — I10 Essential (primary) hypertension: Secondary | ICD-10-CM | POA: Diagnosis not present

## 2023-08-09 DIAGNOSIS — R351 Nocturia: Secondary | ICD-10-CM | POA: Diagnosis not present

## 2023-08-09 DIAGNOSIS — I4819 Other persistent atrial fibrillation: Secondary | ICD-10-CM | POA: Diagnosis not present

## 2023-08-09 DIAGNOSIS — N138 Other obstructive and reflux uropathy: Secondary | ICD-10-CM

## 2023-08-09 LAB — URINALYSIS, ROUTINE W REFLEX MICROSCOPIC
Bilirubin, UA: NEGATIVE
Glucose, UA: NEGATIVE
Ketones, UA: NEGATIVE
Leukocytes,UA: NEGATIVE
Nitrite, UA: NEGATIVE
Specific Gravity, UA: 1.025 (ref 1.005–1.030)
Urobilinogen, Ur: 0.2 mg/dL (ref 0.2–1.0)
pH, UA: 6 (ref 5.0–7.5)

## 2023-08-09 LAB — MICROSCOPIC EXAMINATION: Bacteria, UA: NONE SEEN

## 2023-08-09 MED ORDER — OLMESARTAN MEDOXOMIL 20 MG PO TABS: 20.0000 mg | ORAL_TABLET | Freq: Every day | ORAL | 2 refills | Status: DC

## 2023-08-09 NOTE — Progress Notes (Signed)
Advanced Hypertension Clinic Initial Assessment:    Date:  08/12/2023   ID:  Jason Alvarez, DOB [birth year], MRN [id number]  PCP:  Elfredia Nevins, MD  Cardiologist:  None  Nephrologist:  Referring MD: Danice Goltz, PA   CC: Hypertension  History of Present Illness:    Jason Alvarez is a 83 y.o. male with a hx of persistent atrial fibrillation, hypertension here to establish care in the Advanced Hypertension Clinic.   Jason Alvarez was diagnosed with hypertension > 60 years ago and reports being diagnosed at age 12 or 83 years old. It has been difficult to control. Blood pressure checked with arm cuff at home. Readings have been 138/75, 143/71, 158/68, 146/83. he reports tobacco use never.  For exercise he enjoys walking and stays active around the house. he eats at home and outside of the home and does follow low sodium diet.   Reports no chest pain, pressure, tightness. Reports no shortness of breath at rest nor exertional dyspnea. He will feel lightheaded only if he changes positions very quickly. This occurs very rarely. Reports no snoring, no daytime somnolence. Prior sleep study with no OSA.   November 18, 2025his wife passed away suddenly from a stroke which he witnessed. Condolences offered. He has two sons who are 92 and 57 years old who live near him.   Takes his antihypertensive regimen including Amlodipine, Metoprolol, Ramipril, hydrochlorothiazide in the morning. He takes a second Ramipril every evening. Since stopping Amiodarone feels his balance is better. Previously on Flecainide but had to be stopped for PR prolongation.    Previous antihypertensives:    Past Medical History:  Diagnosis Date   A-fib (HCC)    paf with NSVT on MCOT, on coumadin, echo 01/31/08-nl lv, mild aortic valve sclerosis    Aneurysm (HCC)    Anxiety    Atrial fibrillation (HCC)    Carotid bruit    doppler 05/17/12-mild plaque with no diameter reduction   Chronic anticoagulation     Chronic ITP (idiopathic thrombocytopenia) (HCC) 04/10/2012   Low grade thrombocytopenia consistent with chronic ITP not in need of therapy.    DM (diabetes mellitus) (HCC)    Dysrhythmia    afib   H/O cardiovascular stress test 01/31/08   no ishcemia, ef 61%   History of kidney stones    HTN (hypertension)    Hypertension    ITP (idiopathic thrombocytopenic purpura)    Thrombocytopenia (HCC)     Past Surgical History:  Procedure Laterality Date   CIRCUMCISION     COLONOSCOPY  06/25/2012   Procedure: COLONOSCOPY;  Surgeon: Dalia Heading, MD;  [facility];  Service: Gastroenterology;  Laterality: N/A;   COLONOSCOPY N/A 01/30/2017   Procedure: COLONOSCOPY;  Surgeon: Franky Macho, MD;  Location: [facility];  Service: Gastroenterology;  Laterality: N/A;   KNEE ARTHROSCOPY     right and left knee/1992 and 2000   POLYPECTOMY  01/30/2017   Procedure: POLYPECTOMY;  Surgeon: Franky Macho, MD;  Location: [facility];  Service: Gastroenterology;;  colon   VASECTOMY      Current Medications: Current Meds  Medication Sig   ALPRAZolam (XANAX) 1 MG tablet Taking 1 tablet by mouth 4 times daily   amLODipine (NORVASC) 10 MG tablet Take 1 tablet (10 mg total) by mouth daily.   azithromycin (ZITHROMAX) 250 MG tablet Take 250 mg by mouth as directed.   diphenoxylate-atropine (LOMOTIL) 2.5-0.025 MG tablet Take by mouth as needed for diarrhea  or loose stools.   fluticasone (FLONASE) 50 MCG/ACT nasal spray Place into both nostrils as needed.   glipiZIDE-metformin (METAGLIP) 5-500 MG per tablet Take 1 tablet by mouth daily. Patient only taking one tablet daily   hydrochlorothiazide (HYDRODIURIL) 25 MG tablet Take 25 mg by mouth daily.   methylPREDNISolone (MEDROL DOSEPAK) 4 MG TBPK tablet as needed (inflammation).   metoprolol succinate (TOPROL-XL) 25 MG 24 hr tablet TAKE 1/2 TABLET BY MOUTH ONCE DAILY   olmesartan (BENICAR) 20 MG tablet Take 1 tablet (20 mg total) by mouth daily.    OneTouch Delica Lancets 33G MISC    ONETOUCH VERIO test strip 1 each 2 (two) times daily.   warfarin (COUMADIN) 5 MG tablet TAKE 1/2 TO 1 TABLET DAILY AS DIRECTED   [DISCONTINUED] ramipril (ALTACE) 10 MG capsule TAKE ONE CAPSULE BY MOUTH TWICE A DAY     Allergies:   Penicillins   Social History   Socioeconomic History   Marital status: Married    Spouse name: Not on file   Number of children: Not on file   Years of education: Not on file   Highest education level: Not on file  Occupational History   Not on file  Tobacco Use   Smoking status: Never    Passive exposure: Never   Smokeless tobacco: Never   Tobacco comments:    Never smoke 10/18/22  Vaping Use   Vaping status: Never Used  Substance and Sexual Activity   Alcohol use: No   Drug use: No   Sexual activity: Not on file  Other Topics Concern   Not on file  Social History Narrative   Not on file   Social Drivers of Health   Financial Resource Strain: Not on file  Food Insecurity: Not on file  Transportation Needs: Not on file  Physical Activity: Not on file  Stress: Not on file  Social Connections: Not on file     Family History: The patient's family history includes Cancer in his brother and sister; Melanoma in his mother; Prostate cancer in his father.  ROS:   Please see the history of present illness.     All other systems reviewed and are negative.  EKGs/Labs/Other Studies Reviewed:         Recent Labs: 05/16/2023: ALT 15; BUN 23; Creatinine, Ser 1.41; Hemoglobin 11.2; Platelets 155; Potassium 3.6; Sodium 139; TSH 3.850   Recent Lipid Panel    Component Value Date/Time   CHOL 163 04/08/2019 0758   TRIG 65 04/08/2019 0758   HDL 45 04/08/2019 0758   LDLCALC 105 (H) 04/08/2019 0758    Physical Exam:   VS:  BP (!) 149/70 (BP Location: Left Arm)   Pulse 79   Ht 6\' 2"  (1.88 m)   Wt 207 lb (93.9 kg)   SpO2 97%   BMI 26.58 kg/m  , BMI Body mass index is 26.58 kg/m. GENERAL:  Well  appearing HEENT: Pupils equal round and reactive, fundi not visualized, oral mucosa unremarkable NECK:  No jugular venous distention, waveform within normal limits, carotid upstroke brisk and symmetric, no bruits, no thyromegaly LYMPHATICS:  No cervical adenopathy LUNGS:  Clear to auscultation bilaterally HEART:  IRIR.  PMI not displaced or sustained,S1 and S2 within normal limits, no S3, no S4, no clicks, no rubs, no murmurs ABD:  Flat, positive bowel sounds normal in frequency in pitch, no bruits, no rebound, no guarding, no midline pulsatile mass, no hepatomegaly, no splenomegaly EXT:  2 plus pulses throughout, no  edema, no cyanosis no clubbing SKIN:  No rashes no nodules NEURO:  Cranial nerves II through XII grossly intact, motor grossly intact throughout PSYCH:  Cognitively intact, oriented to person place and time   ASSESSMENT/PLAN:    HTN - BP not at goal <130/80. Continue Amlodipine 10mg  daily, hydrochlorothiazide 25mg  daily, toprol 12.m5 gdaily.  Stop Ramipril. Start Olmesartan 20mg  daily. Can further up-titrate at follow up. Could also transition to combo Amlodipine-Olmesartan-hydrochlorothiazide.  Discussed to monitor BP at home at least 2 hours after medications and sitting for 5-10 minutes.  Will request BMP, renin-aldosterone to be drawn at upcoming Atrial Fib Clinic appointment Persistent atrial fibrillation / Hypercoagulabe state - Continue Toprol 12.5mg  daily and Warfarin. Follows with atrial fibrillation clinic.  DM2 - Continue to follow with PCP.   Screening for Secondary Hypertension:     Relevant Labs/Studies:    Latest Ref Rng & Units 05/16/2023   10:10 AM 01/17/2023    9:26 AM 10/18/2022    9:10 AM  Basic Labs  Sodium 135 - 145 mmol/L 139  139  139   Potassium 3.5 - 5.1 mmol/L 3.6  3.7  3.4   Creatinine 0.61 - 1.24 mg/dL 0.45  4.09  8.11        Latest Ref Rng & Units 05/16/2023   10:16 AM 10/18/2022    8:20 AM  Thyroid   TSH 0.350 - 4.500 uIU/mL 3.850  3.968                     Disposition:    FU with MD/APP/PharmD in 2-3 months    Medication Adjustments/Labs and Tests Ordered: Current medicines are reviewed at length with the patient today.  Concerns regarding medicines are outlined above.  No orders of the defined types were placed in this encounter.  Meds ordered this encounter  Medications   olmesartan (BENICAR) 20 MG tablet    Sig: Take 1 tablet (20 mg total) by mouth daily.    Dispense:  30 tablet    Refill:  2    Discontinue Ramipril.    Supervising Provider:   Jodelle Red [[id number]]     Signed, Alver Sorrow, NP  08/12/2023 8:00 PM    [facility]

## 2023-08-09 NOTE — Progress Notes (Signed)
Subjective:  1. Elevated PSA   2. BPH with urinary obstruction   3. Nocturia   4. Microhematuria      08/09/23: Jason Alvarez returns in f/u with a recent PSA of 15 on 08/01/23.  He lost his wife in November from an acute stroke.  He had been married for 56 years.  He has BPH with BOO and had a low risk MRI in 2017 with a volume of .   He has stable LUTS.  He has nocturia x 2-3.  He has had no flank pain or hematuria.  His Cr was 1.37 on 1/15.  UA today is 3-10 RBC's. He had a URI with laryngitis about a month ago.  He was given antibiotics but didn't take them.   10/31/24[name] returns today in f/u.   He has a history of BPH with BOO and renal stones with prior hematuria.   He had an elevated PSA with the last stable at 7.5 in 2/23. We decided to forgo further testing after last year.   His IPSS is 12 with a reduced stream.  He has minimal burning with voiding.  His UA has 3-10 RBC's which is chronic.  He has had no flank pain.  His Cr was 1.41 on10/30/24.  He was taken off of amiodarone yesterday for weakness.  He remains on warfarin.  His PVR is minimal.   03/16/22: Jason Alvarez returns today in f/u.  He had a hematuria w/u last year and was found to have tiny renal stones and prostate enlargement.  He has still not tried the finasteride or tamsulosin.  He has had some increased frequency over the last week.  His IPSS today is 20.  He has a reduced stream.  His UA today has 3-10 RBC's.  His last PSA was 7.5 in 2/23 which was stable.  He has 1+ glucose but is off of farxiga.   03/17/21: Jason Alvarez returns today in f/u from a CT hematuria study for microhematuria noted in April.  He was found to have punctate renal stones and a large prostate but no other significant GU findings.   He was given tamsulosin at his last visit but he has never started that.  He has previously declined finasteride.   He has had a lot on his plate dealing with his wife.  His UA still has 3-10 RBC's.  His IPSS is stable at 17 with a weak  stream and a sensation of incomplete emptying.  He has been started on Farxiga and has 2+ glucose.  His Cr was 1.1.     11/04/20: Jason Alvarez returns today in f/u for his several year history of BPH with BOO and an elevated PSA. His PSA is 7.5 which is fairly stable.  It was 6.1 in 11/26/18.  It is 8.6 with a 34% f/t ratio on 12/04/19 which is in his usual range.  He had a prostate MRI in the fall of 2017 that was negative but his prostate was very large. He was given a script for finasteride but he never started it.  His IPSS is 17 which is stable.  He has an episode of enuresis recently but that hasn't recurred.  He has no associated signs or symptoms.  Cr is 1.27 on 09/13/20.   Jason Alvarez has had occasional recurrent balanitis but it has responded to nystatin and hasn't had to use it recently.            ROS:  ROS:  A complete review of  systems was performed.  All systems are negative except for pertinent findings as noted.   Review of Systems  HENT:  Positive for congestion.   Cardiovascular:  Positive for leg swelling.  Endo/Heme/Allergies:  Bruises/bleeds easily.  All other systems reviewed and are negative.   Allergies  Allergen Reactions   Penicillins Other (See Comments)    Childhood allergy Has patient had a PCN reaction causing immediate rash, facial/tongue/throat swelling, SOB or lightheadedness with hypotension: Unknown Has patient had a PCN reaction causing severe rash involving mucus membranes or skin necrosis: Unknown Has patient had a PCN reaction that required hospitalization: Unknown Has patient had a PCN reaction occurring within the last 10 years: Unknown If all of the above answers are "NO", then may proceed with Cephalosporin use.    Outpatient Encounter Medications as of 08/09/2023  Medication Sig Note   ALPRAZolam (XANAX) 1 MG tablet Taking 1 tablet by mouth 4 times daily    amLODipine (NORVASC) 10 MG tablet Take 1 tablet (10 mg total) by mouth daily.     fluticasone (FLONASE) 50 MCG/ACT nasal spray Place into both nostrils as needed.    glipiZIDE-metformin (METAGLIP) 5-500 MG per tablet Take 1 tablet by mouth daily. Patient only taking one tablet daily    hydrochlorothiazide (HYDRODIURIL) 25 MG tablet Take 25 mg by mouth daily.    metoprolol succinate (TOPROL-XL) 25 MG 24 hr tablet TAKE 1/2 TABLET BY MOUTH ONCE DAILY    OneTouch Delica Lancets 33G MISC     ONETOUCH VERIO test strip 1 each 2 (two) times daily.    warfarin (COUMADIN) 5 MG tablet TAKE 1/2 TO 1 TABLET DAILY AS DIRECTED    [DISCONTINUED] ramipril (ALTACE) 10 MG capsule TAKE ONE CAPSULE BY MOUTH TWICE A DAY    [DISCONTINUED] clotrimazole-betamethasone (LOTRISONE) cream Apply topically as needed. (Patient not taking: Reported on 08/09/2023) 07/26/2022: prn   [DISCONTINUED] furosemide (LASIX) 20 MG tablet Take 20 mg by mouth daily. (Patient not taking: Reported on 08/09/2023)    [DISCONTINUED] gabapentin (NEURONTIN) 300 MG capsule Take 300 mg by mouth daily. (Patient not taking: Reported on 08/09/2023)    [DISCONTINUED] nystatin ointment (MYCOSTATIN) Apply 1 Application topically every morning. (Patient not taking: Reported on 08/09/2023)    No facility-administered encounter medications on file as of 08/09/2023.    Past Medical History:  Diagnosis Date   A-fib (HCC)    paf with NSVT on MCOT, on coumadin, echo 01/31/08-nl lv, mild aortic valve sclerosis    Aneurysm (HCC)    Anxiety    Atrial fibrillation (HCC)    Carotid bruit    doppler 05/17/12-mild plaque with no diameter reduction   Chronic anticoagulation    Chronic ITP (idiopathic thrombocytopenia) (HCC) 04/10/2012   Low grade thrombocytopenia consistent with chronic ITP not in need of therapy.    DM (diabetes mellitus) (HCC)    Dysrhythmia    afib   H/O cardiovascular stress test 01/31/08   no ishcemia, ef 61%   History of kidney stones    HTN (hypertension)    Hypertension    ITP (idiopathic thrombocytopenic purpura)     Thrombocytopenia (HCC)     Past Surgical History:  Procedure Laterality Date   CIRCUMCISION     COLONOSCOPY  06/25/2012   Procedure: COLONOSCOPY;  Surgeon: Dalia Heading, MD;  [facility];  Service: Gastroenterology;  Laterality: N/A;   COLONOSCOPY N/A 01/30/2017   Procedure: COLONOSCOPY;  Surgeon: Franky Macho, MD;  Location[facility];  Service: Gastroenterology;  Laterality: N/A;   KNEE ARTHROSCOPY     right and left knee/1992 and 2000   POLYPECTOMY  01/30/2017   Procedure: POLYPECTOMY;  Surgeon: Franky Macho, MD;  [facility];  Service: Gastroenterology;;  colon   VASECTOMY      Social History   Socioeconomic History   Marital status: Married    Spouse name: Not on file   Number of children: Not on file   Years of education: Not on file   Highest education level: Not on file  Occupational History   Not on file  Tobacco Use   Smoking status: Never    Passive exposure: Never   Smokeless tobacco: Never   Tobacco comments:    Never smoke 10/18/22  Vaping Use   Vaping status: Never Used  Substance and Sexual Activity   Alcohol use: No   Drug use: No   Sexual activity: Not on file  Other Topics Concern   Not on file  Social History Narrative   Not on file   Social Drivers of Health   Financial Resource Strain: Not on file  Food Insecurity: Not on file  Transportation Needs: Not on file  Physical Activity: Not on file  Stress: Not on file  Social Connections: Not on file  Intimate Partner Violence: Not on file    Family History  Problem Relation Age of Onset   Prostate cancer Father    Cancer Sister        breast   Cancer Brother        lung   Melanoma Mother        Objective: Vitals:   08/09/23 0858  BP: (!) 166/90  Pulse: 76      Physical Exam Vitals reviewed.  Constitutional:      Appearance: Normal appearance.  Genitourinary:    Comments: AP without lesions. NST without mass. Prostate 2.5+ benign. SV  non-palpable.  Neurological:     Mental Status: He is alert.     Lab Results:  Results for orders placed or performed in visit on 08/09/23 (from the past 24 hours)  Urinalysis, Routine w reflex microscopic     Status: Abnormal   Collection Time: 08/09/23  9:06 AM  Result Value Ref Range   Specific Gravity, UA 1.025 1.005 - 1.030   pH, UA 6.0 5.0 - 7.5   Color, UA Yellow Yellow   Appearance Ur Clear Clear   Leukocytes,UA Negative Negative   Protein,UA 2+ (A) Negative/Trace   Glucose, UA Negative Negative   Ketones, UA Negative Negative   RBC, UA 1+ (A) Negative   Bilirubin, UA Negative Negative   Urobilinogen, Ur 0.2 0.2 - 1.0 mg/dL   Nitrite, UA Negative Negative   Microscopic Examination See below:    Narrative   Performed at:  [address] - [facility] [address], [place], [place]  [id number] Lab Director: Chinita Pester MT, Phone:  [phone]  Microscopic Examination     Status: Abnormal   Collection Time: 08/09/23  9:06 AM   Urine  Result Value Ref Range   WBC, UA 0-5 0 - 5 /hpf   RBC, Urine 3-10 (A) 0 - 2 /hpf   Epithelial Cells (non renal) 0-10 0 - 10 /hpf   Casts Present (A) None seen /lpf   Cast Type Granular casts (A) N/A   Bacteria, UA None seen None seen/Few   Narrative   Performed at:  01 - [facility] [address] [place], [place]  [id number] Lab Director: Chinita Pester MT, Phone:  [phone]       BMET No results for input(s): "NA", "K", "CL", "CO2", "GLUCOSE", "BUN", "CREATININE", "CALCIUM" in the last 72 hours.  PSA   PSA was 15 on 07/22/23 Lab Results  Component Value Date   PSA1 7.5 (H) 09/01/2021   PSA1 7.5 (H) 06/09/2020    PSA  Date Value Ref Range Status  12/04/2019 8.6  Final  11/26/2018 6.1  Final   No results found for: "TESTOSTERONE"  UA reviewed.  3-10 RBC's.   Results for orders placed or performed in visit on 08/09/23 (from the past 24 hours)  Urinalysis, Routine w reflex microscopic     Status: Abnormal    Collection Time: 08/09/23  9:06 AM  Result Value Ref Range   Specific Gravity, UA 1.025 1.005 - 1.030   pH, UA 6.0 5.0 - 7.5   Color, UA Yellow Yellow   Appearance Ur Clear Clear   Leukocytes,UA Negative Negative   Protein,UA 2+ (A) Negative/Trace   Glucose, UA Negative Negative   Ketones, UA Negative Negative   RBC, UA 1+ (A) Negative   Bilirubin, UA Negative Negative   Urobilinogen, Ur 0.2 0.2 - 1.0 mg/dL   Nitrite, UA Negative Negative   Microscopic Examination See below:    Narrative   Performed at:  [address] - [facility] [address] [place], [place]  [id number] Lab Director: Chinita Pester MT, Phone:  [phone]  Microscopic Examination     Status: Abnormal   Collection Time: 08/09/23  9:06 AM   Urine  Result Value Ref Range   WBC, UA 0-5 0 - 5 /hpf   RBC, Urine 3-10 (A) 0 - 2 /hpf   Epithelial Cells (non renal) 0-10 0 - 10 /hpf   Casts Present (A) None seen /lpf   Cast Type Granular casts (A) N/A   Bacteria, UA None seen None seen/Few   Narrative   Performed at:  [address] - [facility] [address], [place], [place]  [id number] Lab Director: Chinita Pester MT, Phone:  [phone]        Studies/Results: No results found. Previous records and MRI reviewed.   Assessment & Plan: Elevated PSA.  His PSA is up to 15 from 7.5 in 2/23.  He had  URI a few weeks prior.   I will repeat the PSA in 1 month.  If it remains up, I will get an MRI but if it is down, I will repeat in 3 months.   BPH with BOO.  He has  Stable LUTS with a history of an elevated PSA.   He never took the finasteride or the tamsulosin.   I have tried to get him on meds for the prostate but will not push the issue anymore.     Microhematuria.  The prior CT just showed tiny renal stones.    Cystoscopy suggesteda prostatic source of bleeding.  He has 3-10 RBC's today.     No orders of the defined types were placed in this encounter.    Orders Placed This Encounter  Procedures    Microscopic Examination   Urinalysis, Routine w reflex microscopic   PSA, total and free    Standing Status:   Future    Expected Date:   09/09/2023    Expiration Date:   11/07/2023   PSA, total and free    Standing Status:   Future    Expected Date:   11/07/2023  Expiration Date:   02/06/2024      Return in about 3 months (around 11/07/2023) for PSA in 1 month and 3 months. .   CC: Elfredia Nevins, MD      Bjorn Pippin 08/10/2023 Patient ID: Wyatt Haste, male   DOB: 02-Jan-1941, 83 y.o.   MRN: [id number]

## 2023-08-09 NOTE — Patient Instructions (Addendum)
Medication Instructions:   STOP Ramipril  START Olmesartan 20mg  daily   If you feel well and your blood pressure is well controlled, we could consider putting your Olmesartan and Amlodipine  If your blood pressure is still high we will consider increasing the dose.  *If you need a refill on your cardiac medications before your next appointment, please call your pharmacy*  Lab Work: We will do blood work 08/16/23 at the Atrial Fibrillation Clinic. You do not need to fast for this blood work.  Follow-Up: At [facility], you and your health needs are our priority.  As part of our continuing mission to provide you with exceptional heart care, we have created designated Provider Care Teams.  These Care Teams include your primary Cardiologist (physician) and Advanced Practice Providers (APPs -  Physician Assistants and Nurse Practitioners) who all work together to provide you with the care you need, when you need it.  We recommend signing up for the patient portal called "MyChart".  Sign up information is provided on this After Visit Summary.  MyChart is used to connect with patients for Virtual Visits (Telemedicine).  Patients are able to view lab/test results, encounter notes, upcoming appointments, etc.  Non-urgent messages can be sent to your provider as well.   To learn more about what you can do with MyChart, go to [email or web address].    Your next appointment:   In 2-3 months in Hypertension Clinic with Dr. Duke Salvia, Alver Sorrow, NP, or Phillips Hay, [facility]  Other Instructions  Please check your blood pressure once per day at least an hour after taking your medications.   Tips to Measure your Blood Pressure Correctly  Here's what you can do to ensure a correct reading:  Don't drink a caffeinated beverage or smoke during the 30 minutes before the test.  Sit quietly for five minutes before the test begins.  During the measurement, sit in a chair with your feet on the  floor and your arm supported so your elbow is at about heart level.  The inflatable part of the cuff should completely cover at least 80% of your upper arm, and the cuff should be placed on bare skin, not over a shirt.  Don't talk during the measurement.   Blood pressure categories  Blood pressure category SYSTOLIC (upper number)  DIASTOLIC (lower number)  Normal Less than 120 mm Hg and Less than 80 mm Hg  Elevated 120-129 mm Hg and Less than 80 mm Hg  High blood pressure: Stage 1 hypertension 130-139 mm Hg or 80-89 mm Hg  High blood pressure: Stage 2 hypertension 140 mm Hg or higher or 90 mm Hg or higher  Hypertensive crisis (consult your doctor immediately) Higher than 180 mm Hg and/or Higher than 120 mm Hg  Source: American Heart Association and American Stroke Association. For more on getting your blood pressure under control, buy Controlling Your Blood Pressure, a Special Health Report from [facility].   Blood Pressure Log   Date   Time  Blood Pressure  Example: Nov 1 9 AM 124/78

## 2023-08-12 ENCOUNTER — Encounter (HOSPITAL_BASED_OUTPATIENT_CLINIC_OR_DEPARTMENT_OTHER): Payer: Self-pay | Admitting: Family

## 2023-08-14 ENCOUNTER — Ambulatory Visit: Payer: Medicare HMO | Attending: Cardiovascular Disease | Admitting: *Deleted

## 2023-08-14 DIAGNOSIS — Z5181 Encounter for therapeutic drug level monitoring: Secondary | ICD-10-CM | POA: Diagnosis not present

## 2023-08-14 DIAGNOSIS — I4891 Unspecified atrial fibrillation: Secondary | ICD-10-CM

## 2023-08-14 LAB — POCT INR: INR: 1.7 — AB (ref 2.0–3.0)

## 2023-08-14 NOTE — Patient Instructions (Signed)
On Amiodarone 200mg  daily Take warfarin 1 1/2 tablets tonight, 1 tablet tomorrow night then resume 1/2 tablet daily except 1 tablet on Tuesdays, Thursdays and Saturdays Continue greens/salad  Repeat INR in 3 weeks

## 2023-08-16 ENCOUNTER — Ambulatory Visit (HOSPITAL_COMMUNITY)
Admission: RE | Admit: 2023-08-16 | Discharge: 2023-08-16 | Disposition: A | Discharge status: Home / Self Care | Payer: Medicare HMO | Source: Ambulatory Visit | Attending: Physician Assistant | Admitting: Physician Assistant

## 2023-08-16 VITALS — BP 158/64 | HR 63 | Ht 74.0 in | Wt 207.0 lb

## 2023-08-16 DIAGNOSIS — Z7901 Long term (current) use of anticoagulants: Secondary | ICD-10-CM | POA: Diagnosis not present

## 2023-08-16 DIAGNOSIS — D6869 Other thrombophilia: Secondary | ICD-10-CM | POA: Diagnosis not present

## 2023-08-16 DIAGNOSIS — I10 Essential (primary) hypertension: Secondary | ICD-10-CM

## 2023-08-16 DIAGNOSIS — I4819 Other persistent atrial fibrillation: Secondary | ICD-10-CM | POA: Diagnosis present

## 2023-08-16 LAB — BASIC METABOLIC PANEL
Anion gap: 10 (ref 5–15)
BUN: 36 mg/dL — ABNORMAL HIGH (ref 8–23)
CO2: 27 mmol/L (ref 22–32)
Calcium: 9.2 mg/dL (ref 8.9–10.3)
Chloride: 103 mmol/L (ref 98–111)
Creatinine, Ser: 1.56 mg/dL — ABNORMAL HIGH (ref 0.61–1.24)
GFR, Estimated: 44 mL/min — ABNORMAL LOW (ref >60)
Glucose, Bld: 145 mg/dL — ABNORMAL HIGH (ref 70–99)
Potassium: 3.9 mmol/L (ref 3.5–5.1)
Sodium: 140 mmol/L (ref 135–145)

## 2023-08-16 NOTE — Progress Notes (Signed)
Primary Care Physician: Elfredia Nevins, MD Referring Physician: prior pt of Dr. Megan Mans is a 83 y.o. male with a h/o atrial fibrillation, DM, HTN, ITP, thrombocytopenia who presents for follow up in the [facility]. Patient is on warfarin for a CHADS2VASc score of 4. He was initially on flecainide but had significant PR prolongation and was transitioned to amiodarone. Patient wore a Zio monitor 02/2023 which showed 25% afib burden, rate controlled. Amiodarone was discontinued 05/16/23, patient reported feeling "unbalanced" since starting the medication. His wife of 56 years passed away 05-28-2022.  Patient returns for follow up for atrial fibrillation. He reports that he feels well from a cardiac standpoint. He is in rate controlled afib today but completely unaware of his arrhythmia. No bleeding issues on anticoagulation.   Today, he denies symptoms of palpitations, chest pain, orthopnea, PND, lower extremity edema, dizziness, presyncope, syncope, snoring, daytime somnolence, bleeding, or neurologic sequela. The patient is tolerating medications without difficulties and is otherwise without complaint today.    Past Medical History:  Diagnosis Date   A-fib (HCC)    paf with NSVT on MCOT, on coumadin, echo 01/31/08-nl lv, mild aortic valve sclerosis    Aneurysm (HCC)    Anxiety    Atrial fibrillation (HCC)    Carotid bruit    doppler 05/17/12-mild plaque with no diameter reduction   Chronic anticoagulation    Chronic ITP (idiopathic thrombocytopenia) (HCC) 04/10/2012   Low grade thrombocytopenia consistent with chronic ITP not in need of therapy.    DM (diabetes mellitus) (HCC)    Dysrhythmia    afib   H/O cardiovascular stress test 01/31/08   no ishcemia, ef 61%   History of kidney stones    HTN (hypertension)    Hypertension    ITP (idiopathic thrombocytopenic purpura)    Thrombocytopenia (HCC)     ROS- All systems are reviewed and  negative except as per the HPI above  Physical Exam: Vitals:   08/16/23 0825  BP: (!) 158/64  Pulse: 63  Weight: 93.9 kg  Height: 6\' 2"  (1.88 m)     GEN: Well nourished, well developed in no acute distress CARDIAC: Irregularly irregular rate and rhythm, no murmurs, rubs, gallops RESPIRATORY:  Clear to auscultation without rales, wheezing or rhonchi  ABDOMEN: Soft, non-tender, non-distended EXTREMITIES:  No edema; No deformity    EKG today demonstrates Afib Vent. rate 63 BPM PR interval * ms QRS duration 106 ms QT/QTcB 454/464 ms   Echo 12/05/21 1. Left ventricular ejection fraction, by estimation, is 60 to 65%. The  left ventricle has normal function. The left ventricle has no regional  wall motion abnormalities. The left ventricular internal cavity size was  mildly dilated. Left ventricular diastolic function could not be evaluated.   2. Right ventricular systolic function is normal. The right ventricular  size is normal.   3. Right atrial size was mildly dilated.   4. The mitral valve is normal in structure. Mild mitral valve regurgitation. No evidence of mitral stenosis.   5. The aortic valve is tricuspid. Aortic valve regurgitation is not  visualized. Aortic valve sclerosis/calcification is present, without any  evidence of aortic stenosis.   6. The inferior vena cava is normal in size with greater than 50%  respiratory variability, suggesting right atrial pressure of 3 mmHg.    CHA2DS2-VASc Score = 4  The patient's score is based upon: CHF History: 0 HTN History: 1 Diabetes History: 1  Stroke History: 0 Vascular Disease History: 0 Age Score: 2 Gender Score: 0       ASSESSMENT AND PLAN: Persistent Atrial Fibrillation (ICD10:  I48.19) The patient's CHA2DS2-VASc score is 4, indicating a 4.8% annual risk of stroke.   Previously failed flecainide due to prolonged PR Amiodarone discontinued 05/16/23 due to side effects (balance) We discussed rate vs rhythm  control (dofetilide, ablation). Given his age, paucity of symptoms, and good rate control, patient would like to pursue a conservative rate control strategy.  Continue Toprol 12.5 mg daily Continue warfarin  Secondary Hypercoagulable State (ICD10:  D68.69) The patient is at significant risk for stroke/thromboembolism based upon his CHA2DS2-VASc Score of 4.  Continue Warfarin (Coumadin). No bleeding issues.   HTN Elevated today, medications recently adjusted at his visit on 1/23. Bmet and rennin aldosterone ratio drawn today.    Follow up with Gillian Shields as scheduled. AF clinic as needed.    Jorja Loa PA-C [facility] [address] [place], [place] [address] [phone]

## 2023-08-22 ENCOUNTER — Telehealth (HOSPITAL_BASED_OUTPATIENT_CLINIC_OR_DEPARTMENT_OTHER): Payer: Self-pay | Admitting: Family

## 2023-08-22 DIAGNOSIS — I10 Essential (primary) hypertension: Secondary | ICD-10-CM

## 2023-08-22 NOTE — Telephone Encounter (Addendum)
 Spoke with patient regarding blood pressure readings Changed to Olmesartn 20 mg 1/23 Blood pressure running 140's-160's/60's-80's Readings are after taking medication and throughout the day  When questioned about vision stated it is same as always, not sure it is blurry  might just be light coming in the window  2/1 last time was in the 130's  Does sit and rest prior to taking blood pressure, thinks he may get stressed over elevated blood pressure readings  Wife passed away couple months ago   Per patient he does not eat right or drink water  Advised patient to make sure he is drinking water  and eating low sodium as much as he can.  Patient admitted to not cooking since his wife passed, eats more meals that are easy/convenient.   Had patient check blood pressure again, down to 153/83 Machine is about 83 years old and has not had checked to compare with office machine. Does [place] close to fire dept, he will take machine by there tomorrow to check for accuracy   Will forward to Caitlin for review

## 2023-08-22 NOTE — Telephone Encounter (Signed)
 Pt c/o BP issue: STAT if pt c/o blurred vision, one-sided weakness or slurred speech  1. What are your last 5 BP readings? 148/67 pulse 77, 138/63 pulse 68,  152/85 pulse 71, 159/80 pulse 77,  158/71 pulse 81 161/78 pulse 76 161/81 pulse 80  2. Are you having any other symptoms (ex. Dizziness, headache, blurred vision, passed out)? Little blurred vision  3. What is your BP issue? Blood pressure is up a little

## 2023-08-23 NOTE — Telephone Encounter (Signed)
 Labs from 1/30 show mildly increased creatinine, recommend he increase oral hydration. Increase olmesartan  to 40mg  daily. Repeat BMP in 1 week.   Tyshun Tuckerman S Rowyn Mustapha, NP

## 2023-08-23 NOTE — Telephone Encounter (Signed)
 Left message to call back

## 2023-08-29 MED ORDER — OLMESARTAN MEDOXOMIL 40 MG PO TABS: 40.0000 mg | ORAL_TABLET | Freq: Every day | ORAL | 1 refills | Status: DC

## 2023-08-29 NOTE — Telephone Encounter (Signed)
Pt calling back

## 2023-08-29 NOTE — Telephone Encounter (Signed)
Called and spoke to patient. Made patient aware of medications adjustment. Patient will double up on Olmesartan 20 mg until refill is needed. Lab slips mailed to patient. He verbalized understanding.

## 2023-08-30 LAB — ALDOSTERONE + RENIN ACTIVITY W/ RATIO
ALDO / PRA Ratio: 2.2 (ref 0.0–30.0)
Aldosterone: 4.9 ng/dL (ref 0.0–30.0)
PRA LC/MS/MS: 2.273 ng/mL/h (ref 0.167–5.380)

## 2023-08-31 ENCOUNTER — Encounter (HOSPITAL_BASED_OUTPATIENT_CLINIC_OR_DEPARTMENT_OTHER): Payer: Self-pay

## 2023-09-04 ENCOUNTER — Ambulatory Visit: Payer: Medicare HMO | Attending: Cardiovascular Disease | Admitting: *Deleted

## 2023-09-04 DIAGNOSIS — I4891 Unspecified atrial fibrillation: Secondary | ICD-10-CM | POA: Diagnosis not present

## 2023-09-04 DIAGNOSIS — Z5181 Encounter for therapeutic drug level monitoring: Secondary | ICD-10-CM

## 2023-09-04 LAB — POCT INR: INR: 1.5 — AB (ref 2.0–3.0)

## 2023-09-04 NOTE — Patient Instructions (Signed)
On Amiodarone 200mg  daily Take warfarin 2 tablets tonight then increase dose to 1 tablet daily except 1/2 tablet on Mondays and Thursdays Continue greens/salad  Repeat INR in 1 week

## 2023-09-10 ENCOUNTER — Institutional Professional Consult (permissible substitution) (HOSPITAL_BASED_OUTPATIENT_CLINIC_OR_DEPARTMENT_OTHER): Payer: Medicare HMO | Admitting: Cardiovascular Disease

## 2023-09-11 ENCOUNTER — Other Ambulatory Visit: Payer: Medicare HMO

## 2023-09-11 DIAGNOSIS — R972 Elevated prostate specific antigen [PSA]: Secondary | ICD-10-CM

## 2023-09-12 LAB — PSA, TOTAL AND FREE
PSA, Free Pct: 33.4 %
PSA, Free: 3.17 ng/mL
Prostate Specific Ag, Serum: 9.5 ng/mL — ABNORMAL HIGH (ref 0.0–4.0)

## 2023-09-13 ENCOUNTER — Ambulatory Visit: Payer: Medicare HMO | Attending: Cardiovascular Disease | Admitting: *Deleted

## 2023-09-13 DIAGNOSIS — Z5181 Encounter for therapeutic drug level monitoring: Secondary | ICD-10-CM | POA: Diagnosis not present

## 2023-09-13 DIAGNOSIS — I4891 Unspecified atrial fibrillation: Secondary | ICD-10-CM | POA: Diagnosis not present

## 2023-09-13 LAB — POCT INR: INR: 2.1 (ref 2.0–3.0)

## 2023-09-13 NOTE — Patient Instructions (Signed)
 On Amiodarone 200mg  daily Continue 1 tablet daily except 1/2 tablet on Mondays and Thursdays Continue greens/salad  Repeat INR in 4 week

## 2023-09-15 ENCOUNTER — Encounter (HOSPITAL_BASED_OUTPATIENT_CLINIC_OR_DEPARTMENT_OTHER): Payer: Self-pay

## 2023-09-15 LAB — BASIC METABOLIC PANEL
BUN/Creatinine Ratio: 18 (ref 10–24)
BUN: 24 mg/dL (ref 8–27)
CO2: 23 mmol/L (ref 20–29)
Calcium: 8.5 mg/dL — ABNORMAL LOW (ref 8.6–10.2)
Chloride: 104 mmol/L (ref 96–106)
Creatinine, Ser: 1.35 mg/dL — ABNORMAL HIGH (ref 0.76–1.27)
Glucose: 195 mg/dL — ABNORMAL HIGH (ref 70–99)
Potassium: 3.9 mmol/L (ref 3.5–5.2)
Sodium: 143 mmol/L (ref 134–144)
eGFR: 52 mL/min/{1.73_m2} — ABNORMAL LOW (ref >59)

## 2023-09-17 NOTE — Progress Notes (Signed)
 Letter sent.

## 2023-10-09 ENCOUNTER — Telehealth: Payer: Self-pay | Admitting: Cardiovascular Disease

## 2023-10-09 NOTE — Telephone Encounter (Signed)
 Attempted to call patient, no answer/unable to leave voicemail.

## 2023-10-09 NOTE — Telephone Encounter (Signed)
 Pt c/o BP issue: STAT if pt c/o blurred vision, one-sided weakness or slurred speech.  STAT if BP is GREATER than 180/120 TODAY.  STAT if BP is LESS than 90/60 and SYMPTOMATIC TODAY  1. What is your BP concern?   Patient stated the last 2 days his BP has been trending high  2. Have you taken any BP medication today?   Yes  3. What are your last 5 BP readings?  164/85  HR 80 3/25 174/91  HR 81  3/24 167/90  HR 74  3/23 144/71  HR 74  3/22 150/72  HR 76  3/21  4. Are you having any other symptoms (ex. Dizziness, headache, blurred vision, passed out)?   No  Patient stated she is concerned his BP has been trending high and wants advice on next steps.

## 2023-10-10 ENCOUNTER — Ambulatory Visit: Payer: Medicare HMO | Attending: Cardiovascular Disease | Admitting: *Deleted

## 2023-10-10 DIAGNOSIS — I4891 Unspecified atrial fibrillation: Secondary | ICD-10-CM | POA: Diagnosis not present

## 2023-10-10 DIAGNOSIS — Z5181 Encounter for therapeutic drug level monitoring: Secondary | ICD-10-CM

## 2023-10-10 LAB — POCT INR: INR: 3.1 — AB (ref 2.0–3.0)

## 2023-10-10 NOTE — Patient Instructions (Signed)
 On Amiodarone 200mg  daily Continue 1 tablet daily except 1/2 tablet on Mondays and Thursdays Eat extra greens/salad today Repeat INR in 4 week

## 2023-10-10 NOTE — Telephone Encounter (Signed)
 2nd attempt to call patient, no answer left message requesting a call back.

## 2023-10-11 ENCOUNTER — Encounter: Payer: Self-pay | Admitting: Cardiovascular Disease

## 2023-10-11 ENCOUNTER — Telehealth: Payer: Self-pay | Admitting: Family

## 2023-10-11 ENCOUNTER — Encounter (HOSPITAL_BASED_OUTPATIENT_CLINIC_OR_DEPARTMENT_OTHER): Payer: Self-pay | Admitting: Family

## 2023-10-11 ENCOUNTER — Ambulatory Visit (HOSPITAL_BASED_OUTPATIENT_CLINIC_OR_DEPARTMENT_OTHER): Payer: Medicare HMO | Admitting: Family

## 2023-10-11 VITALS — BP 156/77 | HR 81 | Resp 16 | Ht 74.0 in | Wt 210.0 lb

## 2023-10-11 DIAGNOSIS — D6859 Other primary thrombophilia: Secondary | ICD-10-CM | POA: Diagnosis not present

## 2023-10-11 DIAGNOSIS — I10 Essential (primary) hypertension: Secondary | ICD-10-CM | POA: Diagnosis not present

## 2023-10-11 DIAGNOSIS — I4819 Other persistent atrial fibrillation: Secondary | ICD-10-CM

## 2023-10-11 MED ORDER — AMLODIPINE BESYLATE 10 MG PO TABS: 10.0000 mg | ORAL_TABLET | Freq: Every day | ORAL | 3 refills | Status: DC

## 2023-10-11 MED ORDER — CARVEDILOL 6.25 MG PO TABS: 6.2500 mg | ORAL_TABLET | Freq: Two times a day (BID) | ORAL | 3 refills | Status: AC

## 2023-10-11 MED ORDER — OLMESARTAN MEDOXOMIL-HCTZ 40-25 MG PO TABS: 1.0000 | ORAL_TABLET | Freq: Every day | ORAL | 3 refills | Status: DC

## 2023-10-11 MED ORDER — OLMESARTAN-AMLODIPINE-HCTZ 40-10-25 MG PO TABS: 1.0000 | ORAL_TABLET | Freq: Every day | ORAL | 2 refills | Status: DC

## 2023-10-11 NOTE — Patient Instructions (Addendum)
 Medication Instructions:   STOP Metoprolol  START Carvedilol 6.25mg  one tablet twice daily This will help to keep your heart rate controlled and lower your blood pressure  STOP Amlodipine STOP Olmesartan STOP Hydrochlorothiazide  START Olmesartan-Amlodipine-Hydrochlorothiazide 40-10-25mg  one tablet daily This takes your prior blood pressure medications and puts them all into one pill to make it easier   Follow-Up: In [facility] Clinic in 6-8 weeks with Dr. Duke Salvia or Gillian Shields, NP

## 2023-10-11 NOTE — Progress Notes (Signed)
 Advanced Hypertension Clinic Assessment:    Date:  10/11/2023   ID:  Jason Alvarez, DOB 12/11/1940, MRN [id number]  PCP:  Jason Nevins, MD  Cardiologist:  None  Nephrologist: Cardiology APP: Jason Sorrow, NP   Referring MD: Jason Nevins, MD   CC: Hypertension  History of Present Illness:    Jason Alvarez is a 83 y.o. male with a hx of persistent atrial fibrillation, hypertension here to follow up in the [facility] Clinic.   Established with [facility] Clinic 08/08/2023 after referral from AFib Clinic.  Diagnosed with hypertension greater than 60 years ago around age 33 or 23.  Blood pressures at home are 130s-150s/60-80s.  No prior tobacco use.  He lost his wife to a stroke suddenly in November and had been eating some convenience foods.  Prior sleep study with no sleep apnea.  Ramipril transitioned to Olmesartan and later increased to 40mg .   Seen by AFib Clinic 08/16/23 and elected for rate control of his atrial fibrillation as previously failed flecainide due to prolonged PR and felt poorly on amiodarone due to balance side effects.  Presents today for follow-up dependently.  Pleasant gentleman who lives independently though he has who are 26 and 83 years old who live near him. Reports feeling "off balance" with sense of disequilibrium.  No near-syncope, syncope.  Also notes bilateral knees are "bone-on-bone "which somewhat limits physical activity. BP at home has been 135/73, 150/72, 144/71, 167/90, 174/91, 164/85, 150/75.  He is overall unaware of his atrial fibrillation though does note his legs tire more quickly than he used to which he attributes to age.  Previous antihypertensives: Ramipril - switch to Olmesartan   Past Medical History:  Diagnosis Date   A-fib (HCC)    paf with NSVT on MCOT, on coumadin, echo 01/31/08-nl lv, mild aortic valve sclerosis    Aneurysm (HCC)    Anxiety    Atrial fibrillation (HCC)    Carotid bruit    doppler  05/17/12-mild plaque with no diameter reduction   Chronic anticoagulation    Chronic ITP (idiopathic thrombocytopenia) (HCC) 04/10/2012   Low grade thrombocytopenia consistent with chronic ITP not in need of therapy.    DM (diabetes mellitus) (HCC)    Dysrhythmia    afib   H/O cardiovascular stress test 01/31/08   no ishcemia, ef 61%   History of kidney stones    HTN (hypertension)    Hypertension    ITP (idiopathic thrombocytopenic purpura)    Thrombocytopenia (HCC)     Past Surgical History:  Procedure Laterality Date   CIRCUMCISION     COLONOSCOPY  06/25/2012   Procedure: COLONOSCOPY;  Surgeon: Jason Heading, MD;  [facility];  Service: Gastroenterology;  Laterality: N/A;   COLONOSCOPY N/A 01/30/2017   Procedure: COLONOSCOPY;  Surgeon: Jason Macho, MD;  Location: [facility];  Service: Gastroenterology;  Laterality: N/A;   KNEE ARTHROSCOPY     right and left knee/1992 and 2000   POLYPECTOMY  01/30/2017   Procedure: POLYPECTOMY;  Surgeon: Jason Macho, MD;  Location: [facility];  Service: Gastroenterology;;  colon   VASECTOMY      Current Medications: Current Meds  Medication Sig   ALPRAZolam (XANAX) 1 MG tablet Taking 1 tablet by mouth 4 times daily   diphenoxylate-atropine (LOMOTIL) 2.5-0.025 MG tablet Take by mouth as needed for diarrhea or loose stools.   fluticasone (FLONASE) 50 MCG/ACT nasal spray Place into both nostrils as needed.   glipiZIDE-metformin (  METAGLIP) 5-500 MG per tablet Take 1 tablet by mouth daily. Patient only taking one tablet daily   metoprolol succinate (TOPROL-XL) 25 MG 24 hr tablet TAKE 1/2 TABLET BY MOUTH ONCE DAILY   OneTouch Delica Lancets 33G MISC    ONETOUCH VERIO test strip 1 each 2 (two) times daily.   Vitamin D, Ergocalciferol, (DRISDOL) 1.25 MG (50000 UNIT) CAPS capsule Take 50,000 Units by mouth every 7 (seven) days.   warfarin (COUMADIN) 5 MG tablet TAKE 1/2 TO 1 TABLET DAILY AS DIRECTED   [DISCONTINUED] amLODipine  (NORVASC) 10 MG tablet Take 1 tablet (10 mg total) by mouth daily.   [DISCONTINUED] hydrochlorothiazide (HYDRODIURIL) 25 MG tablet Take 25 mg by mouth daily.   [DISCONTINUED] olmesartan (BENICAR) 40 MG tablet Take 1 tablet (40 mg total) by mouth daily.     Allergies:   Penicillins   Social History   Socioeconomic History   Marital status: Married    Spouse name: Not on file   Number of children: Not on file   Years of education: Not on file   Highest education level: Not on file  Occupational History   Not on file  Tobacco Use   Smoking status: Never    Passive exposure: Never   Smokeless tobacco: Never   Tobacco comments:    Never smoke 10/18/22  Vaping Use   Vaping status: Never Used  Substance and Sexual Activity   Alcohol use: No   Drug use: No   Sexual activity: Not on file  Other Topics Concern   Not on file  Social History Narrative   Not on file   Social Drivers of Health   Financial Resource Strain: Not on file  Food Insecurity: Not on file  Transportation Needs: Not on file  Physical Activity: Not on file  Stress: Not on file  Social Connections: Not on file     Family History: The patient's family history includes Cancer in his brother and sister; Melanoma in his mother; Prostate cancer in his father.  ROS:   Please see the history of present illness.     All other systems reviewed and are negative.  EKGs/Labs/Other Studies Reviewed:         Recent Labs: 05/16/2023: ALT 15; Hemoglobin 11.2; Platelets 155; TSH 3.850 09/14/2023: BUN 24; Creatinine, Ser 1.35; Potassium 3.9; Sodium 143   Recent Lipid Panel    Component Value Date/Time   CHOL 163 04/08/2019 0758   TRIG 65 04/08/2019 0758   HDL 45 04/08/2019 0758   LDLCALC 105 (H) 04/08/2019 0758    Physical Exam:   VS:  BP (!) 156/77 (BP Location: Left Arm, Cuff Size: Normal)   Pulse 81   Resp 16   Ht 6\' 2"  (1.88 m)   Wt 210 lb (95.3 kg)   SpO2 96%   BMI 26.96 kg/m  , BMI Body mass  index is 26.96 kg/m. GENERAL:  Well appearing HEENT: Pupils equal round and reactive, fundi not visualized, oral mucosa unremarkable NECK:  No jugular venous distention, waveform within normal limits, carotid upstroke brisk and symmetric, no bruits, no thyromegaly LYMPHATICS:  No cervical adenopathy LUNGS:  Clear to auscultation bilaterally HEART:  IRIR.  PMI not displaced or sustained,S1 and S2 within normal limits, no S3, no S4, no clicks, no rubs, no murmurs ABD:  Flat, positive bowel sounds normal in frequency in pitch, no bruits, no rebound, no guarding, no midline pulsatile mass, no hepatomegaly, no splenomegaly EXT:  2 plus pulses throughout, no  edema, no cyanosis no clubbing SKIN:  No rashes no nodules NEURO:  Cranial nerves II through XII grossly intact, motor grossly intact throughout PSYCH:  Cognitively intact, oriented to person place and time   ASSESSMENT/PLAN:    HTN - BP not at goal <130/80.  Consolidate to Olmesartan-hydrochlorothiazide 40-25 mg daily and amlodipine 10 mg daily. Stop Toprol 12.5 mg daily and start carvedilol 6.25 mg twice daily. If BP remains uncontrolled could consider hydralazine twice daily. Discussed to monitor BP at home at least 2 hours after medications and sitting for 5-10 minutes.  Secondary hypertension workup Prior normal TSH Prior sleep study no sleep apnea 08/16/23 renin-aldosterone not consistent with hyperaldosteronism 08/2021 CT normal adrenals  Persistent atrial fibrillation / Hypercoagulabe state -  Has elected rate control as did not toelrate Flecainide (PR prolongation) and AMiodarone (side effects). CHA2DS2-VASc Score = 4 [CHF History: 0, HTN History: 1, Diabetes History: 1, Stroke History: 0, Vascular Disease History: 0, Age Score: 2, Gender Score: 0].  Therefore, the patient's annual risk of stroke is 4.8 %.    Continue Coumadin per Coumadin Clinic instructions. Denies bleeding complications.  Transition metoprolol to carvedilol, as  above.  DM2 - Continue to follow with PCP.   Screening for Secondary Hypertension:     Relevant Labs/Studies:    Latest Ref Rng & Units 09/14/2023   10:03 AM 08/16/2023    9:32 AM 05/16/2023   10:10 AM  Basic Labs  Sodium 134 - 144 mmol/L 143  140  139   Potassium 3.5 - 5.2 mmol/L 3.9  3.9  3.6   Creatinine 0.76 - 1.27 mg/dL 9.60  4.54  0.98        Latest Ref Rng & Units 05/16/2023   10:16 AM 10/18/2022    8:20 AM  Thyroid   TSH 0.350 - 4.500 uIU/mL 3.850  3.968        Latest Ref Rng & Units 08/16/2023   10:11 AM  Renin/Aldosterone   Aldosterone 0.0 - 30.0 ng/dL 4.9                 Disposition:    FU with MD/APP/PharmD in 6-8 weeks    Medication Adjustments/Labs and Tests Ordered: Current medicines are reviewed at length with the patient today.  Concerns regarding medicines are outlined above.  No orders of the defined types were placed in this encounter.  No orders of the defined types were placed in this encounter.    Signed, Jason Sorrow, NP  10/11/2023 8:31 AM    [facility]

## 2023-10-11 NOTE — Telephone Encounter (Signed)
 Patient called office, transferred from call center.   Explained Rx change, patient verbalizes understanding

## 2023-10-11 NOTE — Telephone Encounter (Signed)
 Received call from pt pharmacy. His insurance will not cover triple tablet, will only cover the olmesartan-hydrochlorothiazide and separate amlodipine.   New rx sent

## 2023-10-11 NOTE — Telephone Encounter (Signed)
 Pharmacy calling in wanting to speak with a nurse in regards to an E-Script. Please advise

## 2023-10-11 NOTE — Telephone Encounter (Signed)
 3rd attempt to call patient, no answer, left message requesting a call back Nursing will await for patient to return call

## 2023-10-11 NOTE — Telephone Encounter (Signed)
 Agree with Rx change.  Alver Sorrow, NP

## 2023-10-11 NOTE — Telephone Encounter (Signed)
 Error

## 2023-10-19 ENCOUNTER — Telehealth: Payer: Self-pay | Admitting: Family

## 2023-10-19 NOTE — Telephone Encounter (Signed)
 Pt c/o medication issue:  1. Name of Medication: carvedilol (COREG) 6.25 MG tablet   2. How are you currently taking this medication (dosage and times per day)? Take 1 tablet (6.25 mg total) by mouth 2 (two) times daily.   3. Are you having a reaction (difficulty breathing--STAT)? No  4. What is your medication issue? Pt states that he thinks that medication is causing swelling in legs. Pt would like to know if he is able to go back to taking Olmesartan. Please advise

## 2023-10-19 NOTE — Telephone Encounter (Signed)
Called patient and unable to leave VM; mailbox is full.

## 2023-10-22 NOTE — Telephone Encounter (Signed)
 Message sent to CW from pt pcp, will call 4/7 to schedule appointment for June follow up

## 2023-10-24 ENCOUNTER — Telehealth (HOSPITAL_BASED_OUTPATIENT_CLINIC_OR_DEPARTMENT_OTHER): Payer: Self-pay | Admitting: Family

## 2023-10-24 ENCOUNTER — Telehealth: Payer: Self-pay

## 2023-10-24 DIAGNOSIS — I8311 Varicose veins of right lower extremity with inflammation: Secondary | ICD-10-CM

## 2023-10-24 NOTE — Telephone Encounter (Signed)
 New Message:      Patient said he called to [facility] to make an appointment. They told him that he would need a referral from his provider(Caitlin Walker). Patient would like for [facility] top refer him there please.Marland Kitcheni

## 2023-10-24 NOTE — Telephone Encounter (Signed)
 Triage:  -pt called stating he is having a hard time with this legs.  He has has a couple episodes where the leg fell asleep, almost gave out, and numbness.  He states he guesses he has the neuropathy but he is not sure why they would give out like that.  He states he did go to another vein office and they did some procedure.  He also received notification that insurance will not pay for him to go to the other dr but would rather just come here any way. -last visit was in 2024 with Dr. Arbie Cookey. Will have scheduling set up appt.

## 2023-10-24 NOTE — Telephone Encounter (Signed)
 Pt has history of varicose veins, ok to refer to [facility]?

## 2023-10-25 NOTE — Telephone Encounter (Signed)
 Okay to refer to [facility] for varicose veins.   Alver Sorrow, NP

## 2023-10-26 ENCOUNTER — Telehealth: Payer: Self-pay | Admitting: Cardiology

## 2023-10-26 ENCOUNTER — Telehealth (HOSPITAL_BASED_OUTPATIENT_CLINIC_OR_DEPARTMENT_OTHER): Payer: Self-pay | Admitting: *Deleted

## 2023-10-26 NOTE — Telephone Encounter (Signed)
 Yes, you are correct. He does not need a new referral. I will send our trige nurse Marcelino Duster a message and we will get him scheduled for an arterial evaluation with [facility].   Above message received from [facility] and she has reached out to RN to get appointment for patient

## 2023-10-26 NOTE — Telephone Encounter (Signed)
 McGonigal, Cristal Deer, RVS, RCS  Alver Sorrow, NP; Marlene Lard, RN Hello.  I am reviewing the referral sent to [facility] specialist for  Jason Alvarez.  Just need to confirm reason for referral. Referral states varicose veins; however, we he called our office and spoke to the triage nurse yesterday, he reported numbness and his legs falling asleep. He didn't mention anything about his varicose veins.  He also has a history of PAD.  Could you verify reason for referral so I am sure to order the correct imaging for him?  Thanks  I called and spoke with him and it sounds more like he is needing to be seen for PAD. He had spoken with Marcelino Duster RN on 4/9 who had documented pt needed new referral because he had not been seen since 2021. I reviewed his chart and he saw Dr Arbie Cookey 12/2022. Have sent message to [facility] to see if can just be called to get follow up appointment

## 2023-10-26 NOTE — Telephone Encounter (Signed)
  Patient called cardiology answering service this morning. He was apparently cold called this morning by someone he thinks was with the [facility] Emergency department. It sounds like patient couldn't make it to the phone before the call went to voicemail so he was never able to talk with the caller. Patient admits that this caused significant distress and after taking his blood pressure, noted he was hypertensive at 168/131mmHg with HR 109. He took his morning BP meds and then called [facility].   I see no evidence of a call to patient from [facility]. Perhaps this was a spam call. I reassured patient that moderate hypertension in the setting of a stressful situation is expected. Reassurance provided, and patient asked to recheck BP at 9:00am. He can call back if vital signs remain elevated.   [name], PA-C

## 2023-10-31 ENCOUNTER — Telehealth: Payer: Self-pay

## 2023-10-31 NOTE — Telephone Encounter (Addendum)
 Appt:  -spoke to pt this am.  Offered 2 days with availabilities this am.  He needs his son to bring him and will try to get a day set for him to come.  Hopefully he will call back this am before slot are filled  -Pt called back said he already had other appointments on those days. He will call back to schedule

## 2023-11-05 ENCOUNTER — Other Ambulatory Visit: Payer: Medicare HMO

## 2023-11-06 LAB — PSA, TOTAL AND FREE
PSA, Free Pct: 30.8 %
PSA, Free: 3.17 ng/mL
Prostate Specific Ag, Serum: 10.3 ng/mL — ABNORMAL HIGH (ref 0.0–4.0)

## 2023-11-07 ENCOUNTER — Ambulatory Visit: Attending: Cardiology | Admitting: *Deleted

## 2023-11-07 DIAGNOSIS — Z5181 Encounter for therapeutic drug level monitoring: Secondary | ICD-10-CM | POA: Diagnosis not present

## 2023-11-07 DIAGNOSIS — I4819 Other persistent atrial fibrillation: Secondary | ICD-10-CM | POA: Diagnosis not present

## 2023-11-07 LAB — POCT INR: INR: 1.6 — AB (ref 2.0–3.0)

## 2023-11-07 NOTE — Patient Instructions (Signed)
 On Amiodarone  200mg  daily Take warfarin 2 tablets tonight then resume 1 tablet daily except 1/2 tablet on Mondays and Thursdays Be consistent with greens/salad Repeat INR in 2 week

## 2023-11-08 ENCOUNTER — Encounter: Payer: Self-pay | Admitting: Urology

## 2023-11-08 ENCOUNTER — Ambulatory Visit: Payer: Medicare HMO | Admitting: Urology

## 2023-11-08 VITALS — BP 148/65 | HR 99

## 2023-11-08 DIAGNOSIS — N138 Other obstructive and reflux uropathy: Secondary | ICD-10-CM

## 2023-11-08 DIAGNOSIS — R3912 Poor urinary stream: Secondary | ICD-10-CM | POA: Diagnosis not present

## 2023-11-08 DIAGNOSIS — N200 Calculus of kidney: Secondary | ICD-10-CM

## 2023-11-08 DIAGNOSIS — R351 Nocturia: Secondary | ICD-10-CM

## 2023-11-08 DIAGNOSIS — N401 Enlarged prostate with lower urinary tract symptoms: Secondary | ICD-10-CM

## 2023-11-08 DIAGNOSIS — Z87898 Personal history of other specified conditions: Secondary | ICD-10-CM | POA: Diagnosis not present

## 2023-11-08 DIAGNOSIS — R972 Elevated prostate specific antigen [PSA]: Secondary | ICD-10-CM

## 2023-11-08 LAB — URINALYSIS, ROUTINE W REFLEX MICROSCOPIC
Bilirubin, UA: NEGATIVE
Ketones, UA: NEGATIVE
Leukocytes,UA: NEGATIVE
Nitrite, UA: NEGATIVE
Specific Gravity, UA: 1.025 (ref 1.005–1.030)
Urobilinogen, Ur: 0.2 mg/dL (ref 0.2–1.0)
pH, UA: 6 (ref 5.0–7.5)

## 2023-11-08 LAB — MICROSCOPIC EXAMINATION: Bacteria, UA: NONE SEEN

## 2023-11-08 NOTE — Progress Notes (Unsigned)
 Subjective:  1. Elevated PSA   2. BPH with urinary obstruction   3. Nocturia   4. Weak urinary stream   5. Renal stones      11/08/23: Jason Alvarez returns today in f/u. For his history of BPH with BOO and renal stones.  He has an elevated PSA but has declined biopsies.   He has stable LUTS with an IPSS of 15 and nocturia x 3 with a weak stream.   UA has 3+ glucose, 1+ protein and blood but only 0-2 RBC's.  His PSA is up to 10.3 with a 30% f/t ratio.  His PSA was up to 15 earlier this year.  His prostate was on the MRI in 2017.    05/17/23: Jason Alvarez returns today in f/u.   He has a history of BPH with BOO and renal stones with prior hematuria.   He had an elevated PSA with the last stable at 7.5 in 2/23. We decided to forgo further testing after last year.   His IPSS is 12 with a reduced stream.  He has minimal burning with voiding.  His UA has 3-10 RBC's which is chronic.  He has had no flank pain.  His Cr was 1.41 on10/30/24.  He was taken off of amiodarone  yesterday for weakness.  He remains on warfarin.  His PVR is minimal.   03/16/22: Jason Alvarez returns today in f/u.  He had a hematuria w/u last year and was found to have tiny renal stones and prostate enlargement.  He has still not tried the finasteride  or tamsulosin .  He has had some increased frequency over the last week.  His IPSS today is 20.  He has a reduced stream.  His UA today has 3-10 RBC's.  His last PSA was 7.5 in 2/23 which was stable.  He has 1+ glucose but is off of farxiga.   03/17/21: Jason Alvarez returns today in f/u from a CT hematuria study for microhematuria noted in April.  He was found to have punctate renal stones and a large prostate but no other significant GU findings.   He was given tamsulosin  at his last visit but he has never started that.  He has previously declined finasteride .   He has had a lot on his plate dealing with his wife.  His UA still has 3-10 RBC's.  His IPSS is stable at 17 with a weak stream and a sensation of  incomplete emptying.  He has been started on Farxiga and has 2+ glucose.  His Cr was 1.1.     11/04/20: Jason Alvarez returns today in f/u for his several year history of BPH with BOO and an elevated PSA. His PSA is 7.5 which is fairly stable.  It was 6.1 in 11/26/18.  It is 8.6 with a 34% f/t ratio on 12/04/19 which is in his usual range.  He had a prostate MRI in the fall of 2017 that was negative but his prostate was very large. He was given a script for finasteride  but he never started it.  His IPSS is 17 which is stable.  He has an episode of enuresis recently but that hasn't recurred.  He has no associated signs or symptoms.  Cr is 1.27 on 09/13/20.   Jason Alvarez has had occasional recurrent balanitis but it has responded to nystatin and hasn't had to use it recently.    IPSS     Row Name 11/08/23 0900         International Prostate Symptom Score  How often have you had the sensation of not emptying your bladder? Less than 1 in 5     How often have you had to urinate less than every two hours? About half the time     How often have you found you stopped and started again several times when you urinated? Not at All     How often have you found it difficult to postpone urination? Less than 1 in 5 times     How often have you had a weak urinary stream? Almost always     How often have you had to strain to start urination? Less than half the time     How many times did you typically get up at night to urinate? 3 Times     Total IPSS Score 15       Quality of Life due to urinary symptoms   If you were to spend the rest of your life with your urinary condition just the way it is now how would you feel about that? Delighted                     ROS:  ROS:  A complete review of systems was performed.  All systems are negative except for pertinent findings as noted.   Review of Systems  HENT:  Positive for congestion.   Gastrointestinal:  Positive for diarrhea.  Neurological:   Positive for weakness.  Endo/Heme/Allergies:  Bruises/bleeds easily.  Psychiatric/Behavioral:  Positive for depression.   All other systems reviewed and are negative.   Allergies  Allergen Reactions   Penicillins Other (See Comments)    Childhood allergy Has patient had a PCN reaction causing immediate rash, facial/tongue/throat swelling, SOB or lightheadedness with hypotension: Unknown Has patient had a PCN reaction causing severe rash involving mucus membranes or skin necrosis: Unknown Has patient had a PCN reaction that required hospitalization: Unknown Has patient had a PCN reaction occurring within the last 10 years: Unknown If all of the above answers are "NO", then may proceed with Cephalosporin use.    Outpatient Encounter Medications as of 11/08/2023  Medication Sig   ALPRAZolam (XANAX) 1 MG tablet Taking 1 tablet by mouth 4 times daily   amLODipine  (NORVASC ) 10 MG tablet Take 1 tablet (10 mg total) by mouth daily.   carvedilol  (COREG ) 6.25 MG tablet Take 1 tablet (6.25 mg total) by mouth 2 (two) times daily.   diphenoxylate-atropine (LOMOTIL) 2.5-0.025 MG tablet Take by mouth as needed for diarrhea or loose stools.   escitalopram (LEXAPRO) 10 MG tablet Take 10 mg by mouth daily.   fluticasone (FLONASE) 50 MCG/ACT nasal spray Place into both nostrils as needed.   glipiZIDE-metformin (METAGLIP) 5-500 MG per tablet Take 1 tablet by mouth daily. Patient only taking one tablet daily   olmesartan -hydrochlorothiazide (BENICAR  HCT) 40-25 MG tablet Take 1 tablet by mouth daily.   OneTouch Delica Lancets 33G MISC    ONETOUCH VERIO test strip 1 each 2 (two) times daily.   Vitamin D , Ergocalciferol , (DRISDOL) 1.25 MG (50000 UNIT) CAPS capsule Take 50,000 Units by mouth every 7 (seven) days.   warfarin (COUMADIN ) 5 MG tablet TAKE 1/2 TO 1 TABLET DAILY AS DIRECTED   No facility-administered encounter medications on file as of 11/08/2023.    Past Medical History:  Diagnosis Date   A-fib  (HCC)    paf with NSVT on MCOT, on coumadin , echo 01/31/08-nl lv, mild aortic valve sclerosis    Aneurysm (HCC)  Anxiety    Atrial fibrillation (HCC)    Carotid bruit    doppler 05/17/12-mild plaque with no diameter reduction   Chronic anticoagulation    Chronic ITP (idiopathic thrombocytopenia) (HCC) 04/10/2012   Low grade thrombocytopenia consistent with chronic ITP not in need of therapy.    DM (diabetes mellitus) (HCC)    Dysrhythmia    afib   H/O cardiovascular stress test 01/31/08   no ishcemia, ef 61%   History of kidney stones    HTN (hypertension)    Hypertension    ITP (idiopathic thrombocytopenic purpura)    Thrombocytopenia (HCC)     Past Surgical History:  Procedure Laterality Date   CIRCUMCISION     COLONOSCOPY  06/25/2012   Procedure: COLONOSCOPY;  Surgeon: Beau Bound, MD;  [facility];  Service: Gastroenterology;  Laterality: N/A;   COLONOSCOPY N/A 01/30/2017   Procedure: COLONOSCOPY;  Surgeon: Alanda Allegra, MD;  [facility];  Service: Gastroenterology;  Laterality: N/A;   KNEE ARTHROSCOPY     right and left knee/1992 and 2000   POLYPECTOMY  01/30/2017   Procedure: POLYPECTOMY;  Surgeon: Alanda Allegra, MD;  [facility];  Service: Gastroenterology;;  colon   VASECTOMY      Social History   Socioeconomic History   Marital status: Married    Spouse name: Not on file   Number of children: Not on file   Years of education: Not on file   Highest education level: Not on file  Occupational History   Not on file  Tobacco Use   Smoking status: Never    Passive exposure: Never   Smokeless tobacco: Never   Tobacco comments:    Never smoke 10/18/22  Vaping Use   Vaping status: Never Used  Substance and Sexual Activity   Alcohol use: No   Drug use: No   Sexual activity: Not on file  Other Topics Concern   Not on file  Social History Narrative   Not on file   Social Drivers of Health   Financial Resource Strain:  Not on file  Food Insecurity: Not on file  Transportation Needs: Not on file  Physical Activity: Not on file  Stress: Not on file  Social Connections: Not on file  Intimate Partner Violence: Not on file    Family History  Problem Relation Age of Onset   Prostate cancer Father    Cancer Sister        breast   Cancer Brother        lung   Melanoma Mother        Objective: Vitals:   11/08/23 0857  BP: (!) 148/65  Pulse: 99      Physical Exam Vitals reviewed.  Constitutional:      Appearance: Normal appearance.  Neurological:     Mental Status: He is alert.     Lab Results:  Results for orders placed or performed in visit on 11/08/23 (from the past 24 hours)  Urinalysis, Routine w reflex microscopic     Status: Abnormal   Collection Time: 11/08/23  8:57 AM  Result Value Ref Range   Specific Gravity, UA 1.025 1.005 - 1.030   pH, UA 6.0 5.0 - 7.5   Color, UA Yellow Yellow   Appearance Ur Clear Clear   Leukocytes,UA Negative Negative   Protein,UA 2+ (A) Negative/Trace   Glucose, UA 3+ (A) Negative   Ketones, UA Negative Negative   RBC, UA 1+ (A) Negative  Bilirubin, UA Negative Negative   Urobilinogen, Ur 0.2 0.2 - 1.0 mg/dL   Nitrite, UA Negative Negative   Microscopic Examination See below:    Narrative   Performed at:  [address] - [facility] [address], [place], [place]  [id number] Lab Director: Liliana Regulus MT, Phone:  [phone]  Microscopic Examination     Status: None   Collection Time: 11/08/23  8:57 AM  Result Value Ref Range   WBC, UA 0-5 0 - 5 /hpf   RBC, Urine 0-2 0 - 2 /hpf   Epithelial Cells (non renal) 0-10 0 - 10 /hpf   Bacteria, UA None seen None seen/Few   Narrative   Performed at:  [address] - [facility] [address], [place], [place]  [id number] Lab Director: Liliana Regulus MT, Phone:  [phone]       BMET No results for input(s): "NA", "K", "CL", "CO2", "GLUCOSE", "BUN", "CREATININE", "CALCIUM" in the last  72 hours.  PSA Lab Results  Component Value Date   PSA1 10.3 (H) 11/05/2023   PSA1 9.5 (H) 09/11/2023   PSA1 7.5 (H) 09/01/2021    PSA  Date Value Ref Range Status  12/04/2019 8.6  Final  11/26/2018 6.1  Final   No results found for: "TESTOSTERONE"  UA reviewed.   Results for orders placed or performed in visit on 11/08/23 (from the past 24 hours)  Urinalysis, Routine w reflex microscopic     Status: Abnormal   Collection Time: 11/08/23  8:57 AM  Result Value Ref Range   Specific Gravity, UA 1.025 1.005 - 1.030   pH, UA 6.0 5.0 - 7.5   Color, UA Yellow Yellow   Appearance Ur Clear Clear   Leukocytes,UA Negative Negative   Protein,UA 2+ (A) Negative/Trace   Glucose, UA 3+ (A) Negative   Ketones, UA Negative Negative   RBC, UA 1+ (A) Negative   Bilirubin, UA Negative Negative   Urobilinogen, Ur 0.2 0.2 - 1.0 mg/dL   Nitrite, UA Negative Negative   Microscopic Examination See below:    Narrative   Performed at:  [address] - [facility] [address], [place], [place]  [id number] Lab Director: Liliana Regulus MT, Phone:  [phone]  Microscopic Examination     Status: None   Collection Time: 11/08/23  8:57 AM  Result Value Ref Range   WBC, UA 0-5 0 - 5 /hpf   RBC, Urine 0-2 0 - 2 /hpf   Epithelial Cells (non renal) 0-10 0 - 10 /hpf   Bacteria, UA None seen None seen/Few   Narrative   Performed at:  [address] - [facility] [address] [place], [place]  [id number] Lab Director: Liliana Regulus MT, Phone:  [phone]        Studies/Results: No results found.   Assessment & Plan: BPH with BOO.  He has  Stable LUTS with a history of an elevated PSA.Aaron Aas   He never took the finasteride  or the tamsulosin .   I have tried to get him on meds for the prostate but will not push the issue anymore.     Microhematuria with renal stones.  The CT just showd tiny renal stones.    Cystoscopy suggested a prostatic source of bleeding.  UA today is unremarkable.    Elevated PSA.  His PSA is up some prior to this visit but it has been higher.  He has a favorable f/t ratio.  He has declined biopsy in the past.  I will check the  PSA in a year and if it stable, we can forgo further testing.   No orders of the defined types were placed in this encounter.    Orders Placed This Encounter  Procedures   Microscopic Examination   Urinalysis, Routine w reflex microscopic   PSA, total and free    Standing Status:   Future    Expected Date:   10/31/2024    Expiration Date:   11/07/2024      Return in about 1 year (around 11/07/2024) for He would like to see me in [place].   Please give him the contact information.  PSA here. .   CC: Kathyleen Parkins, MD      Homero Luster 11/09/2023 Patient ID: Lequita Ranger, male   DOB: Dec 10, 1940, 83 y.o.   MRN: [id number]

## 2023-11-21 ENCOUNTER — Ambulatory Visit: Attending: Cardiovascular Disease | Admitting: *Deleted

## 2023-11-21 DIAGNOSIS — Z5181 Encounter for therapeutic drug level monitoring: Secondary | ICD-10-CM

## 2023-11-21 DIAGNOSIS — I4819 Other persistent atrial fibrillation: Secondary | ICD-10-CM | POA: Diagnosis not present

## 2023-11-21 LAB — POCT INR: INR: 2.8 (ref 2.0–3.0)

## 2023-11-21 NOTE — Patient Instructions (Signed)
 On Amiodarone  200mg  daily Continue warfarin 1 tablet daily except 1/2 tablet on Mondays and Thursdays Be consistent with greens/salad Repeat INR in 4 weeks

## 2023-11-22 ENCOUNTER — Ambulatory Visit (HOSPITAL_BASED_OUTPATIENT_CLINIC_OR_DEPARTMENT_OTHER): Admitting: Family

## 2023-11-22 ENCOUNTER — Encounter (HOSPITAL_BASED_OUTPATIENT_CLINIC_OR_DEPARTMENT_OTHER): Payer: Self-pay | Admitting: Family

## 2023-11-22 VITALS — BP 142/64 | HR 65 | Ht 74.0 in | Wt 212.0 lb

## 2023-11-22 DIAGNOSIS — I10 Essential (primary) hypertension: Secondary | ICD-10-CM | POA: Diagnosis not present

## 2023-11-22 DIAGNOSIS — D6859 Other primary thrombophilia: Secondary | ICD-10-CM

## 2023-11-22 DIAGNOSIS — I4819 Other persistent atrial fibrillation: Secondary | ICD-10-CM

## 2023-11-22 NOTE — Patient Instructions (Addendum)
 Medication Instructions:  Your physician recommends that you continue on your current medications as directed. Please refer to the Current Medication list given to you today.  Carvedilol  twice daily   Follow-Up: Please follow up in  4 months in [facility] with Dr. Theodis Fiscal, Neomi Banks, NP or Donivan Furry PharmD    Special Instructions:   We will request the imaging of your leg from [facility].   Your average blood pressure on your home readings was 134/69 which is fantastic!  To prevent or reduce lower extremity swelling: Eat a low salt diet. Salt makes the body hold onto extra fluid which causes swelling. Sit with legs elevated. For example, in the recliner or on an ottoman.  Wear knee-high compression stockings during the daytime. Ones labeled 15-20 mmHg provide good compression.

## 2023-11-22 NOTE — Progress Notes (Signed)
 [facility] Assessment:    Date:  11/23/2023   ID:  Jason Alvarez, DOB [birth year], MRN [id number]  PCP:  Kathyleen Parkins, MD  Cardiologist:  None  Nephrologist: Cardiology APP: Clearnce Curia, NP   Referring MD: Kathyleen Parkins, MD   CC: Hypertension  History of Present Illness:    Jason Alvarez is a 83 y.o. male with a hx of persistent atrial fibrillation, hypertension here to follow up in the [facility].   Established with [facility] 08/08/2023 after referral from [facility] Clinic.  Diagnosed with hypertension greater than 60 years ago around age 74 or 89.  Blood pressures at home are 130s-150s/60-80s.  No prior tobacco use.  He lost his wife to a stroke suddenly in November and had been eating some convenience foods.  Prior sleep study with no sleep apnea.  Ramipril  transitioned to Olmesartan  and later increased to 40mg .   Seen by [facility]ib Clinic 08/16/23 and elected for rate control of his atrial fibrillation as previously failed flecainide  due to prolonged PR and felt poorly on amiodarone  due to balance side effects.  Last seen 10/11/2023.  Due to elevated BP Toprol  stopped and transition to carvedilol  6.25 mg twice per day.  Medications consolidated olmesartan -HCTZ 40-25 mg daily and amlodipine  10 mg daily.  Unfortunately insurance would not cover triple combination tablet.  History of Present Illness Jason Alvarez is an 83 year old male with hypertension and atrial fibrillation who presents with neuropathy symptoms and balance issues.  Pleasant gentleman who lives independently since his wife's passing though has 2 sons who live near him.  He experiences numbness and tingling in his left hand, most pronounced in the morning, with symptoms initially in the right hand that have resolved. Similar sensations occur in his feet, particularly the right, over the past week or two. Encouraged to discuss with PCP.  He experiences fatigue and  weakness, with legs that 'give out'.  Previously was seen by [facility] for venous insufficiency.  He does note he more recently saw [facility] within the last year and had imaging which is unavailable for my review. No clear symptoms of claudication as does not express pain in his legs. Will request records from [facility] - if not recently performed consider ABI.  He reports balance issues and is scheduled for an MRA of his brain and neck through Dr. Donalee Fruits. He takes carvedilol  once daily as reports he was unaware of BID dosing, agreeable to take BID.    BP readings from home: 120/63, 130/70, 135/75, 140/63, 125/69, 143/71, 125/68, 127/64, 160/77. Home BP cuff previously checked by fire department and found to be accurate. Average BP at home 134/69 and HR ranging 70-80s.   Previous antihypertensives: Ramipril  - switch to Olmesartan  Lasix  Metoprolol  - switch to Carvedilol   Past Medical History:  Diagnosis Date   A-fib (HCC)    paf with NSVT on MCOT, on coumadin , echo 01/31/08-nl lv, mild aortic valve sclerosis    Aneurysm (HCC)    Anxiety    Atrial fibrillation (HCC)    Carotid bruit    doppler 05/17/12-mild plaque with no diameter reduction   Chronic anticoagulation    Chronic ITP (idiopathic thrombocytopenia) (HCC) 04/10/2012   Low grade thrombocytopenia consistent with chronic ITP not in need of therapy.    DM (diabetes mellitus) (HCC)    Dysrhythmia    afib   H/O cardiovascular stress test 01/31/08   no ishcemia, ef 61%   History  of kidney stones    HTN (hypertension)    Hypertension    ITP (idiopathic thrombocytopenic purpura)    Thrombocytopenia (HCC)     Past Surgical History:  Procedure Laterality Date   CIRCUMCISION     COLONOSCOPY  06/25/2012   Procedure: COLONOSCOPY;  Surgeon: Beau Bound, MD;  [facility];  Service: Gastroenterology;  Laterality: N/A;   COLONOSCOPY N/A 01/30/2017   Procedure: COLONOSCOPY;  Surgeon: Alanda Allegra, MD;  [facility];  Service: Gastroenterology;  Laterality: N/A;   KNEE ARTHROSCOPY     right and left knee/1992 and 2000   POLYPECTOMY  01/30/2017   Procedure: POLYPECTOMY;  Surgeon: Alanda Allegra, MD;  [facility];  Service: Gastroenterology;;  colon   VASECTOMY      Current Medications: Current Meds  Medication Sig   ALPRAZolam (XANAX) 1 MG tablet Taking 1 tablet by mouth 4 times daily   carvedilol  (COREG ) 6.25 MG tablet Take 1 tablet (6.25 mg total) by mouth 2 (two) times daily.   diphenoxylate-atropine (LOMOTIL) 2.5-0.025 MG tablet Take by mouth as needed for diarrhea or loose stools.   escitalopram (LEXAPRO) 10 MG tablet Take 10 mg by mouth daily.   fluticasone (FLONASE) 50 MCG/ACT nasal spray Place into both nostrils as needed.   glipiZIDE-metformin (METAGLIP) 5-500 MG per tablet Take 1 tablet by mouth daily. Patient only taking one tablet daily   olmesartan -hydrochlorothiazide (BENICAR  HCT) 40-25 MG tablet Take 1 tablet by mouth daily.   OneTouch Delica Lancets 33G MISC    ONETOUCH VERIO test strip 1 each 2 (two) times daily.   Vitamin D , Ergocalciferol , (DRISDOL) 1.25 MG (50000 UNIT) CAPS capsule Take 50,000 Units by mouth every 7 (seven) days.   warfarin (COUMADIN ) 5 MG tablet TAKE 1/2 TO 1 TABLET DAILY AS DIRECTED   [DISCONTINUED] amLODipine  (NORVASC ) 10 MG tablet Take 1 tablet (10 mg total) by mouth daily.     Allergies:   Penicillins   Social History   Socioeconomic History   Marital status: Married    Spouse name: Not on file   Number of children: Not on file   Years of education: Not on file   Highest education level: Not on file  Occupational History   Not on file  Tobacco Use   Smoking status: Never    Passive exposure: Never   Smokeless tobacco: Never   Tobacco comments:    Never smoke 10/18/22  Vaping Use   Vaping status: Never Used  Substance and Sexual Activity   Alcohol use: No   Drug use: No   Sexual activity: Not on file  Other Topics Concern    Not on file  Social History Narrative   Not on file   Social Drivers of Health   Financial Resource Strain: Not on file  Food Insecurity: Not on file  Transportation Needs: Not on file  Physical Activity: Not on file  Stress: Not on file  Social Connections: Not on file     Family History: The patient's family history includes Cancer in his brother and sister; Melanoma in his mother; Prostate cancer in his father.  ROS:   Please see the history of present illness.     All other systems reviewed and are negative.  EKGs/Labs/Other Studies Reviewed:          CHA2DS2-VASc Score = 4   This indicates a 4.8% annual risk of stroke. The patient's score is based upon: CHF History: 0 HTN History: 1  Diabetes History: 1 Stroke History: 0 Vascular Disease History: 0 Age Score: 2 Gender Score: 0      Recent Labs: 05/16/2023: ALT 15; Hemoglobin 11.2; Platelets 155; TSH 3.850 09/14/2023: BUN 24; Creatinine, Ser 1.35; Potassium 3.9; Sodium 143   Recent Lipid Panel    Component Value Date/Time   CHOL 163 04/08/2019 0758   TRIG 65 04/08/2019 0758   HDL 45 04/08/2019 0758   LDLCALC 105 (H) 04/08/2019 0758    Physical Exam:   VS:  BP (!) 142/64   Pulse 65   Ht 6\' 2"  (1.88 m)   Wt 212 lb (96.2 kg)   SpO2 99%   BMI 27.22 kg/m  , BMI Body mass index is 27.22 kg/m.  Vitals:   11/22/23 0947 11/22/23 1019  BP: (!) 180/78 (!) 142/64  Pulse: 65   Height: 6\' 2"  (1.88 m)   Weight: 212 lb (96.2 kg)   SpO2: 99%   BMI (Calculated): 27.21     GENERAL:  Well appearing HEENT: Pupils equal round and reactive, fundi not visualized, oral mucosa unremarkable NECK:  No jugular venous distention, waveform within normal limits, carotid upstroke brisk and symmetric, no bruits, no thyromegaly LYMPHATICS:  No cervical adenopathy LUNGS:  Clear to auscultation bilaterally HEART:  IRIR.  PMI not displaced or sustained,S1 and S2 within normal limits, no S3, no S4, no clicks, no rubs, no  murmurs ABD:  Flat, positive bowel sounds normal in frequency in pitch, no bruits, no rebound, no guarding, no midline pulsatile mass, no hepatomegaly, no splenomegaly EXT:  2 plus pulses throughout, no edema, no cyanosis no clubbing SKIN:  No rashes no nodules NEURO:  Cranial nerves II through XII grossly intact, motor grossly intact throughout PSYCH:  Cognitively intact, oriented to person place and time   ASSESSMENT/PLAN:    HTN - BP reasonably close to goal with average home BP 134/69.  Known element of whitecoat hypertension with BP initially in clinic 188/78 which improved 142/64 without intervention.  Hesitant to further escalate antihypertensive regimen due to his disequilibrium which is presently undergoing workup with Dr. Donalee Fruits. Continue Olmesartan -hydrochlorothiazide 40-25 mg daily, Amlodipine  10 mg daily, carvedilol  6.25 mg twice daily.  He has not advertently only been taking daily and is agreeable to increase to twice daily. If BP remains uncontrolled could consider further increasing carvedilol  in the future. Discussed to monitor BP at home at least 2 hours after medications and sitting for 5-10 minutes.  Secondary hypertension workup Prior normal TSH Prior sleep study no sleep apnea 08/16/23 renin-aldosterone not consistent with hyperaldosteronism 08/2021 CT normal adrenals  Persistent atrial fibrillation / Hypercoagulabe state -  Has elected rate control as did not tolerate Flecainide  (PR prolongation) and Amiodarone  (side effects). CHA2DS2-VASc Score = 4 [CHF History: 0, HTN History: 1, Diabetes History: 1, Stroke History: 0, Vascular Disease History: 0, Age Score: 2, Gender Score: 0].  Therefore, the patient's annual risk of stroke is 4.8 %.    Continue Coumadin  per Coumadin  Clinic instructions. Denies bleeding complications.   DM2 - Continue to follow with PCP.   Screening for Secondary Hypertension:     Relevant Labs/Studies:    Latest Ref Rng & Units 09/14/2023   10:03  AM 08/16/2023    9:32 AM 05/16/2023   10:10 AM  Basic Labs  Sodium 134 - 144 mmol/L 143  140  139   Potassium 3.5 - 5.2 mmol/L 3.9  3.9  3.6   Creatinine 0.76 - 1.27 mg/dL 5.28  4.13  1.41        Latest Ref Rng & Units 05/16/2023   10:16 AM 10/18/2022    8:20 AM  Thyroid    TSH 0.350 - 4.500 uIU/mL 3.850  3.968        Latest Ref Rng & Units 08/16/2023   10:11 AM  Renin/Aldosterone   Aldosterone 0.0 - 30.0 ng/dL 4.9                 Disposition:    FU with MD/APP/PharmD in 4 months    Medication Adjustments/Labs and Tests Ordered: Current medicines are reviewed at length with the patient today.  Concerns regarding medicines are outlined above.  No orders of the defined types were placed in this encounter.  No orders of the defined types were placed in this encounter.    Signed, Clearnce Curia, NP  11/23/2023 9:35 AM    [facility]

## 2023-11-23 ENCOUNTER — Telehealth: Payer: Self-pay | Admitting: Cardiovascular Disease

## 2023-11-23 ENCOUNTER — Encounter (HOSPITAL_BASED_OUTPATIENT_CLINIC_OR_DEPARTMENT_OTHER): Payer: Self-pay | Admitting: Family

## 2023-11-23 MED ORDER — AMLODIPINE BESYLATE 10 MG PO TABS: 10.0000 mg | ORAL_TABLET | Freq: Every day | ORAL | 3 refills | Status: AC

## 2023-11-23 NOTE — Telephone Encounter (Signed)
 *  STAT* If patient is at the pharmacy, call can be transferred to refill team.   1. Which medications need to be refilled? (please list name of each medication and dose if known)   amLODipine  (NORVASC ) 10 MG tablet     2. Would you like to learn more about the convenience, safety, & potential cost savings by using the [facility]? No      3. Are you open to using the [facility] (Type [facility]. ).no    4. Which pharmacy/location (including street and city if local pharmacy) is medication to be sent to? [facility] [place], [place] - [address]     5. Do they need a 30 day or 90 day supply? 90 days   Pt is out of medication he needs refill today

## 2023-11-23 NOTE — Telephone Encounter (Signed)
 Pt's medication was sent to pt's pharmacy as requested. Confirmation received.

## 2023-12-04 ENCOUNTER — Telehealth: Payer: Self-pay | Admitting: Family

## 2023-12-04 NOTE — Telephone Encounter (Signed)
 Pt c/o BP issue: STAT if pt c/o blurred vision, one-sided weakness or slurred speech.  STAT if BP is GREATER than 180/120 TODAY.  STAT if BP is LESS than 90/60 and SYMPTOMATIC TODAY  1. What is your BP concern? To low  2. Have you taken any BP medication today? yes  3. What are your last 5 BP readings?119/58 77, 100/56 84  4. Are you having any other symptoms (ex. Dizziness, headache, blurred vision, passed out)? no

## 2023-12-04 NOTE — Telephone Encounter (Signed)
 Average BP based on these readings is 138/69 which is good. Recommend continuing current antihypertensive regimen. Would like BP <130/80 and he does have quite a few readings at this goal! Recommend checking BP once per day. If for a week it is more than 130 or less than 115, let us  know. Most folks feel well with BP more than 110.If he gets a low reading (like 100/56) and feels well, not of concern. If he gets a low reading and feels lightheaded/dizzy recommend he eat a snack, drink some water , and recheck blood pressure about 30 minutes later.   Kattaleya Alia S Marcin Holte, NP

## 2023-12-04 NOTE — Telephone Encounter (Signed)
 Returned call to pt,   Patient denies new fatigue, lightheaded, dizziness or headaches.    BP Log  5/8 147/79 80 (1230) 137/63 83 (330)  5/9 164/75 78 (0700) 135/66 75 (1330)  5/10 137/65 80 (1600)  5/11 missed  5/12 147/79 77 (0730am) 150/75 82 (3pm) 5/13  missed 5/14 130/68 79 (1400)  5/15 missed  5/16 124/60 79 (1400) 169/83 79 (5pm)  5/17 129/63 78 (1330)  5/18 147/77 77 (3pm)  5/19 137/66 81 (5Pm)  5/20  119/58 77 (1100) 100/56 84 (330)    Pt will be gone tomorrow from 12-5/6 for testing.

## 2023-12-05 NOTE — Telephone Encounter (Signed)
 Returned call to pt, recommendations reviewed. Patient verbalizes understanding.

## 2023-12-12 ENCOUNTER — Ambulatory Visit (INDEPENDENT_AMBULATORY_CARE_PROVIDER_SITE_OTHER): Admitting: Internal Medicine

## 2023-12-12 ENCOUNTER — Encounter: Payer: Self-pay | Admitting: Internal Medicine

## 2023-12-12 VITALS — BP 158/88 | HR 97 | Temp 97.1°F | Ht 74.0 in | Wt 211.2 lb

## 2023-12-12 DIAGNOSIS — R142 Eructation: Secondary | ICD-10-CM | POA: Diagnosis not present

## 2023-12-12 DIAGNOSIS — R140 Abdominal distension (gaseous): Secondary | ICD-10-CM

## 2023-12-12 DIAGNOSIS — R151 Fecal smearing: Secondary | ICD-10-CM

## 2023-12-12 DIAGNOSIS — K625 Hemorrhage of anus and rectum: Secondary | ICD-10-CM

## 2023-12-12 DIAGNOSIS — R159 Full incontinence of feces: Secondary | ICD-10-CM

## 2023-12-12 DIAGNOSIS — R197 Diarrhea, unspecified: Secondary | ICD-10-CM

## 2023-12-12 MED ORDER — FAMOTIDINE 40 MG PO TABS: 40.0000 mg | ORAL_TABLET | Freq: Every day | ORAL | 11 refills | Status: AC

## 2023-12-12 NOTE — Progress Notes (Signed)
 Primary Care Physician:  Kathyleen Parkins, MD Primary Gastroenterologist:  Dr. Mordechai April  Chief Complaint  Patient presents with   Follow-up    Patient here today for a follow up on Fecal smearing. He says this is doing better. Patient says he is having more gas and belching as of late. Patient denies any burning sensations in throat, and he is not on any ppi.    HPI:   Jason Alvarez is a 83 y.o. male who presents to the clinic today for follow up visit.  Has not been seen in our office since 10/2022.    Fecal smearing, diarrhea: Initially seen April 2022 for fecal smearing/incontinence.  Was previously scheduled for flexible sigmoidoscopy but cancelled as his symptoms got better.   Notes occasional fecal leakage.  Also intermittent diarrhea but states his bowels have better as a whole.  Takes as needed Lomotil on bad days.  Previously evaluated by surgery who recommended conservative measures.  Denies any melena, does note scant hematochezia on toilet paper at times.  Last colonoscopy 2018 with 1 small tubular adenoma removed.  Did not mention internal hemorrhoids on this report.   Previously recommended to take Metamucil over-the-counter.  Took this for a while but states it increased his gas so he stopped.   Was given Kegal exercises to perform but states he did not do this  Belching, abdominal bloating: Reports frequent belching, worse after meals. Denies any heartburn or reflux. Was also told to take Simethicone  but states he did not try this.   Past Medical History:  Diagnosis Date   A-fib (HCC)    paf with NSVT on MCOT, on coumadin , echo 01/31/08-nl lv, mild aortic valve sclerosis    Aneurysm (HCC)    Anxiety    Atrial fibrillation (HCC)    Carotid bruit    doppler 05/17/12-mild plaque with no diameter reduction   Chronic anticoagulation    Chronic ITP (idiopathic thrombocytopenia) (HCC) 04/10/2012   Low grade thrombocytopenia consistent with chronic ITP not in need of  therapy.    DM (diabetes mellitus) (HCC)    Dysrhythmia    afib   H/O cardiovascular stress test 01/31/08   no ishcemia, ef 61%   History of kidney stones    HTN (hypertension)    Hypertension    ITP (idiopathic thrombocytopenic purpura)    Thrombocytopenia (HCC)     Past Surgical History:  Procedure Laterality Date   CIRCUMCISION     COLONOSCOPY  06/25/2012   Procedure: COLONOSCOPY;  Surgeon: Beau Bound, MD;  Location: [facility];  Service: Gastroenterology;  Laterality: N/A;   COLONOSCOPY N/A 01/30/2017   Procedure: COLONOSCOPY;  Surgeon: Alanda Allegra, MD;  Location: [facility];  Service: Gastroenterology;  Laterality: N/A;   KNEE ARTHROSCOPY     right and left knee/1992 and 2000   POLYPECTOMY  01/30/2017   Procedure: POLYPECTOMY;  Surgeon: Alanda Allegra, MD;  Location: [facility];  Service: Gastroenterology;;  colon   VASECTOMY      Current Outpatient Medications  Medication Sig Dispense Refill   ALPRAZolam (XANAX) 1 MG tablet Taking 1 tablet by mouth 4 times daily     amLODipine  (NORVASC ) 10 MG tablet Take 1 tablet (10 mg total) by mouth daily. 90 tablet 3   carvedilol  (COREG ) 6.25 MG tablet Take 1 tablet (6.25 mg total) by mouth 2 (two) times daily. 180 tablet 3   diphenoxylate-atropine (LOMOTIL) 2.5-0.025 MG tablet Take by mouth as needed for diarrhea or  loose stools.     escitalopram (LEXAPRO) 10 MG tablet Take 10 mg by mouth daily.     fluticasone (FLONASE) 50 MCG/ACT nasal spray Place into both nostrils as needed.     glipiZIDE-metformin (METAGLIP) 5-500 MG per tablet Take 1 tablet by mouth daily. Patient only taking one tablet daily     olmesartan -hydrochlorothiazide (BENICAR  HCT) 40-25 MG tablet Take 1 tablet by mouth daily. 90 tablet 3   OneTouch Delica Lancets 33G MISC      ONETOUCH VERIO test strip 1 each 2 (two) times daily.     Vitamin D , Ergocalciferol , (DRISDOL) 1.25 MG (50000 UNIT) CAPS capsule Take 50,000 Units by mouth every 7 (seven) days.      warfarin (COUMADIN ) 5 MG tablet TAKE 1/2 TO 1 TABLET DAILY AS DIRECTED 90 tablet 1   No current facility-administered medications for this visit.    Allergies as of 12/12/2023 - Review Complete 12/12/2023  Allergen Reaction Noted   Penicillins Other (See Comments)     Family History  Problem Relation Age of Onset   Prostate cancer Father    Cancer Sister        breast   Cancer Brother        lung   Melanoma Mother     Social History   Socioeconomic History   Marital status: Married    Spouse name: Not on file   Number of children: Not on file   Years of education: Not on file   Highest education level: Not on file  Occupational History   Not on file  Tobacco Use   Smoking status: Never    Passive exposure: Never   Smokeless tobacco: Never   Tobacco comments:    Never smoke 10/18/22  Vaping Use   Vaping status: Never Used  Substance and Sexual Activity   Alcohol use: No   Drug use: No   Sexual activity: Not on file  Other Topics Concern   Not on file  Social History Narrative   Not on file   Social Drivers of Health   Financial Resource Strain: Not on file  Food Insecurity: Not on file  Transportation Needs: Not on file  Physical Activity: Not on file  Stress: Not on file  Social Connections: Not on file  Intimate Partner Violence: Not on file    Subjective: Review of Systems  Constitutional:  Negative for chills and fever.  HENT:  Negative for congestion and hearing loss.   Eyes:  Negative for blurred vision and double vision.  Respiratory:  Negative for cough and shortness of breath.   Cardiovascular:  Negative for chest pain and palpitations.  Gastrointestinal:  Positive for diarrhea. Negative for abdominal pain, blood in stool, constipation, heartburn, melena and vomiting.       Stool incontinence  Genitourinary:  Negative for dysuria and urgency.  Musculoskeletal:  Negative for joint pain and myalgias.  Skin:  Negative for itching and rash.   Neurological:  Negative for dizziness and headaches.  Psychiatric/Behavioral:  Negative for depression. The patient is not nervous/anxious.        Objective: BP (!) 158/88 (BP Location: Left Arm, Patient Position: Sitting, Cuff Size: Normal)   Pulse 97   Temp (!) 97.1 F (36.2 C) (Temporal)   Ht 6\' 2"  (1.88 m)   Wt 211 lb 3.2 oz (95.8 kg)   BMI 27.12 kg/m  Physical Exam Constitutional:      Appearance: Normal appearance.  HENT:     Head: Normocephalic  and atraumatic.  Eyes:     Extraocular Movements: Extraocular movements intact.     Conjunctiva/sclera: Conjunctivae normal.  Cardiovascular:     Rate and Rhythm: Normal rate and regular rhythm.  Pulmonary:     Effort: Pulmonary effort is normal.     Breath sounds: Normal breath sounds.  Abdominal:     General: Bowel sounds are normal.     Palpations: Abdomen is soft.  Musculoskeletal:        General: Normal range of motion.     Cervical back: Normal range of motion and neck supple.  Skin:    General: Skin is warm.  Neurological:     General: No focal deficit present.     Mental Status: He is alert and oriented to person, place, and time.  Psychiatric:        Mood and Affect: Mood normal.        Behavior: Behavior normal.      Assessment: *Stool incontinence/leakage *Diarrhea-intermittent *Rectal bleeding *Abdominal bloating/gas *Frequent belching  Plan: Etiology of patient's symptoms likely due to poor sphincter tone.  Internal hemorrhoids might also be playing a role as well.  Metamucil made his gas worse, will stop this.    I have again printed off Kegal exercises for her to perform at home to strengthen his pelvic muscles.  Recommended low FODMAP diet.  Discussed that internal hemorrhoids could be contributing to his leakage especially if he is having bright red blood per rectum with bowel movements.  Discussed potential hemorrhoid banding with him.  This will require flexible sigmoidoscopy to further  evaluate prior.  Would like to hold off for now.  Continue Lomotil as needed for diarrhea.   Patient to take over-the-counter simethicone  prior to meals to see if this helps with his abdominal bloating/gas.  Will start on famotidine 40 mg daily.  Follow-up in 6 months or sooner if needed.  12/12/2023 10:45 AM   Disclaimer: This note was dictated with voice recognition software. Similar sounding words can inadvertently be transcribed and may not be corrected upon review.

## 2023-12-12 NOTE — Patient Instructions (Signed)
 Continue on as needed Lomotil for your diarrhea.  I am going to start you on a new medication called famotidine 40 mg daily for your belching.  I have sent this to your pharmacy.  Follow-up in 6 months or sooner if needed.  It was very nice seeing you again today.  Dr. Mordechai April

## 2023-12-19 ENCOUNTER — Ambulatory Visit: Attending: Cardiovascular Disease | Admitting: *Deleted

## 2023-12-19 DIAGNOSIS — I4819 Other persistent atrial fibrillation: Secondary | ICD-10-CM | POA: Diagnosis not present

## 2023-12-19 DIAGNOSIS — Z5181 Encounter for therapeutic drug level monitoring: Secondary | ICD-10-CM | POA: Diagnosis not present

## 2023-12-19 LAB — POCT INR: INR: 2 (ref 2.0–3.0)

## 2023-12-19 NOTE — Patient Instructions (Signed)
 On Amiodarone  200mg  daily Continue warfarin 1 tablet daily except 1/2 tablet on Mondays and Thursdays Be consistent with greens/salad Repeat INR in 4 weeks

## 2024-01-05 ENCOUNTER — Telehealth: Payer: Self-pay | Admitting: Cardiology

## 2024-01-05 NOTE — Telephone Encounter (Signed)
 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  He called reporting low blood pressures however he is reporting very well-controlled blood pressures that are around 110 systolic.  Discussed with patient these are chronic complaints that he has had for years, suspect this is more likely related to his neurological issues rather than blood pressure.  Advised him to follow-up with his neurosurgeon.  Continue to monitor blood pressure.

## 2024-01-16 ENCOUNTER — Ambulatory Visit: Attending: Cardiovascular Disease | Admitting: *Deleted

## 2024-01-16 DIAGNOSIS — I4819 Other persistent atrial fibrillation: Secondary | ICD-10-CM

## 2024-01-16 DIAGNOSIS — Z5181 Encounter for therapeutic drug level monitoring: Secondary | ICD-10-CM | POA: Diagnosis not present

## 2024-01-16 LAB — POCT INR: INR: 1.8 — AB (ref 2.0–3.0)

## 2024-01-16 NOTE — Patient Instructions (Signed)
 On Amiodarone  200mg  daily Take warfarin 1 1/2 tablets tonight then increase dose to 1 tablet daily except 1/2 tablet on Mondays  Be consistent with greens/salad Repeat INR in 3 weeks

## 2024-01-16 NOTE — Progress Notes (Signed)
Please see anticoagulation encounter.

## 2024-01-31 ENCOUNTER — Other Ambulatory Visit: Payer: Self-pay | Admitting: Cardiovascular Disease

## 2024-01-31 NOTE — Telephone Encounter (Signed)
 Prescription refill request received for warfarin Lov:  01/16/2024 Next INR check: 7/23 Warfarin tablet strength: 5mg    Refill sent.

## 2024-02-06 ENCOUNTER — Ambulatory Visit: Attending: Cardiovascular Disease | Admitting: *Deleted

## 2024-02-06 DIAGNOSIS — I4819 Other persistent atrial fibrillation: Secondary | ICD-10-CM | POA: Diagnosis not present

## 2024-02-06 DIAGNOSIS — Z5181 Encounter for therapeutic drug level monitoring: Secondary | ICD-10-CM | POA: Diagnosis not present

## 2024-02-06 LAB — POCT INR: INR: 2.6 (ref 2.0–3.0)

## 2024-02-06 NOTE — Patient Instructions (Signed)
 On Amiodarone  200mg  daily Continue warfarin 1 tablet daily except 1/2 tablet on Mondays  Be consistent with greens/salad Repeat INR in 4 weeks

## 2024-02-06 NOTE — Progress Notes (Signed)
 Please see anticoagulation encounter 2.6

## 2024-02-07 ENCOUNTER — Telehealth: Payer: Self-pay | Admitting: Family

## 2024-02-07 NOTE — Telephone Encounter (Signed)
 Called pt to f/u BP readings.  Pt reports only takes carvedilol  6.25 mg PO Q am d/t fatigue.  Pt reports feels more tired since starting medication. Pt wants to know if there is any other treatment for AF.  Pt mentioned Tikosyn was an option but wants to know what he should take or do to manage AF since has fatigue with Carvedilol .  Pt reports gets lightheaded when standing up; last about 30 sec then goes away.   Reports does not drink water  has about 2-3 glasses of Diet Mountain or  Coke.  Advised pt to increase fluid intake to see if light headedness improves.   Pt reports is now seeing [facility] Specialist on [address]  Is getting shots to legs.  Has had 4 shots and has to left.    Pt reported the following BP readings for the last 2 weeks:  7/11- 118/70-76 7/12- 124/64-87 2:30pm 139/77 7/15- 127/81-82 7/16-134/64-100 8:30 pm, 10 am 144/76-88 7/17- 144/70-74 7/18- 113/62-76 7/19- 130/68-80 7/20- 131/71-77 7/21- 144/68-93 7/22- 133/70-92 7/23-144/78-76 7/24-149/68-75 4:25pm 124/70-98  Sending to provider to f/u.

## 2024-02-07 NOTE — Telephone Encounter (Signed)
 Carvedilol  only lasts 12 hours and does not work effectively as a daily medication. Would recommend adjust to Carvedilol  3.125mg  BID. Would need to schedule follow up with [facility]ib Clinic to discuss Tikosyn, will CC Ricky, PA to help with scheduling.   Agree that lightheadedness is likely related to orthostatic hypotension and dehydration. His blood pressure readings are not too low which is good.   Daril - do you mind helping him get AFib clinic follow up? TY!  Kalissa Grays S Estera Ozier, NP

## 2024-02-07 NOTE — Telephone Encounter (Signed)
 Pt calling to report BP readings for today: 124/70 124/81 115/55 Requesting cb to discuss

## 2024-03-05 ENCOUNTER — Encounter

## 2024-03-05 ENCOUNTER — Ambulatory Visit (HOSPITAL_COMMUNITY)
Admission: RE | Admit: 2024-03-05 | Discharge: 2024-03-05 | Disposition: A | Discharge status: Home / Self Care | Source: Ambulatory Visit | Attending: Physician Assistant | Admitting: Physician Assistant

## 2024-03-05 VITALS — BP 140/60 | HR 92 | Ht 74.0 in | Wt 208.2 lb

## 2024-03-05 DIAGNOSIS — I4891 Unspecified atrial fibrillation: Secondary | ICD-10-CM

## 2024-03-05 DIAGNOSIS — I480 Paroxysmal atrial fibrillation: Secondary | ICD-10-CM

## 2024-03-05 DIAGNOSIS — D6869 Other thrombophilia: Secondary | ICD-10-CM

## 2024-03-05 DIAGNOSIS — I4819 Other persistent atrial fibrillation: Secondary | ICD-10-CM

## 2024-03-05 NOTE — Patient Instructions (Signed)
 Take 1 tablet of carvedilol  (coreg ) in the morning and 1 tablet in the evening.

## 2024-03-05 NOTE — Progress Notes (Signed)
 Primary Care Physician: Bertell Satterfield, MD Referring Physician: prior pt of Dr. Kelsie Nancyann Jason Alvarez is a 83 y.o. male with a h/o atrial fibrillation, DM, HTN, ITP, thrombocytopenia who presents for follow up in the [facility]. Patient is on warfarin for stroke prevention. He was initially on flecainide  but had significant PR prolongation and was transitioned to amiodarone . Patient wore a Zio monitor 02/2023 which showed 25% afib burden, rate controlled. Amiodarone  was discontinued 05/16/23, patient reported feeling unbalanced since starting the medication. His wife of 56 years passed away 06-15-2022.  Patient returns for follow up for atrial fibrillation. He remains in rate controlled afib today. He reports feeling like he has foam under my feet when walking and wonders if this could be from afib. He has been prescribed gabapentin for neuropathy but has not started it yet. He also does have lightheadedness on standing. He describes an off feeling in his chest but it is vague and difficult for him to put into words.   Today, he  denies symptoms of palpitations, chest pain, shortness of breath, orthopnea, PND, lower extremity edema, presyncope, syncope, snoring, daytime somnolence, bleeding, or neurologic sequela. The patient is tolerating medications without difficulties and is otherwise without complaint today.    Past Medical History:  Diagnosis Date   A-fib (HCC)    paf with NSVT on MCOT, on coumadin , echo 01/31/08-nl lv, mild aortic valve sclerosis    Aneurysm (HCC)    Anxiety    Atrial fibrillation (HCC)    Carotid bruit    doppler 05/17/12-mild plaque with no diameter reduction   Chronic anticoagulation    Chronic ITP (idiopathic thrombocytopenia) (HCC) 04/10/2012   Low grade thrombocytopenia consistent with chronic ITP not in need of therapy.    DM (diabetes mellitus) (HCC)    Dysrhythmia    afib   H/O cardiovascular stress test 01/31/08   no  ishcemia, ef 61%   History of kidney stones    HTN (hypertension)    Hypertension    ITP (idiopathic thrombocytopenic purpura)    Thrombocytopenia (HCC)     ROS- All systems are reviewed and negative except as per the HPI above  Physical Exam: Vitals:   03/05/24 0818  BP: (!) 140/60  Pulse: 92  Weight: 94.4 kg  Height: 6' 2 (1.88 m)    GEN: Well nourished, well developed in no acute distress CARDIAC: Irregularly irregular rate and rhythm, no murmurs, rubs, gallops RESPIRATORY:  Clear to auscultation without rales, wheezing or rhonchi  ABDOMEN: Soft, non-tender, non-distended EXTREMITIES:  No edema; No deformity    EKG today demonstrates Afib Vent. rate 92 BPM PR interval * ms QRS duration 100 ms QT/QTcB 378/467 ms   Echo 12/05/21 1. Left ventricular ejection fraction, by estimation, is 60 to 65%. The  left ventricle has normal function. The left ventricle has no regional  wall motion abnormalities. The left ventricular internal cavity size was  mildly dilated. Left ventricular diastolic function could not be evaluated.   2. Right ventricular systolic function is normal. The right ventricular  size is normal.   3. Right atrial size was mildly dilated.   4. The mitral valve is normal in structure. Mild mitral valve regurgitation. No evidence of mitral stenosis.   5. The aortic valve is tricuspid. Aortic valve regurgitation is not  visualized. Aortic valve sclerosis/calcification is present, without any  evidence of aortic stenosis.   6. The inferior vena cava is normal in  size with greater than 50%  respiratory variability, suggesting right atrial pressure of 3 mmHg.    CHA2DS2-VASc Score = 4  The patient's score is based upon: CHF History: 0 HTN History: 1 Diabetes History: 1 Stroke History: 0 Vascular Disease History: 0 Age Score: 2 Gender Score: 0       ASSESSMENT AND PLAN: Persistent Atrial Fibrillation (ICD10:  I48.19) The patient's CHA2DS2-VASc  score is 4, indicating a 4.8% annual risk of stroke.   Previously failed flecainide  due to prolonged PR Amiodarone  discontinued due to side effects. We revisited rate vs rhythm control including dofetilide, resuming amiodarone , and ablation. His QT is borderline for dofetilide and he does not want to go in the hospital. He is agreeable discussing ablation with EP. However, I'm not sure his symptoms are attributable to afib. Rate control may be most appropriate.  Continue carvedilol  6.25 mg BID (patient was taking only once daily) Continue warfarin  Secondary Hypercoagulable State (ICD10:  D68.69) The patient is at significant risk for stroke/thromboembolism based upon his CHA2DS2-VASc Score of 4.  Continue Warfarin (Coumadin ). No bleeding issues.    HTN Stable on current regimen   Follow up with EP to discuss rate vs rhythm control (ablation).    Jason Kicks PA-C [facility] [address] [place], [place] [address] [phone]

## 2024-03-10 ENCOUNTER — Ambulatory Visit: Attending: Cardiovascular Disease | Admitting: *Deleted

## 2024-03-10 DIAGNOSIS — I4819 Other persistent atrial fibrillation: Secondary | ICD-10-CM

## 2024-03-10 DIAGNOSIS — Z5181 Encounter for therapeutic drug level monitoring: Secondary | ICD-10-CM

## 2024-03-10 LAB — POCT INR: INR: 2.4 (ref 2.0–3.0)

## 2024-03-10 NOTE — Patient Instructions (Signed)
 On Amiodarone  200mg  daily Continue warfarin 1 tablet daily except 1/2 tablet on Mondays  Be consistent with greens/salad Repeat INR in 4 weeks

## 2024-03-10 NOTE — Progress Notes (Signed)
 INR 2.4 Please see anticoagulation encounter

## 2024-03-12 NOTE — Progress Notes (Signed)
 HPI   8.28.2025:   11/08/23: Jason Alvarez returns today in f/u. For his history of BPH with BOO and renal stones.  He has an elevated PSA but has declined biopsies.   He has stable LUTS with an IPSS of 15 and nocturia x 3 with a weak stream.   UA has 3+ glucose, 1+ protein and blood but only 0-2 RBC's.  His PSA is up to 10.3 with a 30% f/t ratio.  His PSA was up to 15 earlier this year.  His prostate was on the MRI in 2017.    05/17/23: Jason Alvarez returns today in f/u.   He has a history of BPH with BOO and renal stones with prior hematuria.   He had an elevated PSA with the last stable at 7.5 in 2/23. We decided to forgo further testing after last year.   His IPSS is 12 with a reduced stream.  He has minimal burning with voiding.  His UA has 3-10 RBC's which is chronic.  He has had no flank pain.  His Cr was 1.41 on10/30/24.  He was taken off of amiodarone  yesterday for weakness.  He remains on warfarin.  His PVR is minimal.   03/16/22: Jason Alvarez returns today in f/u.  He had a hematuria w/u last year and was found to have tiny renal stones and prostate enlargement.  He has still not tried the finasteride  or tamsulosin .  He has had some increased frequency over the last week.  His IPSS today is 20.  He has a reduced stream.  His UA today has 3-10 RBC's.  His last PSA was 7.5 in 2/23 which was stable.  He has 1+ glucose but is off of farxiga.   03/17/21: Jason Alvarez returns today in f/u from a CT hematuria study for microhematuria noted in April.  He was found to have punctate renal stones and a large prostate but no other significant GU findings.   He was given tamsulosin  at his last visit but he has never started that.  He has previously declined finasteride .   He has had a lot on his plate dealing with his wife.  His UA still has 3-10 RBC's.  His IPSS is stable at 17 with a weak stream and a sensation of incomplete emptying.  He has been started on Farxiga and has 2+ glucose.  His Cr was 1.1.     11/04/20: Jason Alvarez  returns today in f/u for his several year history of BPH with BOO and an elevated PSA. His PSA is 7.5 which is fairly stable.  It was 6.1 in 11/26/18.  It is 8.6 with a 34% f/t ratio on 12/04/19 which is in his usual range.  He had a prostate MRI in the fall of 2017 that was negative but his prostate was very large. He was given a script for finasteride  but he never started it.  His IPSS is 17 which is stable.  He has an episode of enuresis recently but that hasn't recurred.  He has no associated signs or symptoms.  Cr is 1.27 on 09/13/20.   Jason Alvarez has had occasional recurrent balanitis but it has responded to nystatin and hasn't had to use it recently.             ROS:  ROS:  A complete review of systems was performed.  All systems are negative except for pertinent findings as noted.   Review of Systems  HENT:  Positive for congestion.   Gastrointestinal:  Positive  for diarrhea.  Neurological:  Positive for weakness.  Endo/Heme/Allergies:  Bruises/bleeds easily.  Psychiatric/Behavioral:  Positive for depression.   All other systems reviewed and are negative.   Allergies  Allergen Reactions   Penicillins Other (See Comments)    Childhood allergy Has patient had a PCN reaction causing immediate rash, facial/tongue/throat swelling, SOB or lightheadedness with hypotension: Unknown Has patient had a PCN reaction causing severe rash involving mucus membranes or skin necrosis: Unknown Has patient had a PCN reaction that required hospitalization: Unknown Has patient had a PCN reaction occurring within the last 10 years: Unknown If all of the above answers are NO, then may proceed with Cephalosporin use.    Outpatient Encounter Medications as of 03/13/2024  Medication Sig   ALPRAZolam (XANAX) 1 MG tablet Taking 1 tablet by mouth 4 times daily   amLODipine  (NORVASC ) 10 MG tablet Take 1 tablet (10 mg total) by mouth daily.   carvedilol  (COREG ) 6.25 MG tablet Take 1 tablet (6.25 mg  total) by mouth 2 (two) times daily.   ciprofloxacin  (CIPRO ) 500 MG tablet Take 500 mg by mouth every morning.   diphenoxylate-atropine (LOMOTIL) 2.5-0.025 MG tablet Take by mouth as needed for diarrhea or loose stools.   escitalopram (LEXAPRO) 10 MG tablet Take 10 mg by mouth daily. (Patient not taking: Reported on 03/05/2024)   famotidine  (PEPCID ) 40 MG tablet Take 1 tablet (40 mg total) by mouth daily. (Patient not taking: Reported on 03/05/2024)   fluticasone (FLONASE) 50 MCG/ACT nasal spray Place into both nostrils as needed.   glipiZIDE-metformin (METAGLIP) 5-500 MG per tablet Take 1 tablet by mouth daily. Patient only taking one tablet daily   ofloxacin (FLOXIN) 0.3 % OTIC solution Place 5 drops into the left ear every 4 (four) hours.   olmesartan -hydrochlorothiazide (BENICAR  HCT) 40-25 MG tablet Take 1 tablet by mouth daily.   OneTouch Delica Lancets 33G MISC    ONETOUCH VERIO test strip 1 each 2 (two) times daily.   VITAMIN D , CHOLECALCIFEROL, PO Take 2,000 mcg by mouth every morning.   warfarin (COUMADIN ) 5 MG tablet TAKE 1/2 TO 1 TABLET BY MOUTH DAILY   No facility-administered encounter medications on file as of 03/13/2024.    Past Medical History:  Diagnosis Date   A-fib (HCC)    paf with NSVT on MCOT, on coumadin , echo 01/31/08-nl lv, mild aortic valve sclerosis    Aneurysm (HCC)    Anxiety    Atrial fibrillation (HCC)    Carotid bruit    doppler 05/17/12-mild plaque with no diameter reduction   Chronic anticoagulation    Chronic ITP (idiopathic thrombocytopenia) (HCC) 04/10/2012   Low grade thrombocytopenia consistent with chronic ITP not in need of therapy.    DM (diabetes mellitus) (HCC)    Dysrhythmia    afib   H/O cardiovascular stress test 01/31/08   no ishcemia, ef 61%   History of kidney stones    HTN (hypertension)    Hypertension    ITP (idiopathic thrombocytopenic purpura)    Thrombocytopenia (HCC)     Past Surgical History:  Procedure Laterality Date    CIRCUMCISION     COLONOSCOPY  06/25/2012   Procedure: COLONOSCOPY;  Surgeon: Oneil DELENA Budge, MD;  [facility];  Service: Gastroenterology;  Laterality: N/A;   COLONOSCOPY N/A 01/30/2017   Procedure: COLONOSCOPY;  Surgeon: Budge Oneil, MD;  [facility];  Service: Gastroenterology;  Laterality: N/A;   KNEE ARTHROSCOPY     right and left knee/1992 and 2000  POLYPECTOMY  01/30/2017   Procedure: POLYPECTOMY;  Surgeon: Mavis Anes, MD;  [facility]: [facility];  Service: Gastroenterology;;  colon   VASECTOMY      Social History   Socioeconomic History   Marital status: Married    Spouse name: Not on file   Number of children: Not on file   Years of education: Not on file   Highest education level: Not on file  Occupational History   Not on file  Tobacco Use   Smoking status: Never    Passive exposure: Never   Smokeless tobacco: Never   Tobacco comments:    Never smoke 10/18/22  Vaping Use   Vaping status: Never Used  Substance and Sexual Activity   Alcohol use: No   Drug use: No   Sexual activity: Not on file  Other Topics Concern   Not on file  Social History Narrative   Not on file   Social Drivers of Health   Financial Resource Strain: Not on file  Food Insecurity: Not on file  Transportation Needs: Not on file  Physical Activity: Not on file  Stress: Not on file  Social Connections: Not on file  Intimate Partner Violence: Not on file    Family History  Problem Relation Age of Onset   Prostate cancer Father    Cancer Sister        breast   Cancer Brother        lung   Melanoma Mother        Objective: There were no vitals filed for this visit.     Physical Exam Vitals reviewed.  Constitutional:      Appearance: Normal appearance.  Neurological:     Mental Status: He is alert.     Lab Results:  No results found for this or any previous visit (from the past 24 hours).      BMET No results for input(s): NA,  K, CL, CO2, GLUCOSE, BUN, CREATININE, CALCIUM in the last 72 hours.  PSA Lab Results  Component Value Date   PSA1 10.3 (H) 11/05/2023   PSA1 9.5 (H) 09/11/2023   PSA1 7.5 (H) 09/01/2021    PSA  Date Value Ref Range Status  12/04/2019 8.6  Final  11/26/2018 6.1  Final   No results found for: TESTOSTERONE  UA reviewed.   No results found for this or any previous visit (from the past 24 hours).       Studies/Results: No results found.       Garnette HERO Cherelle Midkiff 03/12/2024 Patient ID: [name], male   DOB: 15-Feb-1941, 83 y.o.   MRN: [id number]

## 2024-03-13 ENCOUNTER — Ambulatory Visit: Admitting: Urology

## 2024-03-13 VITALS — BP 144/74 | HR 87

## 2024-03-13 DIAGNOSIS — N138 Other obstructive and reflux uropathy: Secondary | ICD-10-CM

## 2024-03-13 DIAGNOSIS — N200 Calculus of kidney: Secondary | ICD-10-CM

## 2024-03-13 DIAGNOSIS — N401 Enlarged prostate with lower urinary tract symptoms: Secondary | ICD-10-CM

## 2024-03-13 DIAGNOSIS — R3912 Poor urinary stream: Secondary | ICD-10-CM

## 2024-03-13 DIAGNOSIS — R972 Elevated prostate specific antigen [PSA]: Secondary | ICD-10-CM

## 2024-03-13 NOTE — Progress Notes (Deleted)
 Bladder Scan completed today.  Patient can void prior to the bladder scan. Bladder scan result: 0  Performed By: Beatriz Stallion. CMA  Additional notes-

## 2024-03-14 LAB — URINALYSIS, ROUTINE W REFLEX MICROSCOPIC
Bilirubin, UA: NEGATIVE
Leukocytes,UA: NEGATIVE
Nitrite, UA: NEGATIVE
RBC, UA: NEGATIVE
Specific Gravity, UA: 1.025 (ref 1.005–1.030)
Urobilinogen, Ur: 1 mg/dL (ref 0.2–1.0)
pH, UA: 5.5 (ref 5.0–7.5)

## 2024-03-14 LAB — MICROSCOPIC EXAMINATION

## 2024-03-26 ENCOUNTER — Telehealth: Payer: Self-pay

## 2024-03-26 NOTE — Telephone Encounter (Signed)
 Returned the pt's call and LMOVM for the pt to return call ?

## 2024-03-27 ENCOUNTER — Encounter (HOSPITAL_BASED_OUTPATIENT_CLINIC_OR_DEPARTMENT_OTHER): Payer: Self-pay | Admitting: Family

## 2024-03-27 ENCOUNTER — Ambulatory Visit (INDEPENDENT_AMBULATORY_CARE_PROVIDER_SITE_OTHER): Admitting: Family

## 2024-03-27 VITALS — BP 127/69 | HR 80 | Ht 74.0 in | Wt 208.4 lb

## 2024-03-27 DIAGNOSIS — I4819 Other persistent atrial fibrillation: Secondary | ICD-10-CM

## 2024-03-27 DIAGNOSIS — D6859 Other primary thrombophilia: Secondary | ICD-10-CM | POA: Diagnosis not present

## 2024-03-27 DIAGNOSIS — I10 Essential (primary) hypertension: Secondary | ICD-10-CM | POA: Diagnosis not present

## 2024-03-27 MED ORDER — OLMESARTAN MEDOXOMIL-HCTZ 40-12.5 MG PO TABS: 1.0000 | ORAL_TABLET | Freq: Every day | ORAL | 1 refills | Status: DC

## 2024-03-27 NOTE — Patient Instructions (Addendum)
 Medication Instructions:   DECREASE YOUR OLMESARTAN -hydrochlorothiazide TO THE 40-12.5 MG TABLET--TAKE BY MOUTH ONCE DAILY      Follow-Up: Please follow up in _6_ months in [facility] with Dr. Raford, Reche Finder, NP or Allean Mink PharmD    Special Instructions:   PLEASE KEEP YOUR SCHEDULED APPOINTMENT WITH DR. NANCEY AT OUR [facility] LOCATION FOR NEXT FRIDAY 9/19--ADDRESS IS [address] [place], [address]

## 2024-03-27 NOTE — Progress Notes (Signed)
 [facility]pertension [facility] Assessment:    Date:  03/27/2024   ID:  Jason Alvarez, DOB November 10, 1940, MRN [id number]  PCP:  Bertell Satterfield, MD  Cardiologist:  None  Nephrologist: Cardiology APP: Reche GORMAN Finder, NP   Referring MD: Bertell Satterfield, MD   CC: Hypertension  History of Present Illness:    Jason Alvarez is a 83 y.o. male with a hx of persistent atrial fibrillation, hypertension here to follow up in the [facility].   Established with [facility] 08/08/2023 after referral from AFib Clinic.  Diagnosed with hypertension greater than 60 years ago around age 67 or 45.  Blood pressures at home are 130s-150s/60-80s.  No prior tobacco use.  He lost his wife to a stroke suddenly in November and had been eating some convenience foods.  Prior sleep study with no sleep apnea.  Ramipril  transitioned to Olmesartan  and later increased to 40mg .   Seen by AFib Clinic 08/16/23 and elected for rate control of his atrial fibrillation as previously failed flecainide  due to prolonged PR and felt poorly on amiodarone  due to balance side effects.  At visit 10/11/23 Toprol  transitioned to Coreg  6.25mg  BID. Medications consolidated olmesartan -HCTZ 40-25 mg daily and amlodipine  10 mg daily.  Unfortunately insurance would not cover triple combination tablet.  Presents today for follow up.  Most bothered by ongoing disequilibrium for which he has seen sports medicine, neurosurgery, [facility].  This occurs when walking.  No dizziness, near-syncope, syncope.  No claudication symptoms.  He notes he is only taking carvedilol  daily as he forgets evening dose.  Encouraged to take with his evening warfarin which he does take on a regular basis.  He remains concerned that his antihypertensives are contributing to his ongoing disequilibrium.  Given no significant hypotension by home BP readings low suspicion it is related.  His average BP over the last week on home readings  127/69 with average heart rate of 80 bpm.  No chest pain, exertional dyspnea, edema, orthopnea.  Previous antihypertensives: Ramipril  - switch to Olmesartan  Lasix  Metoprolol  - switch to Carvedilol   Past Medical History:  Diagnosis Date   A-fib (HCC)    paf with NSVT on MCOT, on coumadin , echo 01/31/08-nl lv, mild aortic valve sclerosis    Aneurysm (HCC)    Anxiety    Atrial fibrillation (HCC)    Carotid bruit    doppler 05/17/12-mild plaque with no diameter reduction   Chronic anticoagulation    Chronic ITP (idiopathic thrombocytopenia) (HCC) 04/10/2012   Low grade thrombocytopenia consistent with chronic ITP not in need of therapy.    DM (diabetes mellitus) (HCC)    Dysrhythmia    afib   H/O cardiovascular stress test 01/31/08   no ishcemia, ef 61%   History of kidney stones    HTN (hypertension)    Hypertension    ITP (idiopathic thrombocytopenic purpura)    Thrombocytopenia (HCC)     Past Surgical History:  Procedure Laterality Date   CIRCUMCISION     COLONOSCOPY  06/25/2012   Procedure: COLONOSCOPY;  Surgeon: Oneil DELENA Budge, MD;  [facility];  Service: Gastroenterology;  Laterality: N/A;   COLONOSCOPY N/A 01/30/2017   Procedure: COLONOSCOPY;  Surgeon: Budge Oneil, MD;  [facility];  Service: Gastroenterology;  Laterality: N/A;   KNEE ARTHROSCOPY     right and left knee/1992 and 2000   POLYPECTOMY  01/30/2017   Procedure: POLYPECTOMY;  Surgeon: Budge Oneil, MD;  [facility];  Service: Gastroenterology;;  colon   VASECTOMY      Current Medications: Current Meds  Medication Sig   ALPRAZolam (XANAX) 1 MG tablet Taking 1 tablet by mouth 4 times daily   amLODipine  (NORVASC ) 10 MG tablet Take 1 tablet (10 mg total) by mouth daily.   carvedilol  (COREG ) 6.25 MG tablet Take 1 tablet (6.25 mg total) by mouth 2 (two) times daily.   diphenoxylate-atropine (LOMOTIL) 2.5-0.025 MG tablet Take by mouth as needed for diarrhea or loose stools.    fluticasone (FLONASE) 50 MCG/ACT nasal spray Place into both nostrils as needed.   glipiZIDE-metformin (METAGLIP) 5-500 MG per tablet Take 1 tablet by mouth daily. Patient only taking one tablet daily   olmesartan -hydrochlorothiazide (BENICAR  HCT) 40-25 MG tablet Take 1 tablet by mouth daily.   OneTouch Delica Lancets 33G MISC    ONETOUCH VERIO test strip 1 each 2 (two) times daily.   warfarin (COUMADIN ) 5 MG tablet TAKE 1/2 TO 1 TABLET BY MOUTH DAILY     Allergies:   Penicillins   Social History   Socioeconomic History   Marital status: Married    Spouse name: Not on file   Number of children: Not on file   Years of education: Not on file   Highest education level: Not on file  Occupational History   Not on file  Tobacco Use   Smoking status: Never    Passive exposure: Never   Smokeless tobacco: Never   Tobacco comments:    Never smoke 10/18/22  Vaping Use   Vaping status: Never Used  Substance and Sexual Activity   Alcohol use: No   Drug use: No   Sexual activity: Not on file  Other Topics Concern   Not on file  Social History Narrative   Not on file   Social Drivers of Health   Financial Resource Strain: Not on file  Food Insecurity: Not on file  Transportation Needs: Not on file  Physical Activity: Not on file  Stress: Not on file  Social Connections: Not on file     Family History: The patient's family history includes Cancer in his brother and sister; Melanoma in his mother; Prostate cancer in his father.  ROS:   Please see the history of present illness.     All other systems reviewed and are negative.  EKGs/Labs/Other Studies Reviewed:          CHA2DS2-VASc Score = 4   This indicates a 4.8% annual risk of stroke. The patient's score is based upon: CHF History: 0 HTN History: 1 Diabetes History: 1 Stroke History: 0 Vascular Disease History: 0 Age Score: 2 Gender Score: 0      Recent Labs: 05/16/2023: ALT 15; Hemoglobin 11.2; Platelets  155; TSH 3.850 09/14/2023: BUN 24; Creatinine, Ser 1.35; Potassium 3.9; Sodium 143   Recent Lipid Panel    Component Value Date/Time   CHOL 163 04/08/2019 0758   TRIG 65 04/08/2019 0758   HDL 45 04/08/2019 0758   LDLCALC 105 (H) 04/08/2019 0758    Physical Exam:   VS:  BP (!) 146/98   Pulse 80   Ht 6' 2 (1.88 m)   Wt 208 lb 6.4 oz (94.5 kg)   SpO2 100%   BMI 26.76 kg/m  , BMI Body mass index is 26.76 kg/m.  Vitals:   03/27/24 0741  BP: (!) 146/98  Pulse: 80  Height: 6' 2 (1.88 m)  Weight: 208 lb 6.4 oz (94.5 kg)  SpO2: 100%  BMI (  Calculated): 26.75    GENERAL:  Well appearing HEENT: Pupils equal round and reactive, fundi not visualized, oral mucosa unremarkable NECK:  No jugular venous distention, waveform within normal limits, carotid upstroke brisk and symmetric, no bruits, no thyromegaly LYMPHATICS:  No cervical adenopathy LUNGS:  Clear to auscultation bilaterally HEART:  IRIR.  PMI not displaced or sustained,S1 and S2 within normal limits, no S3, no S4, no clicks, no rubs, no murmurs ABD:  Flat, positive bowel sounds normal in frequency in pitch, no bruits, no rebound, no guarding, no midline pulsatile mass, no hepatomegaly, no splenomegaly EXT:  2 plus pulses throughout, no edema, no cyanosis no clubbing SKIN:  No rashes no nodules NEURO:  Cranial nerves II through XII grossly intact, motor grossly intact throughout PSYCH:  Cognitively intact, oriented to person place and time  ASSESSMENT/PLAN:    HTN - BP at goal with average home BP 127/69.  Known element of whitecoat hypertension.  Due to persistent disequilibrium, will reduce Olmesartan -hydrochlorothiazide from 40-25mg  daily to 40-12.5mg  daily to assess for any improvement..  Continue Amlodipine  10 mg daily, carvedilol  6.25 mg twice daily.  Reiterated need to take Carvedilol  twice daily. Encouraged him to put his Carvedilol  next ot his Warfarin so he remembers it.  If BP remains uncontrolled could consider  further increasing carvedilol  in the future. Discussed to monitor BP at home at least 2 hours after medications and sitting for 5-10 minutes.  Secondary hypertension workup Prior normal TSH Prior sleep study no sleep apnea 08/16/23 renin-aldosterone not consistent with hyperaldosteronism 08/2021 CT normal adrenals  Persistent atrial fibrillation / Hypercoagulabe state - Upcoming visit with Dr. Nancey to discuss ablation vs rate control. Previously did not tolerate Flecainide  (PR prolongation) and Amiodarone  (side effects). CHA2DS2-VASc Score = 4 [CHF History: 0, HTN History: 1, Diabetes History: 1, Stroke History: 0, Vascular Disease History: 0, Age Score: 2, Gender Score: 0].  Therefore, the patient's annual risk of stroke is 4.8 %.    Continue Coumadin  per Coumadin  Clinic instructions. Denies bleeding complications.  Reports no palpitations, reports no significant exertional dyspnea.   DM2 - Continue to follow with PCP.   Disequilibrium - Persistent and worsened over the last year. Has been attributed to be multifactorial. Symptoms only notable when ambulating. No dizziness, near syncope, syncope. Known knee osteoarthritis, was recommended for PT which he again politely declines. Follows with [facility], reports no claudication symptoms. 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   Screening for Secondary Hypertension:     Relevant Labs/Studies:    Latest Ref Rng & Units 09/14/2023   10:03 AM 08/16/2023    9:32 AM 05/16/2023   10:10 AM  Basic Labs  Sodium 134 - 144 mmol/L 143  140  139   Potassium 3.5 - 5.2 mmol/L 3.9  3.9  3.6   Creatinine 0.76 - 1.27 mg/dL 8.64  8.43  8.58        Latest Ref Rng & Units 05/16/2023   10:16 AM 10/18/2022    8:20 AM  Thyroid    TSH 0.350 - 4.500 uIU/mL 3.850  3.968        Latest Ref Rng & Units 08/16/2023    10:11 AM  Renin/Aldosterone   Aldosterone 0.0 - 30.0 ng/dL 4.9                 Disposition:    FU with MD/APP/PharmD in 6 months  Medication Adjustments/Labs and Tests Ordered: Current medicines are reviewed at length with the patient today.  Concerns regarding medicines are outlined above.  No orders of the defined types were placed in this encounter.  No orders of the defined types were placed in this encounter.    Signed, Reche GORMAN Finder, NP  03/27/2024 8:06 AM    [facility]

## 2024-04-04 ENCOUNTER — Encounter: Payer: Self-pay | Admitting: Cardiovascular Disease

## 2024-04-04 ENCOUNTER — Ambulatory Visit: Attending: Cardiovascular Disease | Admitting: Cardiovascular Disease

## 2024-04-04 VITALS — BP 171/77 | HR 87 | Ht 74.0 in | Wt 208.0 lb

## 2024-04-04 DIAGNOSIS — I4819 Other persistent atrial fibrillation: Secondary | ICD-10-CM | POA: Diagnosis not present

## 2024-04-04 NOTE — Progress Notes (Signed)
 Electrophysiology Office Note:    Date:  04/04/2024   ID:  Jason Alvarez, DOB 1940/12/16, MRN [id number]  PCP:  Bertell Satterfield, MD   [facility] Providers Cardiologist:  None     Referring MD: Nellene Quita JONELLE, PA   History of Present Illness:    Jason Alvarez is a 83 y.o. male with a medical history significant for persistent atrial fibrillation, referred for rhythm management.      Discussed the use of AI scribe software for clinical note transcription with the patient, who gave verbal consent to proceed.  History of Present Illness Atrial fibrillation was first reported in 2009. Initially managed with flecainide , he experienced significant PR prolongation, leading to a switch to amiodarone , which was discontinued in October 2024 due to feeling unwell. In August 2024, a Zio monitor showed a 25% atrial fibrillation burden with a controlled rate. His heart rate is generally in the 80s. He denies dizziness or lightheadedness.  He takes carvedilol , prescribed at two doses per day, but often forgets the second dose at night. He also takes warfarin for stroke prevention.  He is experiencing significant emotional distress and grief following the recent loss of his wife. His son, who also has atrial fibrillation, has undergone cardioversion multiple times, raising his concern about stroke risk, especially after his wife passed away from a stroke in July 07, 2022.         Today, he reports that he feels well and has no complaints at present.  EKGs/Labs/Other Studies Reviewed Today:     Echocardiogram:  TTE Dec 05, 2021 LVEF 60 to 65%. Right atrial size was mildly dilated.   Monitors:  14 day monitor July 2024  -- my interpretation Sinus rhythm predominates, heart rate 42 to 83 bpm, average 56 bpm A-fib burden 25%.  Heart rate range 42 to 118 bpm, average 68 bpm   EKG:   EKG Interpretation Date/Time:  Friday April 04 2024 15:43:32 EDT Ventricular Rate:   85 PR Interval:    QRS Duration:  96 QT Interval:  390 QTC Calculation: 464 R Axis:   66  Text Interpretation: Atrial fibrillation with a competing junctional pacemaker When compared with ECG of 05-Mar-2024 08:29, No significant change was found Confirmed by Nancey Scotts [phone]) on 04/04/2024 3:47:01 PM     Physical Exam:    VS:  BP (!) 171/77 (BP Location: Left Arm, Patient Position: Sitting, Cuff Size: Large)   Pulse 87   Ht 6' 2 (1.88 m)   Wt 208 lb (94.3 kg)   SpO2 100%   BMI 26.71 kg/m     Wt Readings from Last 3 Encounters:  04/04/24 208 lb (94.3 kg)  03/27/24 208 lb 6.4 oz (94.5 kg)  03/05/24 208 lb 3.2 oz (94.4 kg)     GEN: Well nourished, well developed in no acute distress CARDIAC: iRRR, no murmurs, rubs, gallops RESPIRATORY:  Normal work of breathing MUSCULOSKELETAL: no edema    ASSESSMENT & PLAN:     Persistent atrial fibrillation Suspect longstanding persistent atrial fibrillation Minimally symptomatic Has failed amiodarone  Rates appear to be controlled We discussed ablation as a final option for rhythm control. Using a shared decision making approach, we opted not to proceed with ablation. We will label him as permanent AF an pursue a rate control approach  Hypertension He is going to resume twice daily dosing of carvedilol  6.25mg  BID  Secondary hypercoagulable Continue warfarin dosed per warfarin clinic    Signed, Tierra Divelbiss E Tishawna Larouche, MD  04/04/2024 4:23 PM    [facility]

## 2024-04-07 ENCOUNTER — Ambulatory Visit: Attending: Cardiovascular Disease | Admitting: *Deleted

## 2024-04-07 DIAGNOSIS — Z5181 Encounter for therapeutic drug level monitoring: Secondary | ICD-10-CM | POA: Diagnosis not present

## 2024-04-07 DIAGNOSIS — I4819 Other persistent atrial fibrillation: Secondary | ICD-10-CM

## 2024-04-07 LAB — POCT INR: INR: 2.4 (ref 2.0–3.0)

## 2024-04-07 NOTE — Patient Instructions (Signed)
 On Amiodarone  200mg  daily Continue warfarin 1 tablet daily except 1/2 tablet on Mondays  Be consistent with greens/salad Repeat INR in 6 weeks

## 2024-04-07 NOTE — Progress Notes (Signed)
 INR 2.4 Please see anticoagulation encounter

## 2024-04-30 ENCOUNTER — Encounter: Payer: Self-pay | Admitting: Internal Medicine

## 2024-05-07 ENCOUNTER — Ambulatory Visit: Admitting: Family Medicine

## 2024-05-18 ENCOUNTER — Other Ambulatory Visit: Payer: Self-pay | Admitting: Cardiovascular Disease

## 2024-05-19 ENCOUNTER — Ambulatory Visit: Attending: Cardiovascular Disease | Admitting: *Deleted

## 2024-05-19 DIAGNOSIS — I4819 Other persistent atrial fibrillation: Secondary | ICD-10-CM

## 2024-05-19 DIAGNOSIS — Z5181 Encounter for therapeutic drug level monitoring: Secondary | ICD-10-CM | POA: Diagnosis not present

## 2024-05-19 LAB — POCT INR: INR: 2.2 (ref 2.0–3.0)

## 2024-05-19 NOTE — Patient Instructions (Signed)
 On Amiodarone  200mg  daily Continue warfarin 1 tablet daily except 1/2 tablet on Mondays  Be consistent with greens/salad Repeat INR in 6 weeks

## 2024-05-19 NOTE — Telephone Encounter (Signed)
 Refill request for warfarin:  Last INR was 2.4 on 04/07/24 Next INR due 05/19/24 LOV was 04/04/24  Refill approved.

## 2024-05-19 NOTE — Progress Notes (Signed)
 INR 2.2; Please see anticoagulation encounter

## 2024-05-20 ENCOUNTER — Ambulatory Visit: Admitting: Family Medicine

## 2024-05-22 ENCOUNTER — Ambulatory Visit: Payer: Medicare HMO | Admitting: Urology

## 2024-06-11 ENCOUNTER — Other Ambulatory Visit (HOSPITAL_BASED_OUTPATIENT_CLINIC_OR_DEPARTMENT_OTHER): Payer: Self-pay | Admitting: Family

## 2024-06-18 ENCOUNTER — Telehealth: Payer: Self-pay | Admitting: Cardiovascular Disease

## 2024-06-18 NOTE — Telephone Encounter (Signed)
 Spoke with patient in regards to his health. Stated his BP 193/97, HR 99 at 5 this evening. Normal for him has been SBP 140's-150s. Pt also states he has only drank one cup of water  all day. Encouraged pt to drink water  and retake his blood pressure. Also states he only takes one of his heart pills, when he is supposed to be taking two. Advised pt to drink some fluids and to take his Carvedilol  tonight and recheck his blood pressure in about two hours. Gave 911 or ER precautions if he became symptomatic. Pt verbalizes understanding.

## 2024-06-18 NOTE — Telephone Encounter (Signed)
 Pt calling in to see if DR. Mealor has any concerns at the moment with his care. Please advise.

## 2024-06-19 ENCOUNTER — Encounter: Payer: Self-pay | Admitting: Cardiovascular Disease

## 2024-06-19 NOTE — Telephone Encounter (Signed)
 Error

## 2024-06-19 NOTE — Telephone Encounter (Signed)
 Spoke w patient.  Today is BP is 135/75, 89. We discussed that he needs to take medications as prescribed.  Still has not started to take the 2nd dose of carvedilol  but states he will start tonight.  He also is only drinking 3/4 - 1 glass of water  daily.  Asked him to increase to 5-6 glasses daily, since he is reporting decreased urine output.  He wonders if he is dehydrated or has a UTI.  He wants to have his urine checked or blood work.  I asked him to contact his PCP.  Also has urology and hasn't reached out to them yet.  He will contact both of them today.

## 2024-06-19 NOTE — Telephone Encounter (Signed)
 Pt calling back to follow up with concerns about BP and being dehydrated. Please advise.

## 2024-06-20 NOTE — Telephone Encounter (Signed)
 Thank you for taking care of Jason Alvarez, agree with plan to take medications as prescribed and follow up with PCP regarding urinary concerns.   Pamlea Finder S Ambriella Kitt, NP

## 2024-06-23 ENCOUNTER — Telehealth: Payer: Self-pay

## 2024-06-23 NOTE — Telephone Encounter (Signed)
 Pt called stating he is having intermittent burning with urination about once a week   Dysuria  Patient called with c/o dysuria x off and on for about 2 weeks  Pain: burning w/ urination (intermitten) Severity:4/10  Associated Signs and Symptoms:  Fever: no Chills: no Hematuria: always has had blood in urine pt states he never see it  Urgency: yes for years Frequency: no Hesitancy:yes enlarged prostate no pressure behind urine Incontinence: every once in awhile  Nausea: no Vomiting: no  Urologic History:  Any Recent Urologic Surgeries or Procedures:no Recurrent UTI's:no Cystitis: no  Prostatitis:no Kidney or Bladder Stones: yes Plan: Walk-in Clinic: no Appointment w/Physician: [no Lab visit scheduled for urine drop off: No Advice given: - pt was advised to increase his water  intake and try azo pt states he does believe he in dehydrated and will attempt to up his water  intake  Do you take on daily medications for UTI suppression No

## 2024-06-30 ENCOUNTER — Ambulatory Visit

## 2024-07-03 ENCOUNTER — Ambulatory Visit: Attending: Cardiovascular Disease

## 2024-07-03 DIAGNOSIS — Z5181 Encounter for therapeutic drug level monitoring: Secondary | ICD-10-CM | POA: Diagnosis not present

## 2024-07-03 DIAGNOSIS — I4819 Other persistent atrial fibrillation: Secondary | ICD-10-CM

## 2024-07-03 LAB — POCT INR: INR: 2.2 (ref 2.0–3.0)

## 2024-07-03 NOTE — Progress Notes (Signed)
 INR 2.2; Please see anticoagulation encounter

## 2024-07-03 NOTE — Patient Instructions (Signed)
 On Amiodarone  200mg  daily Continue warfarin 1 tablet daily except 1/2 tablet on Mondays  Be consistent with greens/salad Repeat INR in 6 weeks

## 2024-07-08 ENCOUNTER — Telehealth: Payer: Self-pay

## 2024-07-08 NOTE — Telephone Encounter (Signed)
 Pt left VM on triage line that he needed to make an appt for his leg and hand. I attempted to reach him and left a VM for him to return our call.

## 2024-08-18 ENCOUNTER — Ambulatory Visit

## 2024-08-21 ENCOUNTER — Telehealth: Payer: Self-pay | Admitting: Urology

## 2024-08-21 ENCOUNTER — Ambulatory Visit: Admitting: Gastroenterology

## 2024-08-21 NOTE — Telephone Encounter (Signed)
 Patient states having back pain on lower right side toward the spine, with occasional nausea, burring with urination  2-3 days then goes away I attempted to schedule patient for a UA drop off and patient stated that he would like to follow up with Dr. Brunetta at [facility] patient was advised to giver their office a call

## 2024-08-21 NOTE — Telephone Encounter (Signed)
 Having back pain and thinks he may have kidney infection. Would like to be seen asap

## 2024-08-25 ENCOUNTER — Ambulatory Visit

## 2024-10-03 ENCOUNTER — Ambulatory Visit: Admitting: Diagnostic Neuroimaging
